# Patient Record
Sex: Male | Born: 1935 | Race: Black or African American | Hispanic: No | Marital: Single | Smoking: Former smoker
Health system: Southern US, Community
[De-identification: ages and names within clinical notes are randomized; demographics above are authoritative.]

## PROBLEM LIST (undated history)

## (undated) DIAGNOSIS — I4891 Unspecified atrial fibrillation: Secondary | ICD-10-CM

## (undated) DIAGNOSIS — J449 Chronic obstructive pulmonary disease, unspecified: Secondary | ICD-10-CM

## (undated) DIAGNOSIS — K08109 Complete loss of teeth, unspecified cause, unspecified class: Secondary | ICD-10-CM

## (undated) DIAGNOSIS — IMO0001 Reserved for inherently not codable concepts without codable children: Secondary | ICD-10-CM

## (undated) DIAGNOSIS — N184 Chronic kidney disease, stage 4 (severe): Secondary | ICD-10-CM

## (undated) DIAGNOSIS — I2510 Atherosclerotic heart disease of native coronary artery without angina pectoris: Secondary | ICD-10-CM

## (undated) DIAGNOSIS — I5042 Chronic combined systolic (congestive) and diastolic (congestive) heart failure: Secondary | ICD-10-CM

## (undated) DIAGNOSIS — M1990 Unspecified osteoarthritis, unspecified site: Secondary | ICD-10-CM

## (undated) DIAGNOSIS — E119 Type 2 diabetes mellitus without complications: Secondary | ICD-10-CM

## (undated) DIAGNOSIS — N189 Chronic kidney disease, unspecified: Secondary | ICD-10-CM

## (undated) DIAGNOSIS — I219 Acute myocardial infarction, unspecified: Secondary | ICD-10-CM

## (undated) DIAGNOSIS — I499 Cardiac arrhythmia, unspecified: Secondary | ICD-10-CM

## (undated) DIAGNOSIS — I10 Essential (primary) hypertension: Secondary | ICD-10-CM

## (undated) DIAGNOSIS — Z862 Personal history of diseases of the blood and blood-forming organs and certain disorders involving the immune mechanism: Secondary | ICD-10-CM

## (undated) DIAGNOSIS — R609 Edema, unspecified: Secondary | ICD-10-CM

## (undated) DIAGNOSIS — M109 Gout, unspecified: Secondary | ICD-10-CM

## (undated) DIAGNOSIS — E7800 Pure hypercholesterolemia, unspecified: Secondary | ICD-10-CM

## (undated) DIAGNOSIS — Z972 Presence of dental prosthetic device (complete) (partial): Secondary | ICD-10-CM

## (undated) DIAGNOSIS — J45909 Unspecified asthma, uncomplicated: Secondary | ICD-10-CM

## (undated) HISTORY — PX: CARDIAC CATHETERIZATION: SHX172

## (undated) HISTORY — PX: RETINAL DETACHMENT SURGERY: SHX105

---

## 2006-11-16 ENCOUNTER — Inpatient Hospital Stay: Payer: Self-pay | Admitting: Internal Medicine

## 2006-11-16 ENCOUNTER — Other Ambulatory Visit: Payer: Self-pay

## 2007-08-02 ENCOUNTER — Inpatient Hospital Stay: Payer: Self-pay | Admitting: Internal Medicine

## 2007-08-02 ENCOUNTER — Other Ambulatory Visit: Payer: Self-pay

## 2007-12-14 ENCOUNTER — Ambulatory Visit: Payer: Self-pay | Admitting: Gastroenterology

## 2007-12-19 ENCOUNTER — Ambulatory Visit: Payer: Self-pay | Admitting: Gastroenterology

## 2008-01-12 ENCOUNTER — Inpatient Hospital Stay: Payer: Self-pay | Admitting: Internal Medicine

## 2008-01-12 ENCOUNTER — Other Ambulatory Visit: Payer: Self-pay

## 2008-03-19 ENCOUNTER — Ambulatory Visit: Payer: Self-pay | Admitting: Family Medicine

## 2008-03-27 ENCOUNTER — Emergency Department: Payer: Self-pay | Admitting: Emergency Medicine

## 2008-03-27 ENCOUNTER — Other Ambulatory Visit: Payer: Self-pay

## 2008-05-26 ENCOUNTER — Other Ambulatory Visit: Payer: Self-pay

## 2008-05-26 ENCOUNTER — Inpatient Hospital Stay: Payer: Self-pay | Admitting: Internal Medicine

## 2008-12-15 ENCOUNTER — Inpatient Hospital Stay: Payer: Self-pay | Admitting: Internal Medicine

## 2008-12-18 ENCOUNTER — Ambulatory Visit: Payer: Self-pay | Admitting: Cardiology

## 2009-01-08 ENCOUNTER — Inpatient Hospital Stay: Payer: Self-pay | Admitting: *Deleted

## 2009-03-06 ENCOUNTER — Ambulatory Visit: Payer: Self-pay | Admitting: Family Medicine

## 2009-05-26 ENCOUNTER — Ambulatory Visit: Payer: Self-pay | Admitting: Family Medicine

## 2012-07-18 ENCOUNTER — Ambulatory Visit: Payer: Self-pay | Admitting: Family Medicine

## 2013-09-07 ENCOUNTER — Emergency Department: Payer: Self-pay | Admitting: Emergency Medicine

## 2013-10-18 ENCOUNTER — Ambulatory Visit: Payer: Self-pay | Admitting: Ophthalmology

## 2013-10-18 LAB — POTASSIUM: Potassium: 4.2 mmol/L (ref 3.5–5.1)

## 2013-10-24 ENCOUNTER — Ambulatory Visit: Payer: Self-pay | Admitting: Ophthalmology

## 2015-01-04 NOTE — Op Note (Signed)
PATIENT NAME:  [name] [name], [name] [name] MR#:  [id number] DATE OF BIRTH:  Apr 28, 1935  DATE OF PROCEDURE:  10/24/2013  PROCEDURES PERFORMED: 1.  Pars plana vitrectomy of the right eye.  2.  Panretinal photocoagulation of the right eye.   PREOPERATIVE DIAGNOSIS: 1.  Dense vitreous hemorrhage.   POSTOPERATIVE DIAGNOSES: 1.  Dense vitreous hemorrhage.  2.  Branch retinal vein occlusion.  3.  Proliferative retinopathy.   PRIMARY SURGEON:  Aron Baba M.D.   ANESTHESIA: Retrobulbar block of the right eye with monitored anesthesia care.   COMPLICATIONS: None.   ESTIMATED BLOOD LOSS: Less than 1 mL.   INDICATIONS FOR PROCEDURE: The patient presented to my office with sudden decreased vision in the right eye. Examination revealed a dense vitreous hemorrhage. B-scan ultrasound showed no signs of retinal detachment. Risks, benefits and alternatives of the above procedure were discussed, and the patient wished to proceed.   DETAILS OF PROCEDURE: After informed consent was obtained, the patient was brought into the operative suite at [facility]. The patient was placed in supine position and was given a small dose of Alfenta and a retrobulbar block was performed on the right eye by the primary surgeon without any complications. The right eye was prepped and draped in a sterile manner. After lid speculum was inserted, a 25-gauge trocar was placed inferotemporally through displaced conjunctiva in an oblique fashion 4 mm beyond the limbus. The infusion cannula was turned on and inserted through the trocar and secured in position with Steri-Strips. Two more trocars were placed in a similar fashion superotemporally and superonasally. The vitreous cutter and light pipe were introduced in the eye and a core vitrectomy was performed. The posterior retinal heme was vacuumed and peripheral vitreous was trimmed for 360 degrees. Extreme care was taken to avoid hitting the crystalline lens. After  all the blood was cleared, the area of proliferative retinopathy was identified at approximately 11:30.  Multiple branch vessels were noted to be occluded. The endolaser was introduced and 360 degrees of panretinal photocoagulation was performed. No signs of any breaks, tears or retinal detachment could be identified on examination for 360 degrees. A partial air-fluid exchange was performed. Each of the trocars were removed. The superotemporal wound was closed using transconjunctival 6-0 plain gut. The eye was pressurized with filtered air to a pressure of 15 mmHg; 5 mg of dexamethasone was given into the inferior fornix. Each of the wound sites were noted to be airtight. The lid speculum was removed and the eye was cleaned. TobraDex was placed on the eye and a patch and shield were placed over the eye. The patient was taken to postanesthesia care with instructions to remain head up.      ____________________________ Ignacia Felling. Esperanza Madrazo, MD mfa:dmm D: 10/24/2013 09:11:00 ET T: 10/24/2013 09:34:30 ET JOB#: [id number]  cc: Ignacia Felling. Champ Mungo, MD, <Dictator> Cline Cools MD ELECTRONICALLY SIGNED 11/17/2013 8:59

## 2016-01-22 NOTE — Discharge Instructions (Signed)

## 2016-01-28 ENCOUNTER — Ambulatory Visit: Payer: Medicare Other | Admitting: Anesthesiology

## 2016-01-28 ENCOUNTER — Ambulatory Visit
Admission: RE | Admit: 2016-01-28 | Discharge: 2016-01-28 | Disposition: A | Discharge status: Home / Self Care | Payer: Medicare Other | Source: Ambulatory Visit | Attending: Ophthalmology | Admitting: Ophthalmology

## 2016-01-28 ENCOUNTER — Encounter
Admission: RE | Disposition: A | Discharge status: Home / Self Care | Payer: Self-pay | Source: Ambulatory Visit | Attending: Ophthalmology

## 2016-01-28 DIAGNOSIS — I2510 Atherosclerotic heart disease of native coronary artery without angina pectoris: Secondary | ICD-10-CM | POA: Diagnosis not present

## 2016-01-28 DIAGNOSIS — I252 Old myocardial infarction: Secondary | ICD-10-CM | POA: Diagnosis not present

## 2016-01-28 DIAGNOSIS — H2511 Age-related nuclear cataract, right eye: Secondary | ICD-10-CM | POA: Diagnosis not present

## 2016-01-28 DIAGNOSIS — M109 Gout, unspecified: Secondary | ICD-10-CM | POA: Insufficient documentation

## 2016-01-28 DIAGNOSIS — Z79899 Other long term (current) drug therapy: Secondary | ICD-10-CM | POA: Diagnosis not present

## 2016-01-28 DIAGNOSIS — E119 Type 2 diabetes mellitus without complications: Secondary | ICD-10-CM | POA: Diagnosis not present

## 2016-01-28 DIAGNOSIS — E7800 Pure hypercholesterolemia, unspecified: Secondary | ICD-10-CM | POA: Diagnosis not present

## 2016-01-28 DIAGNOSIS — I499 Cardiac arrhythmia, unspecified: Secondary | ICD-10-CM | POA: Insufficient documentation

## 2016-01-28 DIAGNOSIS — I10 Essential (primary) hypertension: Secondary | ICD-10-CM | POA: Insufficient documentation

## 2016-01-28 DIAGNOSIS — I4891 Unspecified atrial fibrillation: Secondary | ICD-10-CM | POA: Diagnosis not present

## 2016-01-28 DIAGNOSIS — Z955 Presence of coronary angioplasty implant and graft: Secondary | ICD-10-CM | POA: Diagnosis not present

## 2016-01-28 DIAGNOSIS — J449 Chronic obstructive pulmonary disease, unspecified: Secondary | ICD-10-CM | POA: Diagnosis not present

## 2016-01-28 HISTORY — DX: Essential (primary) hypertension: I10

## 2016-01-28 HISTORY — DX: Chronic kidney disease, unspecified: N18.9

## 2016-01-28 HISTORY — DX: Type 2 diabetes mellitus without complications: E11.9

## 2016-01-28 HISTORY — PX: CATARACT EXTRACTION W/PHACO: SHX586

## 2016-01-28 HISTORY — DX: Unspecified osteoarthritis, unspecified site: M19.90

## 2016-01-28 HISTORY — DX: Chronic obstructive pulmonary disease, unspecified: J44.9

## 2016-01-28 HISTORY — DX: Edema, unspecified: R60.9

## 2016-01-28 HISTORY — DX: Cardiac arrhythmia, unspecified: I49.9

## 2016-01-28 HISTORY — DX: Unspecified asthma, uncomplicated: J45.909

## 2016-01-28 HISTORY — DX: Unspecified atrial fibrillation: I48.91

## 2016-01-28 HISTORY — DX: Atherosclerotic heart disease of native coronary artery without angina pectoris: I25.10

## 2016-01-28 HISTORY — DX: Presence of dental prosthetic device (complete) (partial): Z97.2

## 2016-01-28 HISTORY — DX: Acute myocardial infarction, unspecified: I21.9

## 2016-01-28 HISTORY — DX: Reserved for inherently not codable concepts without codable children: IMO0001

## 2016-01-28 HISTORY — DX: Complete loss of teeth, unspecified cause, unspecified class: K08.109

## 2016-01-28 HISTORY — DX: Pure hypercholesterolemia, unspecified: E78.00

## 2016-01-28 LAB — GLUCOSE, CAPILLARY
GLUCOSE-CAPILLARY: 70 mg/dL (ref 65–99)
Glucose-Capillary: 105 mg/dL — ABNORMAL HIGH (ref 65–99)

## 2016-01-28 SURGERY — PHACOEMULSIFICATION, CATARACT, WITH IOL INSERTION
Anesthesia: Monitor Anesthesia Care | Laterality: Right | Wound class: Clean

## 2016-01-28 MED ORDER — POVIDONE-IODINE 5 % OP SOLN: 1.0000 "application " | OPHTHALMIC | Status: DC | PRN

## 2016-01-28 MED ORDER — BRIMONIDINE TARTRATE 0.2 % OP SOLN
OPHTHALMIC | Status: DC | PRN
  Administered 2016-01-28: 1 [drp] via OPHTHALMIC

## 2016-01-28 MED ORDER — ARMC OPHTHALMIC DILATING GEL
1.0000 "application " | OPHTHALMIC | Status: DC | PRN
  Administered 2016-01-28 (×2): 1 via OPHTHALMIC

## 2016-01-28 MED ORDER — BSS IO SOLN
INTRAOCULAR | Status: DC | PRN
  Administered 2016-01-28: 82 mL via OPHTHALMIC
  Administered 2016-01-28: 10:00:00 via OPHTHALMIC

## 2016-01-28 MED ORDER — POVIDONE-IODINE 5 % OP SOLN
1.0000 "application " | OPHTHALMIC | Status: DC | PRN
  Administered 2016-01-28: 1 via OPHTHALMIC

## 2016-01-28 MED ORDER — TETRACAINE HCL 0.5 % OP SOLN: 1.0000 [drp] | OPHTHALMIC | Status: DC | PRN

## 2016-01-28 MED ORDER — DEXTROSE 50 % IV SOLN
12.5000 g | Freq: Once | INTRAVENOUS | Status: AC
  Administered 2016-01-28: 12.5 g via INTRAVENOUS

## 2016-01-28 MED ORDER — TETRACAINE HCL 0.5 % OP SOLN
1.0000 [drp] | OPHTHALMIC | Status: DC | PRN
  Administered 2016-01-28: 1 [drp] via OPHTHALMIC

## 2016-01-28 MED ORDER — MIDAZOLAM HCL 2 MG/2ML IJ SOLN
INTRAMUSCULAR | Status: DC | PRN
  Administered 2016-01-28: 1 mg via INTRAVENOUS

## 2016-01-28 MED ORDER — NA HYALUR & NA CHOND-NA HYALUR 0.4-0.35 ML IO KIT
PACK | INTRAOCULAR | Status: DC | PRN
Start: 2016-01-28 — End: 2016-01-28
  Administered 2016-01-28: 1 mL via INTRAOCULAR

## 2016-01-28 MED ORDER — CEFUROXIME OPHTHALMIC INJECTION 1 MG/0.1 ML
INJECTION | OPHTHALMIC | Status: DC | PRN
  Administered 2016-01-28: 0.1 mL via OPHTHALMIC

## 2016-01-28 MED ORDER — ARMC OPHTHALMIC DILATING GEL: 1.0000 "application " | OPHTHALMIC | Status: DC | PRN

## 2016-01-28 MED ORDER — TIMOLOL MALEATE 0.5 % OP SOLN
OPHTHALMIC | Status: DC | PRN
  Administered 2016-01-28: 1 [drp] via OPHTHALMIC

## 2016-01-28 MED ORDER — BALANCED SALT IO SOLN
INTRAOCULAR | Status: DC | PRN
  Administered 2016-01-28: 1 mL via OPHTHALMIC

## 2016-01-28 MED ORDER — FENTANYL CITRATE (PF) 100 MCG/2ML IJ SOLN
INTRAMUSCULAR | Status: DC | PRN
  Administered 2016-01-28: 50 ug via INTRAVENOUS

## 2016-01-28 SURGICAL SUPPLY — 21 items
CANNULA ANT/CHMB 27GA (MISCELLANEOUS) ×3 IMPLANT
CARTRIDGE ABBOTT (MISCELLANEOUS) IMPLANT
GLOVE SURG LX 7.5 STRW (GLOVE) ×2
GLOVE SURG LX STRL 7.5 STRW (GLOVE) ×1 IMPLANT
GLOVE SURG TRIUMPH 8.0 PF LTX (GLOVE) ×3 IMPLANT
GOWN STRL REUS W/ TWL LRG LVL3 (GOWN DISPOSABLE) ×2 IMPLANT
GOWN STRL REUS W/TWL LRG LVL3 (GOWN DISPOSABLE) ×4
LENS IOL TECNIS ITEC 22.5 (Intraocular Lens) ×3 IMPLANT
MARKER SKIN DUAL TIP RULER LAB (MISCELLANEOUS) ×3 IMPLANT
NDL RETROBULBAR .5 NSTRL (NEEDLE) IMPLANT
PACK CATARACT BRASINGTON (MISCELLANEOUS) ×3 IMPLANT
PACK EYE AFTER SURG (MISCELLANEOUS) ×3 IMPLANT
PACK OPTHALMIC (MISCELLANEOUS) ×3 IMPLANT
RING MALYGIN 7.0 (MISCELLANEOUS) ×3 IMPLANT
SUT ETHILON 10-0 CS-B-6CS-B-6 (SUTURE)
SUT VICRYL  9 0 (SUTURE)
SUT VICRYL 9 0 (SUTURE) IMPLANT
SUTURE EHLN 10-0 CS-B-6CS-B-6 (SUTURE) IMPLANT
SYR TB 1ML LUER SLIP (SYRINGE) ×3 IMPLANT
WATER STERILE IRR 250ML POUR (IV SOLUTION) ×3 IMPLANT
WIPE NON LINTING 3.25X3.25 (MISCELLANEOUS) ×3 IMPLANT

## 2016-01-28 NOTE — Op Note (Signed)
OPERATIVE NOTE  [name] [name] [id number] 01/28/2016   PREOPERATIVE DIAGNOSIS:    Nuclear Sclerotic Cataract Right eye with miotic pupil.        H25.11  POSTOPERATIVE DIAGNOSIS: Nuclear Sclerotic Cataract Right eye with miotic pupil.          PROCEDURE:  Phacoemusification with posterior chamber intraocular lens placement of the right eye which required pupil stretching with the Malyugin pupil expansion device.  LENS:   Implant Name Type Inv. Item Serial No. Manufacturer Lot No. LRB No. Used  LENS IOL DIOP 22.5 - [id number] Intraocular Lens LENS IOL DIOP 22.5 [id number]   Right 1       ULTRASOUND TIME: 20 % of 2 minutes 11 seconds, CDE 26.2  SURGEON:  Deirdre Evener, MD   ANESTHESIA:  Topical with tetracaine drops and 2% Xylocaine jelly, augmented with 1% preservative-free intracameral lidocaine.   COMPLICATIONS:  None.   DESCRIPTION OF PROCEDURE:  The patient was identified in the holding room and transported to the operating room and placed in the supine position under the operating microscope. Theright eye was identified as the operative eye and it was prepped and draped in the usual sterile ophthalmic fashion.   A 1 millimeter clear-corneal paracentesis was made at the 12:00 position.  0.5 ml of preservative-free 1% lidocaine was injected into the anterior chamber. The anterior chamber was filled with Viscoat viscoelastic.  A 2.4 millimeter keratome was used to make a near-clear corneal incision at the 9:00 position. A Malyugin pupil expander was then placed through the main incision and into the anterior chamber of the eye.  The edge of the iris was secured on the lip of the pupil expander and it was released, thereby expanding the pupil to approximately 8 millimeters for completion of the cataract surgery.  Additional Viscoat was placed in the anterior chamber.  A cystotome and capsulorrhexis forceps were used to make a curvilinear capsulorrhexis.   Balanced salt  solution was used to hydrodissect and hydrodelineate the lens nucleus.   Phacoemulsification was used in stop and chop fashion to remove the lens, nucleus and epinucleus.  The remaining cortex was aspirated using the irrigation aspiration handpiece.  Additional Provisc was placed into the eye to distend the capsular bag for lens placement.  A lens was then injected into the capsular bag.  The pupil expanding ring was removed using a Kuglen hook and insertion device. The remaining viscoelastic was aspirated from the capsular bag and the anterior chamber.  The anterior chamber was filled with balanced salt solution to inflate to a physiologic pressure.  Wounds were hydrated with balanced salt solution.  The anterior chamber was inflated to a physiologic pressure with balanced salt solution.  No wound leaks were noted.Cefuroxime 0.1 ml of a 10mg /ml solution was injected into the anterior chamber for a dose of 1 mg of intracameral antibiotic at the completion of the case. Timolol and Brimonidine drops were applied to the eye.  The patient was taken to the recovery room in stable condition without complications of anesthesia or surgery.  [name] 01/28/2016, 10:21 AM

## 2016-01-28 NOTE — Transfer of Care (Signed)
Immediate Anesthesia Transfer of Care Note  Patient: [name] [name]  Procedure(s) Performed: Procedure(s) with comments: CATARACT EXTRACTION PHACO AND INTRAOCULAR LENS PLACEMENT (IOC) right eye (Right) - DIABETIC-oral med MALYUGIN  Patient Location: PACU  Anesthesia Type: MAC  Level of Consciousness: awake, alert  and patient cooperative  Airway and Oxygen Therapy: Patient Spontanous Breathing and Patient connected to supplemental oxygen  Post-op Assessment: Post-op Vital signs reviewed, Patient's Cardiovascular Status Stable, Respiratory Function Stable, Patent Airway and No signs of Nausea or vomiting  Post-op Vital Signs: Reviewed and stable  Complications: No apparent anesthesia complications

## 2016-01-28 NOTE — Anesthesia Preprocedure Evaluation (Signed)
Anesthesia Evaluation  Patient identified by MRN, date of birth, ID band Patient awake    Reviewed: Allergy & Precautions, H&P , NPO status , Patient's Chart, lab work & pertinent test results  Airway Mallampati: II  TM Distance: >3 FB Neck ROM: full    Dental   Pulmonary shortness of breath, asthma , COPD, former smoker,    breath sounds clear to auscultation       Cardiovascular hypertension, + CAD and + Past MI  + dysrhythmias  Rhythm:irregular     Neuro/Psych    GI/Hepatic   Endo/Other  diabetes  Renal/GU Renal disease     Musculoskeletal   Abdominal   Peds  Hematology   Anesthesia Other Findings   Reproductive/Obstetrics                             Anesthesia Physical Anesthesia Plan  ASA: III  Anesthesia Plan: MAC   Post-op Pain Management:    Induction:   Airway Management Planned:   Additional Equipment:   Intra-op Plan:   Post-operative Plan:   Informed Consent: I have reviewed the patients History and Physical, chart, labs and discussed the procedure including the risks, benefits and alternatives for the proposed anesthesia with the patient or authorized representative who has indicated his/her understanding and acceptance.     Plan Discussed with: CRNA  Anesthesia Plan Comments:         Anesthesia Quick Evaluation

## 2016-01-28 NOTE — Anesthesia Procedure Notes (Signed)
Procedure Name: MAC Performed by: Keyvon Herter Pre-anesthesia Checklist: Patient identified, Emergency Drugs available, Suction available, Timeout performed and Patient being monitored Patient Re-evaluated:Patient Re-evaluated prior to inductionOxygen Delivery Method: Nasal cannula Placement Confirmation: positive ETCO2       

## 2016-01-28 NOTE — Anesthesia Postprocedure Evaluation (Signed)
Anesthesia Post Note  Patient: [name] [name]  Procedure(s) Performed: Procedure(s) (LRB): CATARACT EXTRACTION PHACO AND INTRAOCULAR LENS PLACEMENT (IOC) right eye (Right)  Patient location during evaluation: PACU Anesthesia Type: MAC Level of consciousness: awake and alert Pain management: pain level controlled Vital Signs Assessment: post-procedure vital signs reviewed and stable Respiratory status: spontaneous breathing, nonlabored ventilation, respiratory function stable and patient connected to nasal cannula oxygen Cardiovascular status: stable and blood pressure returned to baseline Anesthetic complications: no    Durene Fruits

## 2016-01-28 NOTE — H&P (Signed)
  The History and Physical notes are on paper, have been signed, and are to be scanned. The patient remains stable and unchanged from the H&P.   Previous H&P reviewed, patient examined, and there are no changes.  Linh Hedberg 01/28/2016 9:06 AM

## 2016-01-29 ENCOUNTER — Encounter: Payer: Self-pay | Admitting: Ophthalmology

## 2019-08-28 ENCOUNTER — Emergency Department: Payer: Medicare Other

## 2019-08-28 ENCOUNTER — Emergency Department
Admission: EM | Admit: 2019-08-28 | Discharge: 2019-08-28 | Disposition: A | Discharge status: Other Acute Inpatient Hospital | Payer: Medicare Other | Attending: Emergency Medicine | Admitting: Emergency Medicine

## 2019-08-28 ENCOUNTER — Inpatient Hospital Stay (HOSPITAL_COMMUNITY)
Admission: EM | Admit: 2019-08-28 | Discharge: 2019-09-06 | DRG: 286 | Disposition: A | Discharge status: Home / Self Care | Payer: Medicare Other | Source: Other Acute Inpatient Hospital | Attending: Internal Medicine | Admitting: Internal Medicine

## 2019-08-28 ENCOUNTER — Other Ambulatory Visit: Payer: Self-pay

## 2019-08-28 DIAGNOSIS — I2510 Atherosclerotic heart disease of native coronary artery without angina pectoris: Secondary | ICD-10-CM | POA: Diagnosis present

## 2019-08-28 DIAGNOSIS — T68XXXA Hypothermia, initial encounter: Secondary | ICD-10-CM

## 2019-08-28 DIAGNOSIS — Z7984 Long term (current) use of oral hypoglycemic drugs: Secondary | ICD-10-CM

## 2019-08-28 DIAGNOSIS — F17220 Nicotine dependence, chewing tobacco, uncomplicated: Secondary | ICD-10-CM | POA: Insufficient documentation

## 2019-08-28 DIAGNOSIS — E7800 Pure hypercholesterolemia, unspecified: Secondary | ICD-10-CM | POA: Diagnosis present

## 2019-08-28 DIAGNOSIS — Z7902 Long term (current) use of antithrombotics/antiplatelets: Secondary | ICD-10-CM | POA: Insufficient documentation

## 2019-08-28 DIAGNOSIS — I129 Hypertensive chronic kidney disease with stage 1 through stage 4 chronic kidney disease, or unspecified chronic kidney disease: Secondary | ICD-10-CM | POA: Insufficient documentation

## 2019-08-28 DIAGNOSIS — N183 Chronic kidney disease, stage 3 unspecified: Secondary | ICD-10-CM | POA: Diagnosis not present

## 2019-08-28 DIAGNOSIS — I4891 Unspecified atrial fibrillation: Secondary | ICD-10-CM

## 2019-08-28 DIAGNOSIS — J449 Chronic obstructive pulmonary disease, unspecified: Secondary | ICD-10-CM | POA: Insufficient documentation

## 2019-08-28 DIAGNOSIS — J9601 Acute respiratory failure with hypoxia: Secondary | ICD-10-CM

## 2019-08-28 DIAGNOSIS — E1122 Type 2 diabetes mellitus with diabetic chronic kidney disease: Secondary | ICD-10-CM | POA: Insufficient documentation

## 2019-08-28 DIAGNOSIS — I4510 Unspecified right bundle-branch block: Secondary | ICD-10-CM | POA: Diagnosis present

## 2019-08-28 DIAGNOSIS — R64 Cachexia: Secondary | ICD-10-CM | POA: Diagnosis present

## 2019-08-28 DIAGNOSIS — E872 Acidosis: Secondary | ICD-10-CM | POA: Diagnosis present

## 2019-08-28 DIAGNOSIS — Z87891 Personal history of nicotine dependence: Secondary | ICD-10-CM

## 2019-08-28 DIAGNOSIS — I5082 Biventricular heart failure: Secondary | ICD-10-CM | POA: Diagnosis not present

## 2019-08-28 DIAGNOSIS — E876 Hypokalemia: Secondary | ICD-10-CM | POA: Diagnosis present

## 2019-08-28 DIAGNOSIS — I130 Hypertensive heart and chronic kidney disease with heart failure and stage 1 through stage 4 chronic kidney disease, or unspecified chronic kidney disease: Secondary | ICD-10-CM | POA: Diagnosis present

## 2019-08-28 DIAGNOSIS — I259 Chronic ischemic heart disease, unspecified: Secondary | ICD-10-CM | POA: Diagnosis not present

## 2019-08-28 DIAGNOSIS — R680 Hypothermia, not associated with low environmental temperature: Secondary | ICD-10-CM | POA: Insufficient documentation

## 2019-08-28 DIAGNOSIS — Z7982 Long term (current) use of aspirin: Secondary | ICD-10-CM | POA: Diagnosis not present

## 2019-08-28 DIAGNOSIS — Z20828 Contact with and (suspected) exposure to other viral communicable diseases: Secondary | ICD-10-CM | POA: Insufficient documentation

## 2019-08-28 DIAGNOSIS — I252 Old myocardial infarction: Secondary | ICD-10-CM

## 2019-08-28 DIAGNOSIS — A419 Sepsis, unspecified organism: Secondary | ICD-10-CM | POA: Insufficient documentation

## 2019-08-28 DIAGNOSIS — I472 Ventricular tachycardia: Secondary | ICD-10-CM | POA: Diagnosis present

## 2019-08-28 DIAGNOSIS — I361 Nonrheumatic tricuspid (valve) insufficiency: Secondary | ICD-10-CM | POA: Diagnosis not present

## 2019-08-28 DIAGNOSIS — I428 Other cardiomyopathies: Secondary | ICD-10-CM | POA: Diagnosis present

## 2019-08-28 DIAGNOSIS — I340 Nonrheumatic mitral (valve) insufficiency: Secondary | ICD-10-CM | POA: Diagnosis not present

## 2019-08-28 DIAGNOSIS — I4892 Unspecified atrial flutter: Secondary | ICD-10-CM

## 2019-08-28 DIAGNOSIS — R4182 Altered mental status, unspecified: Secondary | ICD-10-CM | POA: Insufficient documentation

## 2019-08-28 DIAGNOSIS — N179 Acute kidney failure, unspecified: Secondary | ICD-10-CM

## 2019-08-28 DIAGNOSIS — Z95828 Presence of other vascular implants and grafts: Secondary | ICD-10-CM

## 2019-08-28 DIAGNOSIS — I471 Supraventricular tachycardia: Secondary | ICD-10-CM | POA: Diagnosis present

## 2019-08-28 DIAGNOSIS — R570 Cardiogenic shock: Secondary | ICD-10-CM | POA: Diagnosis present

## 2019-08-28 DIAGNOSIS — I5043 Acute on chronic combined systolic (congestive) and diastolic (congestive) heart failure: Secondary | ICD-10-CM | POA: Diagnosis present

## 2019-08-28 DIAGNOSIS — N1832 Chronic kidney disease, stage 3b: Secondary | ICD-10-CM

## 2019-08-28 DIAGNOSIS — Z79899 Other long term (current) drug therapy: Secondary | ICD-10-CM | POA: Diagnosis not present

## 2019-08-28 DIAGNOSIS — N170 Acute kidney failure with tubular necrosis: Secondary | ICD-10-CM | POA: Diagnosis present

## 2019-08-28 DIAGNOSIS — F17211 Nicotine dependence, cigarettes, in remission: Secondary | ICD-10-CM | POA: Diagnosis not present

## 2019-08-28 DIAGNOSIS — M19019 Primary osteoarthritis, unspecified shoulder: Secondary | ICD-10-CM | POA: Diagnosis present

## 2019-08-28 DIAGNOSIS — Z8673 Personal history of transient ischemic attack (TIA), and cerebral infarction without residual deficits: Secondary | ICD-10-CM

## 2019-08-28 DIAGNOSIS — N1831 Chronic kidney disease, stage 3a: Secondary | ICD-10-CM | POA: Diagnosis present

## 2019-08-28 DIAGNOSIS — R0602 Shortness of breath: Secondary | ICD-10-CM | POA: Diagnosis present

## 2019-08-28 DIAGNOSIS — I119 Hypertensive heart disease without heart failure: Secondary | ICD-10-CM | POA: Insufficient documentation

## 2019-08-28 DIAGNOSIS — Z6821 Body mass index (BMI) 21.0-21.9, adult: Secondary | ICD-10-CM

## 2019-08-28 DIAGNOSIS — I5021 Acute systolic (congestive) heart failure: Secondary | ICD-10-CM | POA: Diagnosis not present

## 2019-08-28 DIAGNOSIS — E440 Moderate protein-calorie malnutrition: Secondary | ICD-10-CM | POA: Insufficient documentation

## 2019-08-28 DIAGNOSIS — R000 Tachycardia, unspecified: Secondary | ICD-10-CM | POA: Diagnosis not present

## 2019-08-28 DIAGNOSIS — I480 Paroxysmal atrial fibrillation: Secondary | ICD-10-CM | POA: Diagnosis present

## 2019-08-28 LAB — CBC WITH DIFFERENTIAL/PLATELET
Abs Immature Granulocytes: 0.11 10*3/uL — ABNORMAL HIGH (ref 0.00–0.07)
Basophils Absolute: 0 10*3/uL (ref 0.0–0.1)
Basophils Relative: 0 %
Eosinophils Absolute: 0 10*3/uL (ref 0.0–0.5)
Eosinophils Relative: 0 %
HCT: 31.7 % — ABNORMAL LOW (ref 39.0–52.0)
Hemoglobin: 10.6 g/dL — ABNORMAL LOW (ref 13.0–17.0)
Immature Granulocytes: 1 %
Lymphocytes Relative: 16 %
Lymphs Abs: 1.7 10*3/uL (ref 0.7–4.0)
MCH: 28.6 pg (ref 26.0–34.0)
MCHC: 33.4 g/dL (ref 30.0–36.0)
MCV: 85.7 fL (ref 80.0–100.0)
Monocytes Absolute: 0.7 10*3/uL (ref 0.1–1.0)
Monocytes Relative: 6 %
Neutro Abs: 8.4 10*3/uL — ABNORMAL HIGH (ref 1.7–7.7)
Neutrophils Relative %: 77 %
Platelets: 184 10*3/uL (ref 150–400)
RBC: 3.7 MIL/uL — ABNORMAL LOW (ref 4.22–5.81)
RDW: 16 % — ABNORMAL HIGH (ref 11.5–15.5)
WBC: 10.9 10*3/uL — ABNORMAL HIGH (ref 4.0–10.5)
nRBC: 0 % (ref 0.0–0.2)

## 2019-08-28 LAB — BASIC METABOLIC PANEL
Anion gap: 18 — ABNORMAL HIGH (ref 5–15)
BUN: 33 mg/dL — ABNORMAL HIGH (ref 8–23)
CO2: 11 mmol/L — ABNORMAL LOW (ref 22–32)
Calcium: 8.2 mg/dL — ABNORMAL LOW (ref 8.9–10.3)
Chloride: 111 mmol/L (ref 98–111)
Creatinine, Ser: 3.32 mg/dL — ABNORMAL HIGH (ref 0.61–1.24)
GFR calc Af Amer: 19 mL/min — ABNORMAL LOW (ref >60)
GFR calc non Af Amer: 16 mL/min — ABNORMAL LOW (ref >60)
Glucose, Bld: 143 mg/dL — ABNORMAL HIGH (ref 70–99)
Potassium: 5.2 mmol/L — ABNORMAL HIGH (ref 3.5–5.1)
Sodium: 140 mmol/L (ref 135–145)

## 2019-08-28 LAB — BLOOD GAS, ARTERIAL
Acid-base deficit: 16.7 mmol/L — ABNORMAL HIGH (ref 0.0–2.0)
Bicarbonate: 8.3 mmol/L — ABNORMAL LOW (ref 20.0–28.0)
FIO2: 0.36
O2 Saturation: 88.4 %
Patient temperature: 37
pCO2 arterial: 19 mmHg — CL (ref 32.0–48.0)
pH, Arterial: 7.25 — ABNORMAL LOW (ref 7.350–7.450)
pO2, Arterial: 65 mmHg — ABNORMAL LOW (ref 83.0–108.0)

## 2019-08-28 LAB — URINALYSIS, ROUTINE W REFLEX MICROSCOPIC
Bacteria, UA: NONE SEEN
Bilirubin Urine: NEGATIVE
Glucose, UA: NEGATIVE mg/dL
Hgb urine dipstick: NEGATIVE
Ketones, ur: NEGATIVE mg/dL
Leukocytes,Ua: NEGATIVE
Nitrite: NEGATIVE
Protein, ur: 100 mg/dL — AB
Specific Gravity, Urine: 1.013 (ref 1.005–1.030)
pH: 5 (ref 5.0–8.0)

## 2019-08-28 LAB — URINE DRUG SCREEN, QUALITATIVE (ARMC ONLY)
Amphetamines, Ur Screen: NOT DETECTED
Barbiturates, Ur Screen: NOT DETECTED
Benzodiazepine, Ur Scrn: NOT DETECTED
Cannabinoid 50 Ng, Ur ~~LOC~~: NOT DETECTED
Cocaine Metabolite,Ur ~~LOC~~: NOT DETECTED
MDMA (Ecstasy)Ur Screen: NOT DETECTED
Methadone Scn, Ur: NOT DETECTED
Opiate, Ur Screen: NOT DETECTED
Phencyclidine (PCP) Ur S: NOT DETECTED
Tricyclic, Ur Screen: NOT DETECTED

## 2019-08-28 LAB — HEPATIC FUNCTION PANEL
ALT: 51 U/L — ABNORMAL HIGH (ref 0–44)
AST: 75 U/L — ABNORMAL HIGH (ref 15–41)
Albumin: 3.4 g/dL — ABNORMAL LOW (ref 3.5–5.0)
Alkaline Phosphatase: 85 U/L (ref 38–126)
Bilirubin, Direct: 0.4 mg/dL — ABNORMAL HIGH (ref 0.0–0.2)
Indirect Bilirubin: 0.7 mg/dL (ref 0.3–0.9)
Total Bilirubin: 1.1 mg/dL (ref 0.3–1.2)
Total Protein: 6.1 g/dL — ABNORMAL LOW (ref 6.5–8.1)

## 2019-08-28 LAB — MAGNESIUM: Magnesium: 2.1 mg/dL (ref 1.7–2.4)

## 2019-08-28 LAB — COOXEMETRY PANEL
Carboxyhemoglobin: 1.2 % (ref 0.5–1.5)
Methemoglobin: 0.7 % (ref 0.0–1.5)
O2 Saturation: 88.4 %
Total oxygen content: 84.7 mL/dL

## 2019-08-28 LAB — TSH: TSH: 1.37 u[IU]/mL (ref 0.350–4.500)

## 2019-08-28 LAB — RESPIRATORY PANEL BY RT PCR (FLU A&B, COVID)
Influenza A by PCR: NEGATIVE
Influenza B by PCR: NEGATIVE
SARS Coronavirus 2 by RT PCR: NEGATIVE

## 2019-08-28 LAB — PROCALCITONIN: Procalcitonin: 0.15 ng/mL

## 2019-08-28 LAB — GLUCOSE, CAPILLARY: Glucose-Capillary: 114 mg/dL — ABNORMAL HIGH (ref 70–99)

## 2019-08-28 LAB — BRAIN NATRIURETIC PEPTIDE: B Natriuretic Peptide: 1337 pg/mL — ABNORMAL HIGH (ref 0.0–100.0)

## 2019-08-28 LAB — TROPONIN I (HIGH SENSITIVITY)
Troponin I (High Sensitivity): 35 ng/L — ABNORMAL HIGH (ref <18)
Troponin I (High Sensitivity): 40 ng/L — ABNORMAL HIGH (ref <18)

## 2019-08-28 LAB — PHOSPHORUS: Phosphorus: 6.3 mg/dL — ABNORMAL HIGH (ref 2.5–4.6)

## 2019-08-28 LAB — T4, FREE: Free T4: 1.38 ng/dL — ABNORMAL HIGH (ref 0.61–1.12)

## 2019-08-28 LAB — LACTIC ACID, PLASMA
Lactic Acid, Venous: 7.8 mmol/L (ref 0.5–1.9)
Lactic Acid, Venous: 7.8 mmol/L (ref 0.5–1.9)

## 2019-08-28 MED ORDER — CALCIUM GLUCONATE 10 % IV SOLN
1.0000 g | Freq: Once | INTRAVENOUS | Status: AC
  Administered 2019-08-28: 1 g via INTRAVENOUS

## 2019-08-28 MED ORDER — HEPARIN (PORCINE) 25000 UT/250ML-% IV SOLN
INTRAVENOUS | Status: AC
  Administered 2019-08-28: 20:00:00 3850 [IU] via INTRAVENOUS
  Filled 2019-08-28: qty 250

## 2019-08-28 MED ORDER — DILTIAZEM HCL 25 MG/5ML IV SOLN
INTRAVENOUS | Status: AC
  Filled 2019-08-28: qty 5

## 2019-08-28 MED ORDER — SODIUM CHLORIDE 0.9 % IV SOLN
2.0000 g | Freq: Once | INTRAVENOUS | Status: AC
  Administered 2019-08-28: 22:00:00 2 g via INTRAVENOUS
  Filled 2019-08-28: qty 2

## 2019-08-28 MED ORDER — VANCOMYCIN HCL IN DEXTROSE 750-5 MG/150ML-% IV SOLN
750.0000 mg | Freq: Once | INTRAVENOUS | Status: DC
  Filled 2019-08-28: qty 150

## 2019-08-28 MED ORDER — HEPARIN (PORCINE) 25000 UT/250ML-% IV SOLN
1050.0000 [IU]/h | INTRAVENOUS | Status: DC
  Administered 2019-08-28: 1050 [IU]/h via INTRAVENOUS

## 2019-08-28 MED ORDER — CALCIUM GLUCONATE 10 % IV SOLN
INTRAVENOUS | Status: AC
  Filled 2019-08-28: qty 10

## 2019-08-28 MED ORDER — HEPARIN BOLUS VIA INFUSION
3850.0000 [IU] | Freq: Once | INTRAVENOUS | Status: AC
  Filled 2019-08-28: qty 3850

## 2019-08-28 MED ORDER — NOREPINEPHRINE BITARTRATE 1 MG/ML IV SOLN
0.0000 ug/min | INTRAVENOUS | Status: DC
  Administered 2019-08-28: 2 ug/min via INTRAVENOUS
  Filled 2019-08-28: qty 4

## 2019-08-28 MED ORDER — METOPROLOL TARTRATE 5 MG/5ML IV SOLN: 5.0000 mg | Freq: Once | INTRAVENOUS | Status: AC

## 2019-08-28 MED ORDER — METOPROLOL TARTRATE 5 MG/5ML IV SOLN
INTRAVENOUS | Status: AC
  Administered 2019-08-28: 20:00:00 5 mg via INTRAVENOUS
  Filled 2019-08-28: qty 5

## 2019-08-28 MED ORDER — NOREPINEPHRINE 4 MG/250ML-% IV SOLN
0.0000 ug/min | INTRAVENOUS | Status: DC
  Filled 2019-08-28: qty 250

## 2019-08-28 MED ORDER — AMIODARONE LOAD VIA INFUSION
150.0000 mg | Freq: Once | INTRAVENOUS | Status: DC
  Filled 2019-08-28: qty 83.34

## 2019-08-28 MED ORDER — AMIODARONE HCL IN DEXTROSE 360-4.14 MG/200ML-% IV SOLN
60.0000 mg/h | INTRAVENOUS | Status: DC
  Filled 2019-08-28: qty 200

## 2019-08-28 MED ORDER — VANCOMYCIN HCL IN DEXTROSE 1-5 GM/200ML-% IV SOLN
1000.0000 mg | Freq: Once | INTRAVENOUS | Status: AC
  Administered 2019-08-28: 23:00:00 1000 mg via INTRAVENOUS
  Filled 2019-08-28: qty 200

## 2019-08-28 MED ORDER — METRONIDAZOLE IN NACL 5-0.79 MG/ML-% IV SOLN
500.0000 mg | Freq: Once | INTRAVENOUS | Status: AC
  Administered 2019-08-28: 22:00:00 500 mg via INTRAVENOUS
  Filled 2019-08-28: qty 100

## 2019-08-28 MED ORDER — AMIODARONE HCL IN DEXTROSE 360-4.14 MG/200ML-% IV SOLN: 30.0000 mg/h | INTRAVENOUS | Status: DC

## 2019-08-28 MED ORDER — DOPAMINE-DEXTROSE 3.2-5 MG/ML-% IV SOLN
0.0000 ug/kg/min | INTRAVENOUS | Status: DC
  Administered 2019-08-28: 5 ug/kg/min via INTRAVENOUS
  Filled 2019-08-28: qty 250

## 2019-08-28 NOTE — Progress Notes (Addendum)
CODE SEPSIS - PHARMACY COMMUNICATION  **Broad Spectrum Antibiotics should be administered within 1 hour of Sepsis diagnosis**  Time Code Sepsis Called/Page Received: 2055  Antibiotics Ordered: vanc/cefepime  Time of 1st antibiotic administration: 2148  Additional action taken by pharmacy:   If necessary, Name of Provider/Nurse Contacted:     Tobie Lords ,PharmD Clinical Pharmacist  08/28/2019  10:10 PM

## 2019-08-28 NOTE — ED Triage Notes (Signed)
Increasing SOB throughout the day, kerosene heater at home and EMS reports heavy kerosene smell.  Hx of afib.  Prior to arrival afib, 196 HR, EMS gave 2 of cardizam, cardiovert at 125 got HR to 150, between 153-176 bpm.  Last BP 93/63.  Bilateral 18g IV, 329mL NS.

## 2019-08-28 NOTE — Consult Note (Signed)
ANTICOAGULATION CONSULT NOTE   Pharmacy Consult for Heparin  Indication: atrial fibrillation  No Known Allergies  Patient Measurements: Height: 5\' 11"  (180.3 cm) Weight: 170 lb (77.1 kg) IBW/kg (Calculated) : 75.3 Heparin Dosing Weight: 77.1 kg   Vital Signs: BP: 166/145 (12/15 1930) Pulse Rate: 170 (12/15 1912)  Labs: Recent Labs    08/28/19 1919  HGB 10.6*  HCT 31.7*  PLT 184    CrCl cannot be calculated (No successful lab value found.).   Medications:  Per chart review, patient is not on PTA anticoagulants. Per nurse, patient is unconscious.   Assessment: Pharmacy has been consulted for heparin management in a patient with atrial fibrillation.    Goal of Therapy:  Heparin level 0.3-0.7 units/ml Monitor platelets by anticoagulation protocol: Yes   Plan:  Baseline labs have been ordered  Heparin DW: 77.1 kg  Give 3850 units bolus x 1 Start heparin infusion at 1050 units/hr Check anti-Xa level in 8 hours and daily while on heparin, per protocol Continue to monitor H&H and platelets   [name] [name] [name] [name] 08/28/2019,7:52 PM

## 2019-08-28 NOTE — Progress Notes (Signed)
PHARMACY -  BRIEF ANTIBIOTIC NOTE   Pharmacy has received consult(s) for Vancomycin ,  Cefepime  from an ED provider.  The patient's profile has been reviewed for ht/wt/allergies/indication/available labs.    One time order(s) placed for  Vancomycin 1750 mg IV X 1 and Cefepime 2 gm IV X 1.   Further antibiotics/pharmacy consults should be ordered by admitting physician if indicated.                       Thank you, Gabrial Poppell D 08/28/2019  9:09 PM

## 2019-08-28 NOTE — ED Notes (Signed)
Patient transported to CT at this time. 

## 2019-08-28 NOTE — ED Provider Notes (Addendum)
[facility] Emergency Department Provider Note  ____________________________________________   First MD Initiated Contact with Patient 08/28/19 1912     (approximate)  I have reviewed the triage vital signs and the nursing notes.   HISTORY  Chief Complaint Shortness of Breath    HPI [name] [name] is a 84 y.o. male with COPD, CKD, diabetes, A. fib who comes in with shortness of breath.  Patient initially found to be A. fib with RVR and a rate of 190.  Patient given 25 of diltiazem and got hypotensive.  Patient was cardioverted with 125 J remained in A. fib.  Upon presentation to [place] patient is able to say his name and just endorses feeling short of breath but not really able to give [place] great story of when it started or other details. There was a lot of gasoline lamps in his house.   Limited HPI due to patients AMS.            Past Medical History:  Diagnosis Date  . Arthritis    shoulder  . Asthma   . Atrial fibrillation (HCC)   . Chronic kidney disease    stage III  . COPD (chronic obstructive pulmonary disease) (HCC)   . Coronary artery disease   . Diabetes mellitus without complication (HCC)    type 2  . Dysrhythmia    PER [facility] IRREGULARLY IRREGULAR  . Full dentures    upper and lower  . Hypercholesteremia   . Hypertension   . Myocardial infarction (HCC)   . Shortness of breath dyspnea   . Swelling    FEET AND LEGS    There are no problems to display for this patient.   Past Surgical History:  Procedure Laterality Date  . CARDIAC CATHETERIZATION    . CATARACT EXTRACTION W/PHACO Right 01/28/2016   Procedure: CATARACT EXTRACTION PHACO AND INTRAOCULAR LENS PLACEMENT (IOC) right eye;  Surgeon: Lockie Mola, MD;  Location: [facility];  Service: Ophthalmology;  Laterality: Right;  DIABETIC-oral med MALYUGIN  . RETINAL DETACHMENT SURGERY      Prior to Admission medications   Medication Sig  Start Date End Date Taking? Authorizing Provider  allopurinol (ZYLOPRIM) 100 MG tablet Take 100 mg by mouth daily.    [provider]  amLODipine (NORVASC) 10 MG tablet Take 10 mg by mouth daily. am    [provider]  aspirin 81 MG tablet Take 81 mg by mouth daily.    [provider]  betamethasone dipropionate (DIPROLENE) 0.05 % cream Apply topically 2 (two) times daily.    [provider]  carvedilol (COREG) 12.5 MG tablet Take 12.5 mg by mouth 2 (two) times daily with a meal.    [provider]  clopidogrel (PLAVIX) 75 MG tablet Take 75 mg by mouth daily.    [provider]  diltiazem (TIAZAC) 300 MG 24 hr capsule Take 300 mg by mouth daily.    [provider]  furosemide (LASIX) 20 MG tablet Take 20 mg by mouth.    [provider]  glipiZIDE (GLUCOTROL) 10 MG tablet Take 10 mg by mouth daily before breakfast.    [provider]  lisinopril (PRINIVIL,ZESTRIL) 5 MG tablet Take 5 mg by mouth daily.    [provider]  potassium chloride (KLOR-CON) 20 MEQ packet Take by mouth 2 (two) times daily.    [provider]  pravastatin (PRAVACHOL) 40 MG tablet Take 40 mg by mouth daily.  [provider]    Allergies Patient has no known allergies.  History reviewed. No pertinent family history.  Social History Social History   Tobacco Use  . Smoking status: Former Smoker    Packs/day: 0.25    Years: 50.00    Pack years: 12.50    Types: Cigarettes  . Smokeless tobacco: Current User    Types: Chew  Substance Use Topics  . Alcohol use: No  . Drug use: No      Review of Systems Constitutional: No fever/chills Eyes: No visual changes. ENT: No sore throat. Cardiovascular: No chest pain Respiratory: Positive for SOB Gastrointestinal: No abdominal pain.  No nausea, no vomiting.  No diarrhea.  No constipation. Genitourinary: Negative for dysuria. Musculoskeletal: Negative for  back pain. Skin: Negative for rash. Neurological: Negative for headaches, focal weakness or numbness. All other ROS negative although some what limited by mentation. ____________________________________________   PHYSICAL EXAM:  VITAL SIGNS: ED Triage Vitals  Enc Vitals Group     BP 08/28/19 1912 (!) 126/104     Pulse Rate 08/28/19 1912 (!) 170     Resp 08/28/19 1912 (!) 23     Temp --      Temp src --      SpO2 08/28/19 1930 95 %     Weight 08/28/19 1913 170 lb (77.1 kg)     Height 08/28/19 1913 5\' 11"  (1.803 m)     Head Circumference --      Peak Flow --      Pain Score 08/28/19 1913 0     Pain Loc --      Pain Edu? --      Excl. in GC? --     Constitutional: Alert looking around but confused  Eyes: Conjunctivae are normal. EOMI. Head: Atraumatic. Nose: No congestion/rhinnorhea. Mouth/Throat: Mucous membranes are moist.   Neck: No stridor. Trachea Midline. FROM Cardiovascular: irregular fast, cold extremities Respiratory: clear lungs, breath sounds bilaterally Gastrointestinal: Soft and nontender. No distention. No abdominal bruits.  Musculoskeletal: No lower extremity tenderness nor edema.  No joint effusions. Neurologic:  MAEW no obvious deficits but somewhat limited due to confusion  Skin:  Skin is cold, dry and intact. No rash noted. Psychiatric: Mood and affect are normal. Speech and behavior are normal. GU: Deferred   ____________________________________________   LABS (all labs ordered are listed, but only abnormal results are displayed)  Labs Reviewed  GLUCOSE, CAPILLARY - Abnormal; Notable for the following components:      Result Value   Glucose-Capillary 114 (*)    All other components within normal limits  CBC WITH DIFFERENTIAL/PLATELET - Abnormal; Notable for the following components:   WBC 10.9 (*)    RBC 3.70 (*)    Hemoglobin 10.6 (*)    HCT 31.7 (*)    RDW 16.0 (*)    Neutro Abs 8.4 (*)    Abs Immature Granulocytes 0.11 (*)    All other  components within normal limits  BASIC METABOLIC PANEL - Abnormal; Notable for the following components:   Potassium 5.2 (*)    CO2 11 (*)    Glucose, Bld 143 (*)    BUN 33 (*)    Creatinine, Ser 3.32 (*)    Calcium 8.2 (*)    GFR calc non Af Amer 16 (*)    GFR calc Af Amer 19 (*)    Anion gap 18 (*)    All other components within normal limits  HEPATIC FUNCTION PANEL - Abnormal;  Notable for the following components:   Total Protein 6.1 (*)    Albumin 3.4 (*)    AST 75 (*)    ALT 51 (*)    Bilirubin, Direct 0.4 (*)    All other components within normal limits  T4, FREE - Abnormal; Notable for the following components:   Free T4 1.38 (*)    All other components within normal limits  PHOSPHORUS - Abnormal; Notable for the following components:   Phosphorus 6.3 (*)    All other components within normal limits  BRAIN NATRIURETIC PEPTIDE - Abnormal; Notable for the following components:   B Natriuretic Peptide 1,337.0 (*)    All other components within normal limits  LACTIC ACID, PLASMA - Abnormal; Notable for the following components:   Lactic Acid, Venous 7.8 (*)    All other components within normal limits  TROPONIN I (HIGH SENSITIVITY) - Abnormal; Notable for the following components:   Troponin I (High Sensitivity) 35 (*)    All other components within normal limits  RESPIRATORY PANEL BY RT PCR (FLU A&B, COVID)  TSH  MAGNESIUM  PROCALCITONIN  PROCALCITONIN  LACTIC ACID, PLASMA  COOXEMETRY PANEL  BLOOD GAS, ARTERIAL  APTT  PROTIME-INR  HEPARIN LEVEL (UNFRACTIONATED)  CBC   ____________________________________________   ED ECG REPORT I, Vanessa Niarada, the attending physician, personally viewed and interpreted this ECG.  EKG AFIB with RVR with RBBB, no st elevation, with some flipped t waves otherwise normal intervals  Repeat ekg with afib with rvr but now long sinus pause no st elevation, similar flipped t waves otherwise normal intervals.   ____________________________________________  RADIOLOGY Robert Bellow, personally viewed and evaluated these images (plain radiographs) as part of my medical decision making, as well as reviewing the written report by the radiologist.  ED MD interpretation:  cardiomegly  Official radiology report(s): DG Chest Portable 1 View  Result Date: 08/28/2019 CLINICAL DATA:  Shortness of breath EXAM: PORTABLE CHEST 1 VIEW COMPARISON:  01/08/2009 FINDINGS: The heart size is enlarged. There is no pneumothorax. There are likely small bilateral pleural effusions, left greater than right. Bibasilar airspace opacities are noted and are favored to represent atelectasis. There is pleuroparenchymal scarring at the lung apices. Aortic calcifications are noted. IMPRESSION: 1. No acute cardiopulmonary process. 2. Cardiomegaly without overt edema. 3. Bibasilar atelectasis and possible trace bilateral pleural effusions. Electronically Signed   By: Constance Holster M.D.   On: 08/28/2019 19:39    ____________________________________________   PROCEDURES  Procedure(s) performed (including Critical Care):  .Critical Care Performed by: Vanessa [place], MD Authorized by: Vanessa [place], MD   Critical care provider statement:    Critical care time (minutes):  75   Critical care was necessary to treat or prevent imminent or life-threatening deterioration of the following conditions:  Cardiac failure and sepsis   Critical care was time spent personally by me on the following activities:  Discussions with consultants, evaluation of patient's response to treatment, examination of patient, ordering and performing treatments and interventions, ordering and review of laboratory studies, ordering and review of radiographic studies, pulse oximetry, re-evaluation of patient's condition, obtaining history from patient or surrogate and review of old charts .Cardioversion  Date/Time: 08/29/2019 1:36 AM Performed by: Vanessa [place], MD Authorized by: Vanessa [place], MD   Pre-procedure details:    Cardioversion basis:  Emergent   Rhythm:  Atrial fibrillation Patient sedated: No Attempt one:    Cardioversion mode:  Synchronous   Waveform:  Monophasic   Shock (Joules):  200   Shock outcome:  No change in rhythm Post-procedure details:    Patient status:  Awake   Patient tolerance of procedure:  Tolerated well, no immediate complications     ____________________________________________   INITIAL IMPRESSION / ASSESSMENT AND PLAN / ED COURSE   HAWTHORNE DAY was evaluated in Emergency Department on 08/28/2019 for the symptoms described in the history of present illness. He was evaluated in the context of the global COVID-19 pandemic, which necessitated consideration that the patient might be at risk for infection with the SARS-CoV-2 virus that causes COVID-19. Institutional protocols and algorithms that pertain to the evaluation of patients at risk for COVID-19 are in a state of rapid change based on information released by regulatory bodies including the CDC and federal and state organizations. These policies and algorithms were followed during the patient's care in the ED.     Critical pt altered who presents with SOB. Differential includes: Most concerning 2/2 afib with rvr. Given widen qrs given 1g ca in case secondary hyerkalemia. However no change.  Possible CO poison given around gas heating devices. Will try to control rates in case that is driving cause of AMS.  PNA-will get xray to evaluation Anemia-CBC to evaluate ACS- will get trops COVID- will get testing per algorithm. PE-lower suspicion given no risk factors and other cause more likely   Labs are concerning for cardiogenic shock with his elevated lactate cold hands and his elevated BNP.  Bedside ultrasound not show evidence of effusion or right heart strain.  Patient has evidence of endorgan damage with elevated creatinine of 3.3 and LFT  elevation.  No anemia noted.  Patient initially given 5 mg metoprolol with no improvement.  Discussed with Dr. Park Breed from cardiology who recommended amio although patient has been having these sinus pauses up to 5 seconds versus cardioversion if he becomes unstable.  Patient became hypotensive and was cardioverted with 200 J and again stayed in the A. fib with RVR.  I discussed with Dr. Welton Flakes again who recommended not trying to control the heart rates and focusing on his blood pressure.  We will start him on dopamine infusion.  No ICU beds available so we will need to transfer patient. Pt on 4L oxygen but I suspect some of that is 2/2 cold extremities and not getting good pulse ox.   8:38 PM not having much improvement on the dopamine I discussed with the [facility] health cardiology doctor who recommended switching over to Levophed.  Will discuss to the ICU for admission.  9:03 PM patient on heparin and is having waxing and waning mental status.  Occasionally able to respond all commands and move all extremities.  Will get quick CT head to make sure no evidence of intracranial hemorrhage.  Ct head negative.  Patient started on broad-spectrum antibiotics given he was hypothermic and hypotensive.  We will also put a Bear warmer on him and put in a temp Foley. Given 1L fluid but holding off on aggressive fluid with elevated BNP and concern for possible cardiogenic shock?  Re-evaluated pt and mental status is improving  I suspect hypothermia is the driving cause of his afib with rvr and hypotension because with temperature correction, rates are going down and BP improving. Pt accepted by [facility] for transfer for ICU care.   ____________________________________________   FINAL CLINICAL IMPRESSION(S) / ED DIAGNOSES   Final diagnoses:  Sepsis, due to unspecified organism, unspecified whether acute organ dysfunction  present (HCC)  Hypothermia, initial encounter  Atrial fibrillation with rapid  ventricular response (HCC)  AKI (acute kidney injury) (HCC)     MEDICATIONS GIVEN DURING THIS VISIT:  Medications  calcium gluconate inj 10% (1 g) URGENT USE ONLY! (1 g Intravenous Given 08/28/19 1923)  metoprolol tartrate (LOPRESSOR) injection 5 mg (5 mg Intravenous Given 08/28/19 1930)  diltiazem (CARDIZEM) 25 MG/5ML injection (  Given 08/28/19 1956)  heparin bolus via infusion 3,850 Units (3,850 Units Intravenous Bolus from Bag 08/28/19 2001)  ceFEPIme (MAXIPIME) 2 g in sodium chloride 0.9 % 100 mL IVPB (0 g Intravenous Stopped 08/28/19 2220)  metroNIDAZOLE (FLAGYL) IVPB 500 mg (500 mg Intravenous Transfusing/Transfer 08/28/19 2323)  vancomycin (VANCOCIN) IVPB 1000 mg/200 mL premix (1,000 mg Intravenous Transfusing/Transfer 08/28/19 2324)     ED Discharge Orders    None       Note:  This document was prepared using Dragon voice recognition software and may include unintentional dictation errors.   Concha SeFunke, Adysson Revelle E, MD 08/29/19 [id number]    Concha SeFunke, Damaris Geers E, MD 09/11/19 [phone]

## 2019-08-29 ENCOUNTER — Inpatient Hospital Stay (HOSPITAL_COMMUNITY): Payer: Medicare Other

## 2019-08-29 DIAGNOSIS — J9601 Acute respiratory failure with hypoxia: Secondary | ICD-10-CM

## 2019-08-29 DIAGNOSIS — R570 Cardiogenic shock: Secondary | ICD-10-CM

## 2019-08-29 DIAGNOSIS — I4892 Unspecified atrial flutter: Secondary | ICD-10-CM

## 2019-08-29 DIAGNOSIS — I5021 Acute systolic (congestive) heart failure: Secondary | ICD-10-CM

## 2019-08-29 DIAGNOSIS — N179 Acute kidney failure, unspecified: Secondary | ICD-10-CM

## 2019-08-29 DIAGNOSIS — I340 Nonrheumatic mitral (valve) insufficiency: Secondary | ICD-10-CM

## 2019-08-29 DIAGNOSIS — N183 Chronic kidney disease, stage 3 unspecified: Secondary | ICD-10-CM

## 2019-08-29 DIAGNOSIS — I5082 Biventricular heart failure: Secondary | ICD-10-CM

## 2019-08-29 DIAGNOSIS — R000 Tachycardia, unspecified: Secondary | ICD-10-CM

## 2019-08-29 DIAGNOSIS — I361 Nonrheumatic tricuspid (valve) insufficiency: Secondary | ICD-10-CM

## 2019-08-29 DIAGNOSIS — N1832 Chronic kidney disease, stage 3b: Secondary | ICD-10-CM

## 2019-08-29 DIAGNOSIS — I5043 Acute on chronic combined systolic (congestive) and diastolic (congestive) heart failure: Secondary | ICD-10-CM

## 2019-08-29 DIAGNOSIS — I472 Ventricular tachycardia: Secondary | ICD-10-CM

## 2019-08-29 LAB — TROPONIN I (HIGH SENSITIVITY): Troponin I (High Sensitivity): 65 ng/L — ABNORMAL HIGH (ref <18)

## 2019-08-29 LAB — ECHOCARDIOGRAM COMPLETE
Height: 71 in
Weight: 2624.36 oz

## 2019-08-29 LAB — POCT I-STAT 7, (LYTES, BLD GAS, ICA,H+H)
Acid-base deficit: 9 mmol/L — ABNORMAL HIGH (ref 0.0–2.0)
Acid-base deficit: 17 mmol/L — ABNORMAL HIGH (ref 0.0–2.0)
Acid-base deficit: 11 mmol/L — ABNORMAL HIGH (ref 0.0–2.0)
Acid-base deficit: 1 mmol/L (ref 0.0–2.0)
Bicarbonate: 14.3 mmol/L — ABNORMAL LOW (ref 20.0–28.0)
Bicarbonate: 7.3 mmol/L — ABNORMAL LOW (ref 20.0–28.0)
Bicarbonate: 11.3 mmol/L — ABNORMAL LOW (ref 20.0–28.0)
Bicarbonate: 21.2 mmol/L (ref 20.0–28.0)
Calcium, Ion: 1.07 mmol/L — ABNORMAL LOW (ref 1.15–1.40)
Calcium, Ion: 1.06 mmol/L — ABNORMAL LOW (ref 1.15–1.40)
Calcium, Ion: 1.05 mmol/L — ABNORMAL LOW (ref 1.15–1.40)
Calcium, Ion: 1.06 mmol/L — ABNORMAL LOW (ref 1.15–1.40)
HCT: 33 % — ABNORMAL LOW (ref 39.0–52.0)
HCT: 30 % — ABNORMAL LOW (ref 39.0–52.0)
HCT: 31 % — ABNORMAL LOW (ref 39.0–52.0)
HCT: 33 % — ABNORMAL LOW (ref 39.0–52.0)
Hemoglobin: 11.2 g/dL — ABNORMAL LOW (ref 13.0–17.0)
Hemoglobin: 10.2 g/dL — ABNORMAL LOW (ref 13.0–17.0)
Hemoglobin: 10.5 g/dL — ABNORMAL LOW (ref 13.0–17.0)
Hemoglobin: 11.2 g/dL — ABNORMAL LOW (ref 13.0–17.0)
O2 Saturation: 100 %
O2 Saturation: 99 %
O2 Saturation: 99 %
O2 Saturation: 99 %
Patient temperature: 34.3
Patient temperature: 35.1
Patient temperature: 35.7
Patient temperature: 37.6
Potassium: 4.6 mmol/L (ref 3.5–5.1)
Potassium: 4.9 mmol/L (ref 3.5–5.1)
Potassium: 4.9 mmol/L (ref 3.5–5.1)
Potassium: 3.9 mmol/L (ref 3.5–5.1)
Sodium: 142 mmol/L (ref 135–145)
Sodium: 141 mmol/L (ref 135–145)
Sodium: 141 mmol/L (ref 135–145)
Sodium: 144 mmol/L (ref 135–145)
TCO2: 15 mmol/L — ABNORMAL LOW (ref 22–32)
TCO2: 8 mmol/L — ABNORMAL LOW (ref 22–32)
TCO2: 12 mmol/L — ABNORMAL LOW (ref 22–32)
TCO2: 22 mmol/L (ref 22–32)
pCO2 arterial: 21.4 mmHg — ABNORMAL LOW (ref 32.0–48.0)
pCO2 arterial: <15 mmHg — CL (ref 32.0–48.0)
pCO2 arterial: 15.6 mmHg — CL (ref 32.0–48.0)
pCO2 arterial: 29.3 mmHg — ABNORMAL LOW (ref 32.0–48.0)
pH, Arterial: 7.421 (ref 7.350–7.450)
pH, Arterial: 7.322 — ABNORMAL LOW (ref 7.350–7.450)
pH, Arterial: 7.464 — ABNORMAL HIGH (ref 7.350–7.450)
pH, Arterial: 7.469 — ABNORMAL HIGH (ref 7.350–7.450)
pO2, Arterial: 156 mmHg — ABNORMAL HIGH (ref 83.0–108.0)
pO2, Arterial: 130 mmHg — ABNORMAL HIGH (ref 83.0–108.0)
pO2, Arterial: 144 mmHg — ABNORMAL HIGH (ref 83.0–108.0)
pO2, Arterial: 142 mmHg — ABNORMAL HIGH (ref 83.0–108.0)

## 2019-08-29 LAB — COMPREHENSIVE METABOLIC PANEL
ALT: 63 U/L — ABNORMAL HIGH (ref 0–44)
AST: 77 U/L — ABNORMAL HIGH (ref 15–41)
Albumin: 3.3 g/dL — ABNORMAL LOW (ref 3.5–5.0)
Alkaline Phosphatase: 83 U/L (ref 38–126)
Anion gap: 18 — ABNORMAL HIGH (ref 5–15)
BUN: 35 mg/dL — ABNORMAL HIGH (ref 8–23)
CO2: 13 mmol/L — ABNORMAL LOW (ref 22–32)
Calcium: 8.6 mg/dL — ABNORMAL LOW (ref 8.9–10.3)
Chloride: 109 mmol/L (ref 98–111)
Creatinine, Ser: 3.42 mg/dL — ABNORMAL HIGH (ref 0.61–1.24)
GFR calc Af Amer: 18 mL/min — ABNORMAL LOW (ref >60)
GFR calc non Af Amer: 16 mL/min — ABNORMAL LOW (ref >60)
Glucose, Bld: 217 mg/dL — ABNORMAL HIGH (ref 70–99)
Potassium: 5.1 mmol/L (ref 3.5–5.1)
Sodium: 140 mmol/L (ref 135–145)
Total Bilirubin: 0.9 mg/dL (ref 0.3–1.2)
Total Protein: 6.1 g/dL — ABNORMAL LOW (ref 6.5–8.1)

## 2019-08-29 LAB — BASIC METABOLIC PANEL
Anion gap: 18 — ABNORMAL HIGH (ref 5–15)
Anion gap: 18 — ABNORMAL HIGH (ref 5–15)
BUN: 38 mg/dL — ABNORMAL HIGH (ref 8–23)
BUN: 39 mg/dL — ABNORMAL HIGH (ref 8–23)
CO2: 20 mmol/L — ABNORMAL LOW (ref 22–32)
CO2: 20 mmol/L — ABNORMAL LOW (ref 22–32)
Calcium: 8.5 mg/dL — ABNORMAL LOW (ref 8.9–10.3)
Calcium: 8.4 mg/dL — ABNORMAL LOW (ref 8.9–10.3)
Chloride: 106 mmol/L (ref 98–111)
Chloride: 105 mmol/L (ref 98–111)
Creatinine, Ser: 3.45 mg/dL — ABNORMAL HIGH (ref 0.61–1.24)
Creatinine, Ser: 3.16 mg/dL — ABNORMAL HIGH (ref 0.61–1.24)
GFR calc Af Amer: 18 mL/min — ABNORMAL LOW (ref >60)
GFR calc Af Amer: 20 mL/min — ABNORMAL LOW (ref >60)
GFR calc non Af Amer: 16 mL/min — ABNORMAL LOW (ref >60)
GFR calc non Af Amer: 17 mL/min — ABNORMAL LOW (ref >60)
Glucose, Bld: 267 mg/dL — ABNORMAL HIGH (ref 70–99)
Glucose, Bld: 232 mg/dL — ABNORMAL HIGH (ref 70–99)
Potassium: 4 mmol/L (ref 3.5–5.1)
Potassium: 3.7 mmol/L (ref 3.5–5.1)
Sodium: 144 mmol/L (ref 135–145)
Sodium: 143 mmol/L (ref 135–145)

## 2019-08-29 LAB — COOXEMETRY PANEL
Carboxyhemoglobin: 0.4 % — ABNORMAL LOW (ref 0.5–1.5)
Carboxyhemoglobin: 0.7 % (ref 0.5–1.5)
Carboxyhemoglobin: 1.2 % (ref 0.5–1.5)
Methemoglobin: 1 % (ref 0.0–1.5)
Methemoglobin: 1.1 % (ref 0.0–1.5)
Methemoglobin: 0.6 % (ref 0.0–1.5)
O2 Saturation: 31 %
O2 Saturation: 41.2 %
O2 Saturation: 59 %
Total hemoglobin: 11 g/dL — ABNORMAL LOW (ref 12.0–16.0)
Total hemoglobin: 11 g/dL — ABNORMAL LOW (ref 12.0–16.0)
Total hemoglobin: 11.4 g/dL — ABNORMAL LOW (ref 12.0–16.0)

## 2019-08-29 LAB — CBC
HCT: 32.9 % — ABNORMAL LOW (ref 39.0–52.0)
Hemoglobin: 10.6 g/dL — ABNORMAL LOW (ref 13.0–17.0)
MCH: 29.3 pg (ref 26.0–34.0)
MCHC: 32.2 g/dL (ref 30.0–36.0)
MCV: 90.9 fL (ref 80.0–100.0)
Platelets: 179 10*3/uL (ref 150–400)
RBC: 3.62 MIL/uL — ABNORMAL LOW (ref 4.22–5.81)
RDW: 15.8 % — ABNORMAL HIGH (ref 11.5–15.5)
WBC: 13.9 10*3/uL — ABNORMAL HIGH (ref 4.0–10.5)
nRBC: 0 % (ref 0.0–0.2)

## 2019-08-29 LAB — HEMOGLOBIN A1C
Hgb A1c MFr Bld: 7 % — ABNORMAL HIGH (ref 4.8–5.6)
Mean Plasma Glucose: 154.2 mg/dL

## 2019-08-29 LAB — GLUCOSE, CAPILLARY
Glucose-Capillary: 166 mg/dL — ABNORMAL HIGH (ref 70–99)
Glucose-Capillary: 215 mg/dL — ABNORMAL HIGH (ref 70–99)
Glucose-Capillary: 181 mg/dL — ABNORMAL HIGH (ref 70–99)
Glucose-Capillary: 120 mg/dL — ABNORMAL HIGH (ref 70–99)
Glucose-Capillary: 133 mg/dL — ABNORMAL HIGH (ref 70–99)

## 2019-08-29 LAB — URINE CULTURE: Culture: NO GROWTH

## 2019-08-29 LAB — BETA-HYDROXYBUTYRIC ACID: Beta-Hydroxybutyric Acid: 0.75 mmol/L — ABNORMAL HIGH (ref 0.05–0.27)

## 2019-08-29 LAB — PROTIME-INR
INR: 1.6 — ABNORMAL HIGH (ref 0.8–1.2)
Prothrombin Time: 19 seconds — ABNORMAL HIGH (ref 11.4–15.2)

## 2019-08-29 LAB — LACTIC ACID, PLASMA
Lactic Acid, Venous: 8.9 mmol/L (ref 0.5–1.9)
Lactic Acid, Venous: 3.7 mmol/L (ref 0.5–1.9)
Lactic Acid, Venous: 3.5 mmol/L (ref 0.5–1.9)
Lactic Acid, Venous: 1.3 mmol/L (ref 0.5–1.9)

## 2019-08-29 LAB — MAGNESIUM: Magnesium: 1.8 mg/dL (ref 1.7–2.4)

## 2019-08-29 LAB — KETONES, URINE: Ketones, ur: NEGATIVE mg/dL

## 2019-08-29 LAB — TSH: TSH: 1.042 u[IU]/mL (ref 0.350–4.500)

## 2019-08-29 LAB — HEPARIN LEVEL (UNFRACTIONATED): Heparin Unfractionated: 1.2 IU/mL — ABNORMAL HIGH (ref 0.30–0.70)

## 2019-08-29 LAB — PHOSPHORUS: Phosphorus: 7 mg/dL — ABNORMAL HIGH (ref 2.5–4.6)

## 2019-08-29 LAB — T4, FREE: Free T4: 1.25 ng/dL — ABNORMAL HIGH (ref 0.61–1.12)

## 2019-08-29 MED ORDER — SODIUM BICARBONATE 8.4 % IV SOLN
50.0000 meq | Freq: Once | INTRAVENOUS | Status: AC
  Administered 2019-08-29: 03:00:00 50 meq via INTRAVENOUS

## 2019-08-29 MED ORDER — AMIODARONE HCL IN DEXTROSE 360-4.14 MG/200ML-% IV SOLN
INTRAVENOUS | Status: AC
  Filled 2019-08-29: qty 200

## 2019-08-29 MED ORDER — SODIUM BICARBONATE 8.4 % IV SOLN
INTRAVENOUS | Status: AC
  Filled 2019-08-29: qty 50

## 2019-08-29 MED ORDER — SODIUM BICARBONATE-DEXTROSE 150-5 MEQ/L-% IV SOLN
150.0000 meq | INTRAVENOUS | Status: DC
  Administered 2019-08-29: 05:00:00 150 meq via INTRAVENOUS
  Filled 2019-08-29: qty 1000

## 2019-08-29 MED ORDER — FUROSEMIDE 10 MG/ML IJ SOLN
80.0000 mg | Freq: Once | INTRAMUSCULAR | Status: AC
  Administered 2019-08-29: 80 mg via INTRAVENOUS

## 2019-08-29 MED ORDER — NOREPINEPHRINE 16 MG/250ML-% IV SOLN
2.0000 ug/min | INTRAVENOUS | Status: DC
  Administered 2019-08-29: 45 ug/min via INTRAVENOUS
  Administered 2019-08-30: 2 ug/min via INTRAVENOUS
  Filled 2019-08-29: qty 250

## 2019-08-29 MED ORDER — VASOPRESSIN 20 UNIT/ML IV SOLN
0.0300 [IU]/min | INTRAVENOUS | Status: DC
  Administered 2019-08-29: 0.03 [IU]/min via INTRAVENOUS
  Filled 2019-08-29: qty 2

## 2019-08-29 MED ORDER — "THROMBI-PAD 3""X3"" EX PADS"
1.0000 | MEDICATED_PAD | CUTANEOUS | Status: AC
  Administered 2019-08-29: 18:00:00 1 via TOPICAL
  Filled 2019-08-29: qty 1

## 2019-08-29 MED ORDER — ORAL CARE MOUTH RINSE
15.0000 mL | Freq: Two times a day (BID) | OROMUCOSAL | Status: DC
  Administered 2019-08-29 – 2019-08-30 (×3): 15 mL via OROMUCOSAL

## 2019-08-29 MED ORDER — SODIUM BICARBONATE 8.4 % IV SOLN
100.0000 meq | Freq: Once | INTRAVENOUS | Status: DC
  Filled 2019-08-29: qty 100

## 2019-08-29 MED ORDER — VANCOMYCIN VARIABLE DOSE PER UNSTABLE RENAL FUNCTION (PHARMACIST DOSING): Status: DC

## 2019-08-29 MED ORDER — ASPIRIN 81 MG PO CHEW
81.0000 mg | CHEWABLE_TABLET | Freq: Every day | ORAL | Status: DC
  Administered 2019-08-30 – 2019-09-05 (×7): 81 mg via ORAL
  Filled 2019-08-29 (×8): qty 1

## 2019-08-29 MED ORDER — SODIUM BICARBONATE 8.4 % IV SOLN: INTRAVENOUS | Status: DC

## 2019-08-29 MED ORDER — HEPARIN (PORCINE) 25000 UT/250ML-% IV SOLN
1050.0000 [IU]/h | INTRAVENOUS | Status: DC
  Administered 2019-08-29: 1050 [IU]/h via INTRAVENOUS

## 2019-08-29 MED ORDER — SODIUM CHLORIDE 0.9% FLUSH
3.0000 mL | Freq: Two times a day (BID) | INTRAVENOUS | Status: DC
  Administered 2019-08-29 – 2019-09-06 (×13): 3 mL via INTRAVENOUS

## 2019-08-29 MED ORDER — SODIUM BICARBONATE-DEXTROSE 150-5 MEQ/L-% IV SOLN
150.0000 meq | INTRAVENOUS | Status: DC
  Administered 2019-08-29: 150 meq via INTRAVENOUS
  Filled 2019-08-29 (×2): qty 1000

## 2019-08-29 MED ORDER — NOREPINEPHRINE 4 MG/250ML-% IV SOLN
2.0000 ug/min | INTRAVENOUS | Status: DC
  Filled 2019-08-29: qty 250

## 2019-08-29 MED ORDER — CHLORHEXIDINE GLUCONATE 0.12 % MT SOLN
15.0000 mL | Freq: Two times a day (BID) | OROMUCOSAL | Status: DC
  Administered 2019-08-29 – 2019-08-31 (×5): 15 mL via OROMUCOSAL
  Filled 2019-08-29 (×3): qty 15

## 2019-08-29 MED ORDER — VANCOMYCIN HCL 500 MG IV SOLR
500.0000 mg | INTRAVENOUS | Status: AC
  Administered 2019-08-29: 03:00:00 500 mg via INTRAVENOUS
  Filled 2019-08-29 (×2): qty 500

## 2019-08-29 MED ORDER — SODIUM CHLORIDE 0.9% FLUSH: 3.0000 mL | INTRAVENOUS | Status: DC | PRN

## 2019-08-29 MED ORDER — SODIUM BICARBONATE 8.4 % IV SOLN
50.0000 meq | Freq: Once | INTRAVENOUS | Status: AC
  Administered 2019-08-29: 04:00:00 50 meq via INTRAVENOUS
  Filled 2019-08-29: qty 50

## 2019-08-29 MED ORDER — PRAVASTATIN SODIUM 40 MG PO TABS
40.0000 mg | ORAL_TABLET | Freq: Every day | ORAL | Status: DC
  Administered 2019-08-30 – 2019-09-03 (×5): 40 mg via ORAL
  Filled 2019-08-29 (×6): qty 1

## 2019-08-29 MED ORDER — ACETAMINOPHEN 325 MG PO TABS: 650.0000 mg | ORAL_TABLET | ORAL | Status: DC | PRN

## 2019-08-29 MED ORDER — NOREPINEPHRINE 4 MG/250ML-% IV SOLN
INTRAVENOUS | Status: AC
  Administered 2019-08-29: 4 mg via INTRAVENOUS
  Filled 2019-08-29: qty 250

## 2019-08-29 MED ORDER — SODIUM CHLORIDE 0.9 % IV SOLN: 250.0000 mL | INTRAVENOUS | Status: DC | PRN

## 2019-08-29 MED ORDER — CHLORHEXIDINE GLUCONATE CLOTH 2 % EX PADS
6.0000 | MEDICATED_PAD | Freq: Every day | CUTANEOUS | Status: DC
  Administered 2019-08-29 – 2019-09-05 (×7): 6 via TOPICAL

## 2019-08-29 MED ORDER — AMIODARONE HCL IN DEXTROSE 360-4.14 MG/200ML-% IV SOLN
30.0000 mg/h | INTRAVENOUS | Status: DC
  Administered 2019-08-29 – 2019-09-03 (×11): 30 mg/h via INTRAVENOUS
  Filled 2019-08-29 (×17): qty 200

## 2019-08-29 MED ORDER — SODIUM BICARBONATE-DEXTROSE 150-5 MEQ/L-% IV SOLN
150.0000 meq | INTRAVENOUS | Status: DC
  Filled 2019-08-29: qty 1000

## 2019-08-29 MED ORDER — SODIUM BICARBONATE 8.4 % IV SOLN
50.0000 meq | Freq: Once | INTRAVENOUS | Status: AC
  Administered 2019-08-29: 50 meq via INTRAVENOUS

## 2019-08-29 MED ORDER — AMIODARONE HCL IN DEXTROSE 360-4.14 MG/200ML-% IV SOLN
60.0000 mg/h | INTRAVENOUS | Status: DC
  Administered 2019-08-29: 60 mg/h via INTRAVENOUS

## 2019-08-29 MED ORDER — CALCIUM GLUCONATE-NACL 1-0.675 GM/50ML-% IV SOLN
1.0000 g | Freq: Once | INTRAVENOUS | Status: AC
  Administered 2019-08-29: 04:00:00 1000 mg via INTRAVENOUS
  Filled 2019-08-29: qty 50

## 2019-08-29 MED ORDER — SODIUM CHLORIDE 0.9 % IV SOLN
2.0000 g | INTRAVENOUS | Status: DC
  Filled 2019-08-29: qty 2

## 2019-08-29 MED ORDER — INSULIN ASPART 100 UNIT/ML ~~LOC~~ SOLN
0.0000 [IU] | Freq: Three times a day (TID) | SUBCUTANEOUS | Status: DC
  Administered 2019-08-29: 2 [IU] via SUBCUTANEOUS
  Administered 2019-08-29: 3 [IU] via SUBCUTANEOUS
  Administered 2019-08-30: 2 [IU] via SUBCUTANEOUS
  Administered 2019-08-31 – 2019-09-04 (×6): 1 [IU] via SUBCUTANEOUS

## 2019-08-29 MED ORDER — ONDANSETRON HCL 4 MG/2ML IJ SOLN: 4.0000 mg | Freq: Four times a day (QID) | INTRAMUSCULAR | Status: DC | PRN

## 2019-08-29 MED ORDER — HEPARIN (PORCINE) 25000 UT/250ML-% IV SOLN
800.0000 [IU]/h | INTRAVENOUS | Status: DC
  Administered 2019-08-29 – 2019-09-01 (×3): 800 [IU]/h via INTRAVENOUS
  Filled 2019-08-29 (×3): qty 250

## 2019-08-29 MED ORDER — FUROSEMIDE 10 MG/ML IJ SOLN
80.0000 mg | Freq: Two times a day (BID) | INTRAMUSCULAR | Status: DC
  Filled 2019-08-29: qty 8

## 2019-08-29 MED ORDER — EPINEPHRINE HCL 5 MG/250ML IV SOLN IN NS
0.5000 ug/min | INTRAVENOUS | Status: DC
  Filled 2019-08-29: qty 250

## 2019-08-29 NOTE — Consult Note (Signed)
NAME:  [name] [name], MRN:  [id number], DOB:  1935/08/13, LOS: 1 ADMISSION DATE:  08/28/2019, CONSULTATION DATE:  08/29/19 REFERRING MD:  [name]  CHIEF COMPLAINT:  Cardiogenic shock   Brief History   [name] [name] is a 84 y.o. male who was transferred from [facility] 12/15 with cardiogenic shock.  PCCM asked to assist early AM 12/16 due to increased WOB.  History of present illness   Pt on BiPAP; therefore, this HPI is obtained from chart review. [name] [name] is a 84 y.o. male who has a PMH including but not limited to CAD, A.fib, HTN, HLD, CKD, DM (see "past medical history" for rest).  He was transferred from [facility] 12/15 with cardiogenic shock 2/2 A.flutter / atrial tachycardia with RVR.  He had been given 25mg  diltiazem in ED and became markedly hypotensive.  DC-CV attempted but unsuccessful.  He was started on NE as well as empiric vanc / cefepime.  Post arrival to [facility] he was hypertensive so NE stopped.  Given 150mg  amio but dropped SBP to 60's so NE restarted.  Had bedside echo by Dr. [name] which showed LVEF ~ 25% with decreased RV function.  I discussed with Dr. [name] who did not need [place] to be involved at the time.  Early AM 12/16, he developed increased WOB.  He was started on BiPAP by [facility] and we were asked to evaluate him for intubation.  He was also given 2amps HCO3 and started on vasopressin.  MAP's improved to low 60's.  Past Medical History  has Cardiogenic shock (HCC); Acute systolic heart failure (HCC); AKI (acute kidney injury) (HCC); and Atrial flutter (HCC) on their problem list.  Significant Hospital Events   12/15 > transferred from [facility].  Consults:  PCCM.  Procedures:  L IJ CVL 12/16 >  R radial art line 12/16 >   Significant Diagnostic Tests:  CXR 12/15 > cardiomegaly, trace effusions. CT head 12/15 > remote left thalamic lacunar infarct.  No acute process. Echo 12/16 >   Micro Data:  Blood 12/15 >  Urine 12/15 >  Flu 12/15 > neg. SARS  CoV2 12/15 > neg.  Antimicrobials:  Vanc 12/15 >  Cefepime 12/15 >    Interim history/subjective:  On BiPAP, comfortable.  Objective:  Blood pressure 99/76, pulse (!) 153, temperature (!) 95.7 F (35.4 C), resp. rate (!) 21, SpO2 90 %. CVP:  [12 mmHg-30 mmHg] 16 mmHg      Intake/Output Summary (Last 24 hours) at 08/29/2019 0346 Last data filed at 08/29/2019 0300 Gross per 24 hour  Intake 229.18 ml  Output -  Net 229.18 ml   There were no vitals filed for this visit.  Examination: General: Adult male, chronically ill appearing, in NAD. Neuro: A&O x 3, no deficits. HEENT: [place]/AT. Sclerae anicteric.  EOMI.  BiPAP in place. Cardiovascular: Tachy, irregular. Lungs: Respirations even and unlabored.  CTA bilaterally, No W/R/R.  Abdomen: BS x 4, soft, NT/ND.  Musculoskeletal: No gross deformities, no edema.  Skin: Intact, warm, no rashes.  Assessment & Plan:   Cardiogenic Shock - unclear etiology at this point.  Exacerbated by a.flutter / atrial tachycardia, s/p failed DC-CV.  Co-ox 31. Hx A.fib, CAD. - Continue NE and vaso for now. - Have called cardiology fellow for assistance (heart failure already managing as primary; however, fellow covering overnight). - Ideally needs some inotropic support with co-ox 31; however, HR as high as 210 presents a challenge. - Continue amio, heparin. -   Acute hypoxic  respiratory failure - WOB much improved with BiPAP. - Continue BiPAP. - Bronchial hygiene. - No need for intubation at this point.  Possible sepsis of unclear etiology.  - Continue empiric vanc / cefepime and follow cultures.  AGMA - lactate + renal failure. AoCKD. Hypocalcemia. - Continue supportive care for now. - Might need CRRT if no improvement and if UOP doesn't pick up. - 1 additional amp HCO3 now then drip at 75/hr. - 1g Ca gluconate. - Follow BMP.  Hx DM. - SSI.   Best Practice:  Diet: NPO. Pain/Anxiety/Delirium protocol (if indicated): N/A. VAP  protocol (if indicated): N/A DVT prophylaxis: SCD's / Heparin. GI prophylaxis: N/A. Glucose control: SSI. Mobility: Bedrest. Code Status: Full. Family Communication: None available. Disposition: ICU.  Labs   CBC: Recent Labs  Lab 08/28/19 1919 08/29/19 0059 08/29/19 0109 08/29/19 0245  WBC 10.9* 13.9*  --   --   NEUTROABS 8.4*  --   --   --   HGB 10.6* 10.6* 11.2* 10.2*  HCT 31.7* 32.9* 33.0* 30.0*  MCV 85.7 90.9  --   --   PLT 184 179  --   --    Basic Metabolic Panel: Recent Labs  Lab 08/28/19 1919 08/29/19 0059 08/29/19 0109 08/29/19 0245  NA 140 140 142 141  K 5.2* 5.1 4.6 4.9  CL 111 109  --   --   CO2 11* 13*  --   --   GLUCOSE 143* 217*  --   --   BUN 33* 35*  --   --   CREATININE 3.32* 3.42*  --   --   CALCIUM 8.2* 8.6*  --   --   MG 2.1  --   --   --   PHOS 6.3*  --   --   --    GFR: Estimated Creatinine Clearance: 17.7 mL/min (A) (by C-G formula based on SCr of 3.42 mg/dL (H)). Recent Labs  Lab 08/28/19 1919 08/28/19 2126 08/29/19 0059 08/29/19 0243  PROCALCITON 0.15  --   --   --   WBC 10.9*  --  13.9*  --   LATICACIDVEN 7.8* 7.8*  --  8.9*   Liver Function Tests: Recent Labs  Lab 08/28/19 1919 08/29/19 0059  AST 75* 77*  ALT 51* 63*  ALKPHOS 85 83  BILITOT 1.1 0.9  PROT 6.1* 6.1*  ALBUMIN 3.4* 3.3*   No results for input(s): LIPASE, AMYLASE in the last 168 hours. No results for input(s): AMMONIA in the last 168 hours. ABG    Component Value Date/Time   PHART 7.322 (L) 08/29/2019 0245   PCO2ART <15.0 (LL) 08/29/2019 0245   PO2ART 130.0 (H) 08/29/2019 0245   HCO3 7.3 (L) 08/29/2019 0245   TCO2 8 (L) 08/29/2019 0245   ACIDBASEDEF 17.0 (H) 08/29/2019 0245   O2SAT 99.0 08/29/2019 0245    Coagulation Profile: Recent Labs  Lab 08/29/19 0059  INR 1.6*   Cardiac Enzymes: No results for input(s): CKTOTAL, CKMB, CKMBINDEX, TROPONINI in the last 168 hours. HbA1C: Hgb A1c MFr Bld  Date/Time Value Ref Range Status  08/29/2019  02:52 AM 7.0 (H) 4.8 - 5.6 % Final    Comment:    (NOTE) Pre diabetes:          5.7%-6.4% Diabetes:              >6.4% Glycemic control for   <7.0% adults with diabetes    CBG: Recent Labs  Lab 08/28/19 1911 08/29/19 0252  GLUCAP  114* 166*    Review of Systems:   Unable to obtain as pt on BiPAP.  Past medical history  He,  has a past medical history of Arthritis, Asthma, Atrial fibrillation (HCC), Chronic kidney disease, COPD (chronic obstructive pulmonary disease) (HCC), Coronary artery disease, Diabetes mellitus without complication (HCC), Dysrhythmia, Full dentures, Hypercholesteremia, Hypertension, Myocardial infarction (HCC), Shortness of breath dyspnea, and Swelling.   Surgical History    Past Surgical History:  Procedure Laterality Date  . CARDIAC CATHETERIZATION    . CATARACT EXTRACTION W/PHACO Right 01/28/2016   Procedure: CATARACT EXTRACTION PHACO AND INTRAOCULAR LENS PLACEMENT (IOC) right eye;  Surgeon: Lockie Mola, MD;  Location: [facility];  Service: Ophthalmology;  Laterality: Right;  DIABETIC-oral med MALYUGIN  . RETINAL DETACHMENT SURGERY       Social History   reports that he has quit smoking. His smoking use included cigarettes. He has a 12.50 pack-year smoking history. His smokeless tobacco use includes chew. He reports that he does not drink alcohol or use drugs.   Family history   His family history is not on file.   Allergies No Known Allergies   Home meds  Prior to Admission medications   Medication Sig Start Date End Date Taking? Authorizing Provider  allopurinol (ZYLOPRIM) 100 MG tablet Take 100 mg by mouth daily.    [provider]  amLODipine (NORVASC) 10 MG tablet Take 10 mg by mouth daily. am    [provider]  aspirin 81 MG tablet Take 81 mg by mouth daily.    [provider]  betamethasone dipropionate (DIPROLENE) 0.05 % cream Apply topically 2 (two) times daily.    [provider]   carvedilol (COREG) 12.5 MG tablet Take 12.5 mg by mouth 2 (two) times daily with a meal.    [provider]  clopidogrel (PLAVIX) 75 MG tablet Take 75 mg by mouth daily.    [provider]  diltiazem (TIAZAC) 300 MG 24 hr capsule Take 300 mg by mouth daily.    [provider]  furosemide (LASIX) 20 MG tablet Take 20 mg by mouth.    [provider]  glipiZIDE (GLUCOTROL) 10 MG tablet Take 10 mg by mouth daily before breakfast.    [provider]  lisinopril (PRINIVIL,ZESTRIL) 5 MG tablet Take 5 mg by mouth daily.    [provider]  potassium chloride (KLOR-CON) 20 MEQ packet Take by mouth 2 (two) times daily.    [provider]  pravastatin (PRAVACHOL) 40 MG tablet Take 40 mg by mouth daily.    [provider]    Critical care time: 60 min.    Rutherford Guys, [place] [facility] 08/29/2019, 3:46 AM

## 2019-08-29 NOTE — Progress Notes (Signed)
*  PRELIMINARY RESULTS* Echocardiogram 2D Echocardiogram has been performed.  [name] [name] 08/29/2019, 11:58 AM

## 2019-08-29 NOTE — CV Procedure (Signed)
Radial arterial line placement.     Indication: cardiogenic shock   Emergent consent assumed. The right  wrist was prepped and draped in the routine sterile fashion a single lumen radial arterial catheter was placed in the right radial artery using a modified Seldinger technique and u/s guidance. Good blood flow and wave forms. A dressing was placed.     Glori Bickers, MD  1:33 AM

## 2019-08-29 NOTE — Progress Notes (Signed)
NAME:  [name] [name], MRN:  [id number], DOB:  [birth year], LOS: 1 ADMISSION DATE:  08/28/2019, CONSULTATION DATE:  08/29/19 REFERRING MD:  [name]  CHIEF COMPLAINT:  Cardiogenic shock   Brief History   [name] [name] is a 84 y.o. male who was transferred from [facility] 12/15 with cardiogenic shock and AF w/ RVR.  PCCM asked to assist early AM 12/16 due to increased WOB.   Past Medical History  has Cardiogenic shock (HCC); Acute systolic heart failure (HCC); AKI (acute kidney injury) (HCC); Atrial flutter (HCC); Acute respiratory failure with hypoxia (HCC); and Acute on chronic combined systolic and diastolic congestive heart failure (HCC) on their problem list.  Significant Hospital Events   12/15 > transferred from [facility].  12/15 with cardiogenic shock 2/2 A.flutter / atrial tachycardia with RVR.  He had been given 25mg  diltiazem in ED and became markedly hypotensive.  DC-CV attempted but unsuccessful.  He was started on NE as well as empiric vanc / cefepime.Post arrival to [facility] he was hypertensive so NE stopped.  Given 150mg  amio but dropped SBP to 60's so NE restarted.  Had bedside echo by Dr. [facility] which showed LVEF ~ 25% with decreased RV function.   Early AM 12/16, he developed increased WOB.  He was started on BiPAP by [facility] and we were asked to evaluate him for intubation. Had progressive lactic acidosis.   He was also given 2amps HCO3 and started on vasopressin.  MAP's improved to low 60's. Cardioverted about 0630. Pressor requirements improved down to 3 mcg/min NE. vasporessin off.   Consults:  PCCM.  Procedures:  L IJ CVL 12/16 >  R radial art line 12/16 >   Significant Diagnostic Tests:  CXR 12/15 > cardiomegaly, trace effusions. CT head 12/15 > remote left thalamic lacunar infarct.  No acute process. Echo 12/16 >   Micro Data:  Blood 12/15 >  Urine 12/15 >  Flu 12/15 > neg. SARS CoV2 12/15 > neg.  Antimicrobials:  Vanc 12/15 >  Cefepime 12/15 >    Interim  history/subjective:  Now off BIPAP. Had been on over night   Objective:  Blood pressure (Abnormal) 155/55, pulse 70, temperature 99.5 F (37.5 C), resp. rate 17, height 5\' 11"  (1.803 m), weight 74.4 kg, SpO2 94 %. CVP:  [6 mmHg-30 mmHg] 7 mmHg      Intake/Output Summary (Last 24 hours) at 08/29/2019 0959 Last data filed at 08/29/2019 0848 Gross per 24 hour  Intake 975.19 ml  Output 915 ml  Net 60.19 ml   Filed Weights   08/29/19 0450  Weight: 74.4 kg    Examination: General: Elderly adult male on bipap. Neuro: Nods answers to questions appropriately. HEENT: Limited due to bipap.  PERRL. Cardiovascular: S1, S2 no MGR.  NSR 70's.  Distal pulses intact. Lungs: Clear bilat, diminished bases.   Abdomen: Soft, NT, +BS  Musculoskeletal: No edema. Skin: Intact, warm, no rashes.  Assessment & Plan:   Cardiogenic Shock in the setting of uncontrolled AF/AFL and hypothermia  Has hx of AF.  Failed diltiazem, failed cardioversion.  Bedside echo (while in AF 190's) EF ~ 25%, most recent formal echo 2009 EF >55%.  Converted to NSR this am s/p amio gtt.   Core temp 89.2 on arrival to [facility] from [facility], now 99.5.  Co-ox now 59.  Vaso off, norepi at 3 mcg. Plan Defer Inotropes to cards (SVO2 still 59%) Keep in SR> looks like we are involving EP Wean norepi for MAP > 60  Continue amio and heparin F/u formal echo scheduled today Cont tele   Acute  respiratory failure 2/2 circulatory shock and metabolic acidosis  pcxr w/out edema. Mild volume loss at base. Personally reviewed  Now off BIPAP. Good respiratory compensation noted.  Plan BIPAP PRN for work of breathing Wean to nasal cannula for sats >92% Pulmonary toilet  Anion gap metabolic acidosis (severe lactic acidosis) AonCKD Lactate 3.7. Baseline Cr 1.3, now 3.45  HCO3 improved to 21 with bicarb gtt at 75 mL/hr.  UOP picking up significantly.  Avoiding CRRT for now. Plan Follow BMP and lactic acid again at noon, can probably dc bicarb gtt  at that time.  Cont maximize CO  R/o sepsis Plan Continue vanc and cefepime for now, can prob dc in am  F/u blood and urine cultures  Hx DM Plan  SSI  Best Practice:  Diet: NPO. Pain/Anxiety/Delirium protocol (if indicated): N/A. VAP protocol (if indicated): N/A DVT prophylaxis: SCD's / Heparin. GI prophylaxis: N/A. Glucose control: SSI. Mobility: Bedrest. Code Status: Full. Family Communication: None available. Disposition: ICU.  cct 34 minutes.   Erick Colace ACNP-BC [place] Pager # [phone] OR # [phone] if no answer

## 2019-08-29 NOTE — Progress Notes (Signed)
Patient noted to have some bleeding from left IJ cvc site. Dressing changed at 1500. Dressing clean, dry, intact. At 1700, patient's cvc site was noted to have more bleeding. Dr. Elsworth Soho on the unit and notified. Stated to change dressing and use thrombipad. Also will pause heparin drip from 1700-1900 and restart at 1900. Pharmacist aware.  Joellen [place], RN

## 2019-08-29 NOTE — Progress Notes (Signed)
Initially was called for code sepsis but no order noted, pt was transferred to [facility] due to cardiogenic shock>>Dr. Lucile Shutters cancelled code sepsis.  [employer]

## 2019-08-29 NOTE — Progress Notes (Signed)
  Amiodarone Drug - Drug Interaction Consult Note  Recommendations: Monitor Amiodarone is metabolized by the cytochrome P450 system and therefore has the potential to cause many drug interactions. Amiodarone has an average plasma half-life of 50 days (range 20 to 100 days).   There is potential for drug interactions to occur several weeks or months after stopping treatment and the onset of drug interactions may be slow after initiating amiodarone.   [x]  Statins: Increased risk of myopathy. Simvastatin- restrict dose to 20mg  daily. Other statins: counsel patients to report any muscle pain or weakness immediately.  Sherlon Handing, PharmD, BCPS Please see amion for complete clinical pharmacist phone list 08/29/2019 2:04 AM

## 2019-08-29 NOTE — Progress Notes (Signed)
ANTICOAGULATION CONSULT NOTE - Follow-Up Consult  Pharmacy Consult for heparin Indication: atrial fibrillation  No Known Allergies  Patient Measurements: Height: 5\' 11"  (180.3 cm) Weight: 164 lb 0.4 oz (74.4 kg) IBW/kg (Calculated) : 75.3 Heparin Dosing Weight: 74kg  Vital Signs: Temp: 99.5 F (37.5 C) (12/16 0715) Temp Source: Bladder (12/16 0500) BP: 155/55 (12/16 0808) Pulse Rate: 64 (12/16 1045)  Labs: Recent Labs    08/28/19 1919 08/28/19 2126 08/29/19 0059 08/29/19 0245 08/29/19 0413 08/29/19 0802 08/29/19 0804 08/29/19 1106  HGB 10.6*  --  10.6* 10.2* 10.5* 11.2*  --   --   HCT 31.7*  --  32.9* 30.0* 31.0* 33.0*  --   --   PLT 184  --  179  --   --   --   --   --   LABPROT  --   --  19.0*  --   --   --   --   --   INR  --   --  1.6*  --   --   --   --   --   HEPARINUNFRC  --   --   --   --   --   --   --  1.20*  CREATININE 3.32*  --  3.42*  --   --   --  3.45* 3.16*  TROPONINIHS 35* 40* 65*  --   --   --   --   --     Estimated Creatinine Clearance: 19 mL/min (A) (by C-G formula based on SCr of 3.16 mg/dL (H)).   Medical History: Past Medical History:  Diagnosis Date  . Arthritis    shoulder  . Asthma   . Atrial fibrillation ([place])   . Chronic kidney disease    stage III  . COPD (chronic obstructive pulmonary disease) ([place])   . Coronary artery disease   . Diabetes mellitus without complication ([place])    type 2  . Dysrhythmia    [facility] HEART IRREGULARLY IRREGULAR  . Full dentures    upper and lower  . Hypercholesteremia   . Hypertension   . Myocardial infarction ([place])   . Shortness of breath dyspnea   . Swelling    FEET AND LEGS     Assessment: 77 yoM admitted with cardiogenic shock and AFib s/p DCCV. Pt started on IV heparin, initial heparin level elevated at 1.2, CBC stable.  Goal of Therapy:  Heparin level 0.3-0.7 units/ml Monitor platelets by anticoagulation protocol: Yes   Plan:  -Hold heparin x1hr -Reduce  heparin to 800 units/h -Recheck heparin level in [place], PharmD, BCPS Clinical Pharmacist [phone] Please check AMION for all [facility] Pharmacy numbers 08/29/2019

## 2019-08-29 NOTE — Progress Notes (Signed)
CoOx reading came back at 31%. Pt's WOB increasing & pt becoming increasingly restless in bed. ART line reading low despite increase in levophed. Cards fellow paged. Elink button pushed to camera in.  Dr. Lois Huxley to camera in and give new orders. Cards fellow paged back and gave new orders for vaso & to place pt on bipap.   Bipap helped pt's WOB & restlessness. Ground team CCM to come see pt.

## 2019-08-29 NOTE — Progress Notes (Signed)
  Maintaining NSR on IV amio.  Remains on bicarb drip at 75  but off NE and VP. BP soft in 90s   Making urine.   Last lactate still elevated at 3.2.  Co-ox improved to 59%  ABG 7.47/29/142/99%  Tenuous but symptomatically improved.   Will continue IV amio. Decrease bicarb to 50. Repeat lactic acid.  Low threshold to restart NE as needed for cardiac and BP support.   Additional CCT 30 mins throughout the afternoon.   Glori Bickers, MD  5:14 PM

## 2019-08-29 NOTE — Consult Note (Addendum)
ELECTROPHYSIOLOGY CONSULT NOTE    Patient ID: [name] [name] MRN: [id number], DOB/AGE: April 26, 1935 84 y.o.  Admit date: 08/28/2019 Date of Consult: 08/29/2019  Primary Physician: Patient, No Pcp Per Primary Cardiologist: Dionne Bucy HF: Dr. Haroldine Laws  Electrophysiologist: New   Referring Provider: Dr. Haroldine Laws   Patient Profile: [name] [name] is a 84 y.o. male with a history of DM2, HTN, HL, CAD, Chronic AF, and CKD 3 who was transferred from [facility] for further management of cardiogenic shock in setting AFL/tach with RVR who is being seen today for the evaluation of AFL ? AVNRT at the request of Dr. Haroldine Laws   HPI:  [name] [name] is a 84 y.o. male with medical history as above. He presented to [facility] with worsening SOB and found to have AFL/AF with RVR with rates in 190s. Was also hypothermic with temp of 88s and "smelled like kerosene".   He became markedly hypotensive with diltiazem, and DCCV ineffective. Started on broad spectrum ABX (Vanc/cefipeme) and Norepi.   Pertinent labs on admission included lactate 7.8 ABG 7.25/19/65/88% with bicarb 8. Cr 3.3 k 5.2. hstrop 40 BNP 1,337  PCT 0.15     Pt received central and arterial lines; elevated CVP and bedside echo showed LVEF ~ 25% with RV dysfunction.   Pt is feeling better this am. He converted to NSR around 0615 this am on IV amiodarone.  Remains on NE, but RN attempting wean. UOP improved.  Remains Weak, no SOB at rest.   Past Medical History:  Diagnosis Date  . Arthritis    shoulder  . Asthma   . Atrial fibrillation ([place])   . Chronic kidney disease    stage III  . COPD (chronic obstructive pulmonary disease) ([place])   . Coronary artery disease   . Diabetes mellitus without complication ([place])    type 2  . Dysrhythmia    [facility] HEART IRREGULARLY IRREGULAR  . Full dentures    upper and lower  . Hypercholesteremia   . Hypertension   . Myocardial infarction ([place])   . Shortness of breath dyspnea   .  Swelling    FEET AND LEGS     Surgical History:  Past Surgical History:  Procedure Laterality Date  . CARDIAC CATHETERIZATION    . CATARACT EXTRACTION W/PHACO Right 01/28/2016   Procedure: CATARACT EXTRACTION PHACO AND INTRAOCULAR LENS PLACEMENT ([place]) right eye;  Surgeon: Leandrew Koyanagi, MD;  Location: [place];  Service: Ophthalmology;  Laterality: Right;  DIABETIC-oral med MALYUGIN  . RETINAL DETACHMENT SURGERY       Medications Prior to Admission  Medication Sig Dispense Refill Last Dose  . allopurinol (ZYLOPRIM) 100 MG tablet Take 100 mg by mouth daily.     Marland Kitchen amLODipine (NORVASC) 10 MG tablet Take 10 mg by mouth daily. am     . aspirin 81 MG tablet Take 81 mg by mouth daily.     . betamethasone dipropionate (DIPROLENE) 0.05 % cream Apply topically 2 (two) times daily.     . carvedilol (COREG) 12.5 MG tablet Take 12.5 mg by mouth 2 (two) times daily with a meal.     . clopidogrel (PLAVIX) 75 MG tablet Take 75 mg by mouth daily.     Marland Kitchen diltiazem (TIAZAC) 300 MG 24 hr capsule Take 300 mg by mouth daily.     . furosemide (LASIX) 20 MG tablet Take 20 mg by mouth.     Marland Kitchen glipiZIDE (GLUCOTROL) 10 MG tablet Take 10 mg by mouth  daily before breakfast.     . lisinopril (PRINIVIL,ZESTRIL) 5 MG tablet Take 5 mg by mouth daily.     . potassium chloride (KLOR-CON) 20 MEQ packet Take by mouth 2 (two) times daily.     . pravastatin (PRAVACHOL) 40 MG tablet Take 40 mg by mouth daily.       Inpatient Medications:  . aspirin  81 mg Oral Daily  . chlorhexidine  15 mL Mouth Rinse BID  . Chlorhexidine Gluconate Cloth  6 each Topical Daily  . insulin aspart  0-9 Units Subcutaneous TID WC  . mouth rinse  15 mL Mouth Rinse q12n4p  . pravastatin  40 mg Oral q1800  . sodium chloride flush  3 mL Intravenous Q12H    Allergies: No Known Allergies  Social History   Socioeconomic History  . Marital status: Single    Spouse name: Not on file  . Number of children: Not on file  . Years  of education: Not on file  . Highest education level: Not on file  Occupational History  . Not on file  Tobacco Use  . Smoking status: Former Smoker    Packs/day: 0.25    Years: 50.00    Pack years: 12.50    Types: Cigarettes  . Smokeless tobacco: Current User    Types: Chew  Substance and Sexual Activity  . Alcohol use: No  . Drug use: No  . Sexual activity: Not on file  Other Topics Concern  . Not on file  Social History Narrative  . Not on file   Social Determinants of Health   Financial Resource Strain:   . Difficulty of Paying Living Expenses: Not on file  Food Insecurity:   . Worried About Programme researcher, broadcasting/film/videounning Out of Food in the Last Year: Not on file  . Ran Out of Food in the Last Year: Not on file  Transportation Needs:   . Lack of Transportation (Medical): Not on file  . Lack of Transportation (Non-Medical): Not on file  Physical Activity:   . Days of Exercise per Week: Not on file  . Minutes of Exercise per Session: Not on file  Stress:   . Feeling of Stress : Not on file  Social Connections:   . Frequency of Communication with Friends and Family: Not on file  . Frequency of Social Gatherings with Friends and Family: Not on file  . Attends Religious Services: Not on file  . Active Member of Clubs or Organizations: Not on file  . Attends BankerClub or Organization Meetings: Not on file  . Marital Status: Not on file  Intimate Partner Violence:   . Fear of Current or Ex-Partner: Not on file  . Emotionally Abused: Not on file  . Physically Abused: Not on file  . Sexually Abused: Not on file     No family history on file.   Review of Systems: All other systems reviewed and are otherwise negative except as noted above.  Physical Exam: Vitals:   08/29/19 1200 08/29/19 1215 08/29/19 1230 08/29/19 1245  BP: 104/61 103/66 (!) 97/57 (!) 119/58  Pulse: 65 65 65 66  Resp: 15 20 16 14   Temp: 99.1 F (37.3 C) 99.3 F (37.4 C) 99.1 F (37.3 C) 99.1 F (37.3 C)  TempSrc:       SpO2: 100% 100% 100% 100%  Weight:      Height:        GEN- The patient is well appearing, alert and oriented x 3 today.  HEENT: normocephalic, atraumatic; sclera clear, conjunctiva pink; hearing intact; oropharynx clear; neck supple Lungs- Clear to ausculation bilaterally, normal work of breathing.  No wheezes, rales, rhonchi Heart- Regular rate and rhythm, no murmurs, rubs or gallops GI- soft, non-tender, non-distended, bowel sounds present Extremities- no clubbing, cyanosis, or edema; DP/PT/radial pulses 2+ bilaterally MS- no significant deformity or atrophy Skin- warm and dry, no rash or lesion Psych- euthymic mood, full affect Neuro- strength and sensation are intact  Labs:   Lab Results  Component Value Date   WBC 13.9 (H) 08/29/2019   HGB 11.2 (L) 08/29/2019   HCT 33.0 (L) 08/29/2019   MCV 90.9 08/29/2019   PLT 179 08/29/2019    Recent Labs  Lab 08/29/19 0059 08/29/19 0109 08/29/19 1106  NA 140  --  143  K 5.1  --  3.7  CL 109   < > 105  CO2 13*   < > 20*  BUN 35*   < > 39*  CREATININE 3.42*   < > 3.16*  CALCIUM 8.6*   < > 8.4*  PROT 6.1*  --   --   BILITOT 0.9  --   --   ALKPHOS 83  --   --   ALT 63*  --   --   AST 77*  --   --   GLUCOSE 217*   < > 232*   < > = values in this interval not displayed.      Radiology/Studies: CT Head Wo Contrast  Result Date: 08/28/2019 CLINICAL DATA:  Altered mental status, increasing shortness of breath, EXAM: CT HEAD WITHOUT CONTRAST TECHNIQUE: Contiguous axial images were obtained from the base of the skull through the vertex without intravenous contrast. COMPARISON:  CT head 12/16/2008 FINDINGS: Brain: Ovoid region of hypoattenuation in the left thalamus has an appearance most consistent with a remote lacunar infarct. There is dense mineralization of the globus pallidus compatible with senescent change. No evidence of acute infarction, hemorrhage, hydrocephalus, extra-axial collection or mass lesion/mass effect.  Symmetric prominence of the ventricles, cisterns and sulci compatible with advanced parenchymal volume loss. Confluent areas of white matter hypoattenuation are most compatible with chronic microvascular angiopathy. Vascular: Atherosclerotic calcification of the carotid siphons. No hyperdense vessel. Skull: No calvarial fracture or suspicious osseous lesion. No scalp swelling or hematoma. Sinuses/Orbits: Paranasal sinuses and mastoid air cells are predominantly clear. Included orbital structures are unremarkable. Other: None IMPRESSION: 1. 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 2. No acute intracranial abnormality. 3. Advanced parenchymal volume loss and chronic microvascular angiopathy. Electronically Signed   By: Kreg Shropshire M.D.   On: 08/28/2019 21:35   DG CHEST PORT 1 VIEW  Result Date: 08/29/2019 CLINICAL DATA:  PICC placement. EXAM: PORTABLE CHEST 1 VIEW COMPARISON:  Chest radiograph yesterday. FINDINGS: Left internal jugular central venous catheter tip projects over the mid SVC. No pneumothorax. Low lung volumes accentuating cardiac size. Tortuosity and atherosclerosis of the thoracic aorta. Stable blunting of costophrenic angles. No pulmonary edema. There is biapical pleuroparenchymal scarring. IMPRESSION: 1. Left internal jugular central venous catheter tip projects over the mid SVC. No pneumothorax. 2. Lower lung volumes from prior exam. Stable blunting of costophrenic angles. 3.  Aortic Atherosclerosis (ICD10-I70.0). Electronically Signed   By: Narda Rutherford M.D.   On: 08/29/2019 01:13   DG Chest Portable 1 View  Result Date: 08/28/2019 CLINICAL DATA:  Shortness of breath EXAM: PORTABLE CHEST 1 VIEW COMPARISON:  01/08/2009 FINDINGS: The heart size is enlarged. There is no pneumothorax. There are  likely small bilateral pleural effusions, left greater than right. Bibasilar airspace opacities are noted and are favored to represent atelectasis. There is pleuroparenchymal scarring at the  lung apices. Aortic calcifications are noted. IMPRESSION: 1. No acute cardiopulmonary process. 2. Cardiomegaly without overt edema. 3. Bibasilar atelectasis and possible trace bilateral pleural effusions. Electronically Signed   By: Katherine Mantle M.D.   On: 08/28/2019 19:39    EKG: this am shows ? AVNRT at 183 bpm -> post termination pause -> sinus beat -> re-initiation of AVNRT (personally reviewed)  TELEMETRY: Reviewed with Dr. Elberta Fortis. Appears to show rapid AFL vs AVNRT in the 170-180 range with frequent post termination pauses.  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  Pt converted to NSR ~ 0615 this am with IV amiodarone. Continue to load.  He could potentially be a candidate for ablation pending his disposition and recovery. Ideally, would stabilize on po medications, and see and follow up as an outpatient to discuss/plan ablation.   2. Acute biventricular CHF -> cardiogenic shock Suspect tachy induced Formal echo pending (Bedside with LVEF 25% and severe RV dysfunction) Volume status improved with diuretics and restoration of NSR.  Pressors weaning as tolerated, per HF team. Continue amiodarone.   3. CAD Reported history with no clear notes Stable, and HS-trop relatively stable despite hemodynamic changes. (35 -> 40 -> 65)  4. AKI on CKD In setting of ATN from shock  For questions or updates, please contact [facility] Please consult [email or web address] for contact info under Cardiology/STEMI.  Signed, Graciella Freer, PA-C  08/29/2019 1:04 PM    I have seen and examined this patient with Otilio Saber.  Agree with above, note added to reflect my findings.  On exam, RRR, no murmurs, lungs clear.   Patient presented to an outside hospital with increasing shortness  of breath, and atrial fibrillation/flutter with heart rates into the 190s.  He was reportedly hypothermic with temperatures around 88.  He was given IV diltiazem and became hypotensive.  He was transferred to [facility].  Review of telemetry shows sinus rhythm with what appears to be a narrow complex tachycardia during most of the night.  He would be in his narrow complex tachycardia have one sinus beat and immediately go back into his tachycardia.  It appears that his tachycardia has a right bundle branch block morphology, which could be due to aberrant conduction.  He is currently on amiodarone, and his rhythm has stabilized.  Depending on his overall course, he would potentially be a candidate for ablation as an outpatient.  Would continue amiodarone load currently.  Asusena Sigley M. Leianna Barga MD 08/29/2019 3:13 PM

## 2019-08-29 NOTE — H&P (Addendum)
Advanced Heart Failure Team History and Physical Note   PCP:  Patient, No Pcp Per  PCP-Cardiology: [facility]  Reason for Admission: Cardiogenic shock   HPI:    84 y/o male with ho. DM2, HTN. HL, CAD, chronic AF and CKD 3a (baseline creatinine 1.3-1.8) transferred from [facility] fur further management of cardiogenic shock in setting of atrial flutter/tach with RVR.  Has been living alone at home and doing relatively well. Over past few days has had increasing SOB. Went to [facility] ER and fount to be AFL/AF with RVR with rates in 190s. Reportedly was hypothermic with temp of 88 degrees and smelled like kerosene.  Given 25mg  of IV diltiazem in ER and became markedly hypotensive. Underwent attempted DC-CV without effect. Felt to have code sepsis as well. Started on NE and vanc/cefipime. Given 1.5 L of NS.   Labs back with lactate 7.8 ABG 7.25/19/65/88% with bicarb 8. Cr 3.3 k 5.2. hstrop 40 BNP 1,337  PCT 0.15  During transfer patient hypertensive with SBP 187. NE stopped. On arrival patient communicative. NAD. AFL on monitor at 190 with frequent prief post-termiantion pauses.   Given 150 IV amio and dropped SBP to 60. NE restarted.   Central line and arterial line placed. CVP 23.   CXR clear.   Denies recent fevers, chills, diarrhea, CP, n/v.   I did bedside echo EF hard to assess due to tachycardia. LVEF ~25 RV down. (last echo 2009 EF >55%. Had echo more recently but I cannot find exact results. Per dr. Alveria Apley note had moderate MR).   Review of Systems: [y] = yes, [ ]  = no   General: Weight gain [ ] ; Weight loss [ ] ; Anorexia [ y]; Fatigue [ y]; Fever [ ] ; Chills [ ] ; Weakness Blue.Reese ]  Cardiac: Chest pain/pressure [ ] ; Resting SOB Blue.Reese ]; Exertional SOB Blue.Reese ]; Orthopnea [ ] ; Pedal Edema [ ] ; Palpitations [ ] ; Syncope [ ] ; Presyncope [ ] ; Paroxysmal nocturnal dyspnea[ ]   Pulmonary: Cough [ ] ; Wheezing[ ] ; Hemoptysis[ ] ; Sputum [ ] ; Snoring [ ]   GI: Vomiting[ ] ; Dysphagia[ ] ; Melena[ ] ;  Hematochezia [ ] ; Heartburn[ ] ; Abdominal pain [ ] ; Constipation [ ] ; Diarrhea [ ] ; BRBPR [ ]   GU: Hematuria[ ] ; Dysuria [ ] ; Nocturia[ ]   Vascular: Pain in legs with walking [ ] ; Pain in feet with lying flat [ ] ; Non-healing sores [ ] ; Stroke [ ] ; TIA [ ] ; Slurred speech [ ] ;  Neuro: Headaches[ ] ; Vertigo[ ] ; Seizures[ ] ; Paresthesias[ ] ;Blurred vision [ ] ; Diplopia [ ] ; Vision changes [ ]   Ortho/Skin: Arthritis Blue.Reese ]; Joint pain Blue.Reese ]; Muscle pain [ ] ; Joint swelling [ ] ; Back Pain [ ] ; Rash [ ]   Psych: Depression[ ] ; Anxiety[ ]   Heme: Bleeding problems [ ] ; Clotting disorders [ ] ; Anemia [ ]   Endocrine: Diabetes Blue.Reese ]; Thyroid dysfunction[ ]    Home Medications Prior to Admission medications   Medication Sig Start Date End Date Taking? Authorizing Provider  allopurinol (ZYLOPRIM) 100 MG tablet Take 100 mg by mouth daily.    [provider]  amLODipine (NORVASC) 10 MG tablet Take 10 mg by mouth daily. am    [provider]  aspirin 81 MG tablet Take 81 mg by mouth daily.    [provider]  betamethasone dipropionate (DIPROLENE) 0.05 % cream Apply topically 2 (two) times daily.    [provider]  carvedilol (COREG) 12.5 MG tablet Take 12.5 mg by mouth 2 (two) times daily  with a meal.    [provider]  clopidogrel (PLAVIX) 75 MG tablet Take 75 mg by mouth daily.    [provider]  diltiazem (TIAZAC) 300 MG 24 hr capsule Take 300 mg by mouth daily.    [provider]  furosemide (LASIX) 20 MG tablet Take 20 mg by mouth.    [provider]  glipiZIDE (GLUCOTROL) 10 MG tablet Take 10 mg by mouth daily before breakfast.    [provider]  lisinopril (PRINIVIL,ZESTRIL) 5 MG tablet Take 5 mg by mouth daily.    [provider]  potassium chloride (KLOR-CON) 20 MEQ packet Take by mouth 2 (two) times daily.    [provider]  pravastatin (PRAVACHOL) 40 MG tablet Take 40 mg by mouth daily.    [provider]    Past Medical History: Past Medical History:  Diagnosis Date  . Arthritis    shoulder  . Asthma   . Atrial fibrillation (HCC)   . Chronic kidney disease    stage III  . COPD (chronic obstructive pulmonary disease) (HCC)   . Coronary artery disease   . Diabetes mellitus without complication (HCC)    type 2  . Dysrhythmia    PER [facility] HE[facility] IRREGULARLY IRREGULAR  . Full dentures    upper and lower  . Hypercholesteremia   . Hypertension   . Myocardial infarction (HCC)   . Shortness of breath dyspnea   . Swelling    FEET AND LEGS    Past Surgical History: Past Surgical History:  Procedure Laterality Date  . CARDIAC CATHETERIZATION    . CATARACT EXTRACTION W/PHACO Right 01/28/2016   Procedure: CATARACT EXTRACTION PHACO AND INTRAOCULAR LENS PLACEMENT (IOC) right eye;  Surgeon: Lockie Mola, MD;  Location[facility];  Service: Ophthalmology;  Laterality: Right;  DIABETIC-oral med MALYUGIN  . RETINAL DETACHMENT SURGERY      Family History:  Denies family h/o SCD or premature CAD  Social History: Social History   Socioeconomic History  . Marital status: Single    Spouse name: Not on file  . Number of children: Not on file  . Years of education: Not on file  . Highest education level: Not on file  Occupational History  . Not on file  Tobacco Use  . Smoking status: Former Smoker    Packs/day: 0.25    Years: 50.00    Pack years: 12.50    Types: Cigarettes  . Smokeless tobacco: Current User    Types: Chew  Substance and Sexual Activity  . Alcohol use: No  . Drug use: No  . Sexual activity: Not on file  Other Topics Concern  . Not on file  Social History Narrative  . Not on file   Social Determinants of Health   Financial Resource Strain:   . Difficulty of Paying Living Expenses: Not on file  Food Insecurity:   . Worried About Programme researcher, broadcasting/film/video in the Last Year: Not on file  . Ran Out of Food in the Last  Year: Not on file  Transportation Needs:   . Lack of Transportation (Medical): Not on file  . Lack of Transportation (Non-Medical): Not on file  Physical Activity:   . Days of Exercise per Week: Not on file  . Minutes of Exercise per Session: Not on file  Stress:   . Feeling of Stress : Not on file  Social Connections:   . Frequency of Communication with Friends and Family: Not  on file  . Frequency of Social Gatherings with Friends and Family: Not on file  . Attends Religious Services: Not on file  . Active Member of Clubs or Organizations: Not on file  . Attends BankerClub or Organization Meetings: Not on file  . Marital Status: Not on file    Allergies:  No Known Allergies  Objective:    Vital Signs:   Temp:  [89.2 F (31.8 C)-91.5 F (33.1 C)] 90.6 F (32.6 C) (12/15 2239) Pulse Rate:  [123-170] 143 (12/15 2239) Resp:  [17-28] 17 (12/15 2239) BP: (72-166)/(51-145) 102/74 (12/15 2239) SpO2:  [80 %-100 %] 100 % (12/15 2239) Weight:  [77.1 kg] 77.1 kg (12/15 1913)    Physical Exam     General:  Tin male. Acutely ill. Shivering. No respiratory difficulty HEENT: Normal Neck: Supple. JVP . Carotids 2+ bilat; no bruits. No lymphadenopathy or thyromegaly appreciated. Cor: PMI nondisplaced. Tachy regular + s3 Lungs: Clear Abdomen: Soft, nontender, nondistended. No hepatosplenomegaly. No bruits or masses. Good bowel sounds. Extremities: No cyanosis, clubbing, rash, edema cool Neuro: Alert & oriented x 3, cranial nerves grossly intact. moves all 4 extremities w/o difficulty. Affect pleasant.   Telemetry   Probable AFL/a tach with frequent post termination pauses with 1 beat of sinus then resumes. ,dbpr   EKG   Probable AFL/atrial tach with RBBB 143. + long post-termination pause. Personally reviewed   Labs     Basic Metabolic Panel: Recent Labs  Lab 08/28/19 1919  NA 140  K 5.2*  CL 111  CO2 11*  GLUCOSE 143*  BUN 33*  CREATININE 3.32*  CALCIUM 8.2*  MG 2.1   PHOS 6.3*    Liver Function Tests: Recent Labs  Lab 08/28/19 1919  AST 75*  ALT 51*  ALKPHOS 85  BILITOT 1.1  PROT 6.1*  ALBUMIN 3.4*   No results for input(s): LIPASE, AMYLASE in the last 168 hours. No results for input(s): AMMONIA in the last 168 hours.  CBC: Recent Labs  Lab 08/28/19 1919  WBC 10.9*  NEUTROABS 8.4*  HGB 10.6*  HCT 31.7*  MCV 85.7  PLT 184    Cardiac Enzymes: No results for input(s): CKTOTAL, CKMB, CKMBINDEX, TROPONINI in the last 168 hours.  BNP: BNP (last 3 results) Recent Labs    08/28/19 1919  BNP 1,337.0*    ProBNP (last 3 results) No results for input(s): PROBNP in the last 8760 hours.   CBG: Recent Labs  Lab 08/28/19 1911  GLUCAP 114*    Coagulation Studies: No results for input(s): LABPROT, INR in the last 72 hours.  Imaging: CT Head Wo Contrast  Result Date: 08/28/2019 CLINICAL DATA:  Altered mental status, increasing shortness of breath, EXAM: CT HEAD WITHOUT CONTRAST TECHNIQUE: Contiguous axial images were obtained from the base of the skull through the vertex without intravenous contrast. COMPARISON:  CT head 12/16/2008 FINDINGS: Brain: Ovoid region of hypoattenuation in the left thalamus has an appearance most consistent with a remote lacunar infarct. There is dense mineralization of the globus pallidus compatible with senescent change. No evidence of acute infarction, hemorrhage, hydrocephalus, extra-axial collection or mass lesion/mass effect. Symmetric prominence of the ventricles, cisterns and sulci compatible with advanced parenchymal volume loss. Confluent areas of white matter hypoattenuation are most compatible with chronic microvascular angiopathy. Vascular: Atherosclerotic calcification of the carotid siphons. No hyperdense vessel. Skull: No calvarial fracture or suspicious osseous lesion. No scalp swelling or hematoma. Sinuses/Orbits: Paranasal sinuses and mastoid air cells are predominantly clear. Included orbital  structures  are unremarkable. Other: None IMPRESSION: 1. 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 2. No acute intracranial abnormality. 3. Advanced parenchymal volume loss and chronic microvascular angiopathy. Electronically Signed   By: Kreg Shropshire M.D.   On: 08/28/2019 21:35   DG Chest Portable 1 View  Result Date: 08/28/2019 CLINICAL DATA:  Shortness of breath EXAM: PORTABLE CHEST 1 VIEW COMPARISON:  01/08/2009 FINDINGS: The heart size is enlarged. There is no pneumothorax. There are likely small bilateral pleural effusions, left greater than right. Bibasilar airspace opacities are noted and are favored to represent atelectasis. There is pleuroparenchymal scarring at the lung apices. Aortic calcifications are noted. IMPRESSION: 1. No acute cardiopulmonary process. 2. Cardiomegaly without overt edema. 3. Bibasilar atelectasis and possible trace bilateral pleural effusions. Electronically Signed   By: Katherine Mantle M.D.   On: 08/28/2019 19:39     Assessment/Plan    1. Acute systolic HF with severe biventricular dysfunction  -> cardiogenic shock - suspect tachy-induced due to AFL/atrial tach - Bedside echo 12/16 EF hard to assess due to tachycardia but likely 25% with severe RV dysfunction - Volume status elevated but respiratory status stable - Support with NE for now - Follow CVP and co-ox - Formal echo in am. Diurese as tolerated  - Start amio for AFl - Doubt sepsis but will continue Abx for 48 hour until cx result  2. Atrial tach/AFL with RVR and post-termination pauses - has h/o chronic AF but I doubt this is his AF - failed DC-CV in ER on 12/15 - start IV amio - heparin - keep Zoll's at bedside for pauses. May need EP to see  - (only on ASA/plavix at home) will need full AC  3. CAD - details unclear.  - currently stable.  - troponins ok despite marked hemodynamic stress - ASA/statin   4. AKI on CKD3 - baseline creatinine 1.3-1.8 - due to ATN from shock -  supportive care - may need CVVHD  5. DM2 - cover with SSI - check Hgba1c  6. Smell of kerosene - check co-ox for methgb  CRITICAL CARE Performed by: Arvilla Meres  Total critical care time: 90 minutes  Critical care time was exclusive of separately billable procedures and treating other patients.  Critical care was necessary to treat or prevent imminent or life-threatening deterioration.  Critical care was time spent personally by me (independent of midlevel providers or residents) on the following activities: development of treatment plan with patient and/or surrogate as well as nursing, discussions with consultants, evaluation of patient's response to treatment, examination of patient, obtaining history from patient or surrogate, ordering and performing treatments and interventions, ordering and review of laboratory studies, ordering and review of radiographic studies, pulse oximetry and re-evaluation of patient's condition.    Arvilla Meres, MD 08/29/2019, 1:14 AM  Advanced Heart Failure Team Pager [phone] (M-F; 7a - 4p)  Please contact [facility] for night-coverage after hours (4p -7a ) and weekends on [email or web address]

## 2019-08-29 NOTE — Progress Notes (Addendum)
Pharmacy Antibiotic/Anticoagulation Note  [name] [name] is a 84 y.o. male admitted on 08/28/2019 with sepsis.  Pharmacy has been consulted for Vancomycin and Cefepime dosing.  Vanc 1gm IV and Cefepime 2gm given at [facility] ~2230  Scr up to 3.32, est CrCl 18 ml/min (baseline SCr 1.3-1.8)  Plan: Cefepime 2gm IV q24h Vancomycin 500mg  IV now to complete total of 1500 mg IV load. Further doses based on renal function Will f/u micro data and pt's clinical condition Vanc levels prn  Addendum (0205): Pt also started on heparin at [facility] 12/15 ~2000. Heparin paused here from 0100-0200 for placement of central line. Confirmed with on call MD that he would like to resume.  Plan: Restart heparin at 1050 units/hr F/u 8hr heparin level past restart Daily heparin level and CBC      Temp (24hrs), Avg:92.8 F (33.8 C), Min:89.2 F (31.8 C), Max:94.1 F (34.5 C)  Recent Labs  Lab 08/28/19 1919 08/28/19 2126 08/29/19 0059  WBC 10.9*  --  13.9*  CREATININE 3.32*  --   --   LATICACIDVEN 7.8* 7.8*  --     Estimated Creatinine Clearance: 18.3 mL/min (A) (by C-G formula based on SCr of 3.32 mg/dL (H)).    No Known Allergies  Antimicrobials this admission: 12/15 Vanc >>  12/15 Cefepime >>   Microbiology results: 12/15 BCx:  12/15 SARS coronavirus 2: negative  Thank you for allowing pharmacy to be a part of this patient's care.  Sherlon Handing, PharmD, BCPS Please see amion for complete clinical pharmacist phone list 08/29/2019 1:44 AM

## 2019-08-29 NOTE — Plan of Care (Signed)
  Problem: Education: Goal: Ability to demonstrate management of disease process will improve Outcome: Not Progressing Goal: Ability to verbalize understanding of medication therapies will improve Outcome: Not Progressing Goal: Individualized Educational Video(s) Outcome: Not Progressing   Problem: Activity: Goal: Capacity to carry out activities will improve Outcome: Not Progressing   Problem: Cardiac: Goal: Ability to achieve and maintain adequate cardiopulmonary perfusion will improve Outcome: Not Progressing   

## 2019-08-29 NOTE — Significant Event (Signed)
Cardiology Moonlighter Note  Patient seen and examined. HR's remain frequently in the 200's with hemodynamic compromise. Has frequent sinus beats during which his pulse pressure improves dramatically. His rhythm temporarily improves with bicarb pushes. Starting bicarb drip now. Attempting to avoid further escalation of inotropes given his supraventricular arrhythmia.   After the patient was given bicarb and his rhythm remained stable for a few minutes, he was taken off bipap and I had a goals of care discussion with him (though he was difficult to understand at times). He noted that he has 2 children though 1 (and possibly both) is deceased. He has a sister who is his closest relative. She lives in [place]. He provided [place] with her phone number.   I briefly discussed goals of care with the patient when he was his most lucid. He stated that he would want to be intubated and that he would want CPR should either be necessary. I explained to him the severity of his condition. We will continue treatment with all available therapies and will reach out to his sister to update her if he would like [place] to do so.   Marcie Mowers, MD Cardiology Fellow, PGY-7

## 2019-08-29 NOTE — CV Procedure (Signed)
Central Venous Catheter Insertion Procedure Note [name] [name] [id number] 06-Oct-1934    Procedure: Insertion of Central Venous Catheter Indications: Drug and/or fluid administration   Procedure Details Consent: Risks of procedure as well as the alternatives and risks of each were explained to the (patient/caregiver).  Consent for procedure obtained. Time Out: Verified patient identification, verified procedure, site/side was marked, verified correct patient position, special equipment/implants available, medications/allergies/relevent history reviewed, required imaging and test results available.  Performed   Maximum sterile technique was used including antiseptics, cap, gloves, gown, hand hygiene, mask and sheet. Skin prep: Chlorhexidine; local anesthetic administered A antimicrobial bonded/coated triple lumen catheter was placed in the left internal jugular vein using the Seldinger technique and u/s guidance.   Evaluation Blood flow good Complications: No apparent complications Patient did tolerate procedure well. Chest X-ray ordered to verify placement.  CXR: ok   Glori Bickers, MD  1:33 AM

## 2019-08-29 NOTE — Progress Notes (Addendum)
Advanced Heart Failure Rounding Note   Subjective:    Remains on amio gtt, NE 15 and VP 0.03. On bicarb drip at 100. Now on Bipap.   Converted to NSR this morning around 615.   Hemodynamically more stable SBP 130s. Urine output now picking up. Putting out 300/hr.   Weak. But denies SOB, orthopnea or PND.   On heparin. No bleeding   Objective:   Weight Range:  Vital Signs:   Temp:  [89.2 F (31.8 C)-99.5 F (37.5 C)] 99.5 F (37.5 C) (12/16 0715) Pulse Rate:  [60-208] 61 (12/16 0715) Resp:  [16-42] 16 (12/16 0715) BP: (40-170)/(32-145) 168/65 (12/16 0715) SpO2:  [69 %-100 %] 83 % (12/16 0715) Arterial Line BP: (74-167)/(31-86) 152/53 (12/16 0715) Weight:  [74.4 kg-77.1 kg] 74.4 kg (12/16 0450) Last BM Date: (PTA)  Weight change: Filed Weights   08/29/19 0450  Weight: 74.4 kg    Intake/Output:   Intake/Output Summary (Last 24 hours) at 08/29/2019 0744 Last data filed at 08/29/2019 0700 Gross per 24 hour  Intake 975.19 ml  Output 390 ml  Net 585.19 ml     Physical Exam: General:  Critically-ill appearing. On Bipap  HEENT: normal Neck: supple. JVP 5 . Carotids 2+ bilat; no bruits. No lymphadenopathy or thryomegaly appreciated. Cor: PMI nondisplaced. irregular rate & rhythm. No rubs, gallops or murmurs. Lungs: clear Abdomen: soft, nontender, nondistended. No hepatosplenomegaly. No bruits or masses. Good bowel sounds. Extremities: no cyanosis, clubbing, rash, edema Neuro: alert & orientedx3, cranial nerves grossly intact. moves all 4 extremities w/o difficulty. Affect pleasant  Telemetry: Sinus rhythm 60s + PACs Personally reviewed   Labs: Basic Metabolic Panel: Recent Labs  Lab 08/28/19 1919 08/29/19 0059 08/29/19 0109 08/29/19 0245 08/29/19 0413 08/29/19 0451  NA 140 140 142 141 141  --   K 5.2* 5.1 4.6 4.9 4.9  --   CL 111 109  --   --   --   --   CO2 11* 13*  --   --   --   --   GLUCOSE 143* 217*  --   --   --   --   BUN 33* 35*  --   --    --   --   CREATININE 3.32* 3.42*  --   --   --   --   CALCIUM 8.2* 8.6*  --   --   --   --   MG 2.1  --   --   --   --  1.8  PHOS 6.3*  --   --   --   --  7.0*    Liver Function Tests: Recent Labs  Lab 08/28/19 1919 08/29/19 0059  AST 75* 77*  ALT 51* 63*  ALKPHOS 85 83  BILITOT 1.1 0.9  PROT 6.1* 6.1*  ALBUMIN 3.4* 3.3*   No results for input(s): LIPASE, AMYLASE in the last 168 hours. No results for input(s): AMMONIA in the last 168 hours.  CBC: Recent Labs  Lab 08/28/19 1919 08/29/19 0059 08/29/19 0109 08/29/19 0245 08/29/19 0413  WBC 10.9* 13.9*  --   --   --   NEUTROABS 8.4*  --   --   --   --   HGB 10.6* 10.6* 11.2* 10.2* 10.5*  HCT 31.7* 32.9* 33.0* 30.0* 31.0*  MCV 85.7 90.9  --   --   --   PLT 184 179  --   --   --     Cardiac  Enzymes: No results for input(s): CKTOTAL, CKMB, CKMBINDEX, TROPONINI in the last 168 hours.  BNP: BNP (last 3 results) Recent Labs    08/28/19 1919  BNP 1,337.0*    ProBNP (last 3 results) No results for input(s): PROBNP in the last 8760 hours.    Other results:  Imaging: CT Head Wo Contrast  Result Date: 08/28/2019 CLINICAL DATA:  Altered mental status, increasing shortness of breath, EXAM: CT HEAD WITHOUT CONTRAST TECHNIQUE: Contiguous axial images were obtained from the base of the skull through the vertex without intravenous contrast. COMPARISON:  CT head 12/16/2008 FINDINGS: Brain: Ovoid region of hypoattenuation in the left thalamus has an appearance most consistent with a remote lacunar infarct. There is dense mineralization of the globus pallidus compatible with senescent change. No evidence of acute infarction, hemorrhage, hydrocephalus, extra-axial collection or mass lesion/mass effect. Symmetric prominence of the ventricles, cisterns and sulci compatible with advanced parenchymal volume loss. Confluent areas of white matter hypoattenuation are most compatible with chronic microvascular angiopathy. Vascular:  Atherosclerotic calcification of the carotid siphons. No hyperdense vessel. Skull: No calvarial fracture or suspicious osseous lesion. No scalp swelling or hematoma. Sinuses/Orbits: Paranasal sinuses and mastoid air cells are predominantly clear. Included orbital structures are unremarkable. Other: None IMPRESSION: 1. 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 2. No acute intracranial abnormality. 3. Advanced parenchymal volume loss and chronic microvascular angiopathy. Electronically Signed   By: Kreg Shropshire M.D.   On: 08/28/2019 21:35   DG CHEST PORT 1 VIEW  Result Date: 08/29/2019 CLINICAL DATA:  PICC placement. EXAM: PORTABLE CHEST 1 VIEW COMPARISON:  Chest radiograph yesterday. FINDINGS: Left internal jugular central venous catheter tip projects over the mid SVC. No pneumothorax. Low lung volumes accentuating cardiac size. Tortuosity and atherosclerosis of the thoracic aorta. Stable blunting of costophrenic angles. No pulmonary edema. There is biapical pleuroparenchymal scarring. IMPRESSION: 1. Left internal jugular central venous catheter tip projects over the mid SVC. No pneumothorax. 2. Lower lung volumes from prior exam. Stable blunting of costophrenic angles. 3.  Aortic Atherosclerosis (ICD10-I70.0). Electronically Signed   By: Narda Rutherford M.D.   On: 08/29/2019 01:13   DG Chest Portable 1 View  Result Date: 08/28/2019 CLINICAL DATA:  Shortness of breath EXAM: PORTABLE CHEST 1 VIEW COMPARISON:  01/08/2009 FINDINGS: The heart size is enlarged. There is no pneumothorax. There are likely small bilateral pleural effusions, left greater than right. Bibasilar airspace opacities are noted and are favored to represent atelectasis. There is pleuroparenchymal scarring at the lung apices. Aortic calcifications are noted. IMPRESSION: 1. No acute cardiopulmonary process. 2. Cardiomegaly without overt edema. 3. Bibasilar atelectasis and possible trace bilateral pleural effusions. Electronically Signed    By: Katherine Mantle M.D.   On: 08/28/2019 19:39      Medications:     Scheduled Medications: . aspirin  81 mg Oral Daily  . chlorhexidine  15 mL Mouth Rinse BID  . Chlorhexidine Gluconate Cloth  6 each Topical Daily  . furosemide  80 mg Intravenous BID  . insulin aspart  0-9 Units Subcutaneous TID WC  . mouth rinse  15 mL Mouth Rinse q12n4p  . pravastatin  40 mg Oral q1800  . sodium chloride flush  3 mL Intravenous Q12H  . vancomycin variable dose per unstable renal function (pharmacist dosing)   Does not apply See admin instructions     Infusions: . sodium chloride    . amiodarone 30 mg/hr (08/29/19 0125)  . amiodarone    . ceFEPime (MAXIPIME) IV    .  epinephrine Stopped (08/29/19 0450)  . heparin 1,050 Units/hr (08/29/19 0700)  . norepinephrine (LEVOPHED) Adult infusion 15 mcg/min (08/29/19 0700)  . sodium bicarbonate 150 mEq in dextrose 5% 1000 mL 100 mL/hr at 08/29/19 0700  . vasopressin (PITRESSIN) infusion - *FOR SHOCK* 0.03 Units/min (08/29/19 0700)     PRN Medications:  sodium chloride, acetaminophen, ondansetron (ZOFRAN) IV, sodium chloride flush   Assessment/Plan:    1. Acute systolic HF with severe biventricular dysfunction  -> cardiogenic shock - suspect tachy-induced due to AFL/atrial tach - Bedside echo 12/16 EF hard to assess due to tachycardia but likely 25% with severe RV dysfunction. Repeat echo today - Volume status now improved. Can hold lasixstable - Support with NE and VP. Wean VP as tolerated - Recheck lactate, co-ox and ABG this am - Follow CVP and co-ox - Continue amio for atrial tach - Doubt sepsis but will continue Abx for 48 hour until cx result (has [facility] cx at [facility])  2. Atrial tach/AFL with RVR and post-termination pauses - has h/o chronic AF but I doubt this is his AF suspect atrial tach/AVNRT - failed DC-CV in ER on 12/15 - now in NSR on IV amio  - heparin - Will ask EP to see. Consider RFA when more stable - (only on  ASA/plavix at home) will need full AC  3. CAD - details unclear.  - currently stable.  - troponins ok despite marked hemodynamic stress - ASA/statin   4. AKI on CKD3 - baseline creatinine 1.3-1.8 - Creatinine now 3.3 but making urine  - due to ATN from shock - supportive care - hopefully can avoid CVVHD  5. DM2 - cover with SSI - check Hgba1c  6. Smell of kerosene - carboxyhgb ok   7. Acute hypoxic respiratory failure - improved on bipap  CRITICAL CARE Performed by: Glori Bickers  Total critical care time: 35 minutes  Critical care time was exclusive of separately billable procedures and treating other patients.  Critical care was necessary to treat or prevent imminent or life-threatening deterioration.  Critical care was time spent personally by me (independent of midlevel providers or residents) on the following activities: development of treatment plan with patient and/or surrogate as well as nursing, discussions with consultants, evaluation of patient's response to treatment, examination of patient, obtaining history from patient or surrogate, ordering and performing treatments and interventions, ordering and review of laboratory studies, ordering and review of radiographic studies, pulse oximetry and re-evaluation of patient's condition.      Length of Stay: 1   Glori Bickers MD 08/29/2019, 7:44 AM  Advanced Heart Failure Team Pager [phone] (M-F; [place])  Please contact [facility] for night-coverage after hours (4p -7a ) and weekends on [email or web address]

## 2019-08-30 DIAGNOSIS — E440 Moderate protein-calorie malnutrition: Secondary | ICD-10-CM | POA: Insufficient documentation

## 2019-08-30 LAB — BASIC METABOLIC PANEL
Anion gap: 11 (ref 5–15)
BUN: 35 mg/dL — ABNORMAL HIGH (ref 8–23)
CO2: 29 mmol/L (ref 22–32)
Calcium: 7.6 mg/dL — ABNORMAL LOW (ref 8.9–10.3)
Chloride: 103 mmol/L (ref 98–111)
Creatinine, Ser: 2.26 mg/dL — ABNORMAL HIGH (ref 0.61–1.24)
GFR calc Af Amer: 30 mL/min — ABNORMAL LOW (ref >60)
GFR calc non Af Amer: 26 mL/min — ABNORMAL LOW (ref >60)
Glucose, Bld: 130 mg/dL — ABNORMAL HIGH (ref 70–99)
Potassium: 3 mmol/L — ABNORMAL LOW (ref 3.5–5.1)
Sodium: 143 mmol/L (ref 135–145)

## 2019-08-30 LAB — POCT I-STAT 7, (LYTES, BLD GAS, ICA,H+H)
Acid-Base Excess: 7 mmol/L — ABNORMAL HIGH (ref 0.0–2.0)
Bicarbonate: 30.9 mmol/L — ABNORMAL HIGH (ref 20.0–28.0)
Calcium, Ion: 1.13 mmol/L — ABNORMAL LOW (ref 1.15–1.40)
HCT: 29 % — ABNORMAL LOW (ref 39.0–52.0)
Hemoglobin: 9.9 g/dL — ABNORMAL LOW (ref 13.0–17.0)
O2 Saturation: 95 %
Patient temperature: 98.8
Potassium: 3.1 mmol/L — ABNORMAL LOW (ref 3.5–5.1)
Sodium: 139 mmol/L (ref 135–145)
TCO2: 32 mmol/L (ref 22–32)
pCO2 arterial: 40.9 mmHg (ref 32.0–48.0)
pH, Arterial: 7.486 — ABNORMAL HIGH (ref 7.350–7.450)
pO2, Arterial: 72 mmHg — ABNORMAL LOW (ref 83.0–108.0)

## 2019-08-30 LAB — GLUCOSE, CAPILLARY
Glucose-Capillary: 115 mg/dL — ABNORMAL HIGH (ref 70–99)
Glucose-Capillary: 174 mg/dL — ABNORMAL HIGH (ref 70–99)
Glucose-Capillary: 109 mg/dL — ABNORMAL HIGH (ref 70–99)
Glucose-Capillary: 117 mg/dL — ABNORMAL HIGH (ref 70–99)

## 2019-08-30 LAB — CBC
HCT: 29.8 % — ABNORMAL LOW (ref 39.0–52.0)
Hemoglobin: 10.2 g/dL — ABNORMAL LOW (ref 13.0–17.0)
MCH: 29.7 pg (ref 26.0–34.0)
MCHC: 34.2 g/dL (ref 30.0–36.0)
MCV: 86.6 fL (ref 80.0–100.0)
Platelets: 169 10*3/uL (ref 150–400)
RBC: 3.44 MIL/uL — ABNORMAL LOW (ref 4.22–5.81)
RDW: 16.1 % — ABNORMAL HIGH (ref 11.5–15.5)
WBC: 13.9 10*3/uL — ABNORMAL HIGH (ref 4.0–10.5)
nRBC: 0.2 % (ref 0.0–0.2)

## 2019-08-30 LAB — COOXEMETRY PANEL
Carboxyhemoglobin: 1.2 % (ref 0.5–1.5)
Carboxyhemoglobin: 1.2 % (ref 0.5–1.5)
Methemoglobin: 1 % (ref 0.0–1.5)
Methemoglobin: 0.9 % (ref 0.0–1.5)
O2 Saturation: 81.9 %
O2 Saturation: 77.9 %
Total hemoglobin: 10.4 g/dL — ABNORMAL LOW (ref 12.0–16.0)
Total hemoglobin: 10.5 g/dL — ABNORMAL LOW (ref 12.0–16.0)

## 2019-08-30 LAB — LACTIC ACID, PLASMA: Lactic Acid, Venous: 1.2 mmol/L (ref 0.5–1.9)

## 2019-08-30 LAB — HEPARIN LEVEL (UNFRACTIONATED)
Heparin Unfractionated: 0.49 IU/mL (ref 0.30–0.70)
Heparin Unfractionated: 0.55 IU/mL (ref 0.30–0.70)

## 2019-08-30 LAB — MRSA PCR SCREENING: MRSA by PCR: NEGATIVE

## 2019-08-30 MED ORDER — POTASSIUM CHLORIDE 10 MEQ/50ML IV SOLN
10.0000 meq | INTRAVENOUS | Status: AC
  Administered 2019-08-30 (×4): 10 meq via INTRAVENOUS
  Filled 2019-08-30 (×5): qty 50

## 2019-08-30 MED ORDER — ENSURE ENLIVE PO LIQD
237.0000 mL | Freq: Three times a day (TID) | ORAL | Status: DC
  Administered 2019-08-30 – 2019-09-06 (×13): 237 mL via ORAL

## 2019-08-30 MED ORDER — POTASSIUM CHLORIDE CRYS ER 10 MEQ PO TBCR
40.0000 meq | EXTENDED_RELEASE_TABLET | Freq: Once | ORAL | Status: AC
  Administered 2019-08-30: 40 meq via ORAL
  Filled 2019-08-30: qty 4

## 2019-08-30 MED ORDER — FUROSEMIDE 10 MG/ML IJ SOLN
80.0000 mg | Freq: Once | INTRAMUSCULAR | Status: AC
  Administered 2019-08-30: 80 mg via INTRAVENOUS
  Filled 2019-08-30: qty 8

## 2019-08-30 MED ORDER — ADULT MULTIVITAMIN W/MINERALS CH
1.0000 | ORAL_TABLET | Freq: Every day | ORAL | Status: DC
  Administered 2019-08-30 – 2019-09-06 (×8): 1 via ORAL
  Filled 2019-08-30 (×7): qty 1

## 2019-08-30 NOTE — Progress Notes (Signed)
Paged on-call cards MD about K+3.0 this AM. Pt stable w/ no new changes.  Still waiting for response from MD.

## 2019-08-30 NOTE — Progress Notes (Signed)
   Pt remains critically ill.   Maintaining NSR at this time on IV amiodarone.   Dr. Curt Bears and Dr. Haroldine Laws discussed personally this am. We will continue to follow and assess rhythm to consider ablation prior to discharge pending course. To discuss with Dr. Lovena Le as well.   Legrand Como [address] [place], PA-C  08/30/2019 9:24 AM

## 2019-08-30 NOTE — Evaluation (Signed)
Clinical/Bedside Swallow Evaluation Patient Details  Name: [name] [name] MRN: [id number] Date of Birth: 05/31/1935  Today's Date: 08/30/2019 Time: SLP Start Time (ACUTE ONLY): 0855 SLP Stop Time (ACUTE ONLY): 0909 SLP Time Calculation (min) (ACUTE ONLY): 14 min  Past Medical History:  Past Medical History:  Diagnosis Date  . Arthritis    shoulder  . Asthma   . Atrial fibrillation (HCC)   . Chronic kidney disease    stage III  . COPD (chronic obstructive pulmonary disease) (HCC)   . Coronary artery disease   . Diabetes mellitus without complication (HCC)    type 2  . Dysrhythmia    PER [facility] HEART IRREGULARLY IRREGULAR  . Full dentures    upper and lower  . Hypercholesteremia   . Hypertension   . Myocardial infarction (HCC)   . Shortness of breath dyspnea   . Swelling    FEET AND LEGS   Past Surgical History:  Past Surgical History:  Procedure Laterality Date  . CARDIAC CATHETERIZATION    . CATARACT EXTRACTION W/PHACO Right 01/28/2016   Procedure: CATARACT EXTRACTION PHACO AND INTRAOCULAR LENS PLACEMENT (IOC) right eye;  Surgeon: Lockie Mola, MD;  Location: [facility];  Service: Ophthalmology;  Laterality: Right;  DIABETIC-oral med MALYUGIN  . RETINAL DETACHMENT SURGERY     HPI:  84 y.o. male with a history of DM2, HTN, HL, COPD, CAD, Chronic AF, and CKD 3 who was transferred from [facility] for further management of cardiogenic shock in setting AFL/tach with RVR. On BiPAP 12/16, improved on 2 liters [place] by 12/17.  Per RN, pt lethargic and had difficulty swallowing meds 12/16, hence swallow evaluation ordered.    Assessment / Plan / Recommendation Clinical Impression  Pt presents with normal oropharyngeal swallow: he is alert and communicative.  Oral mechanism exam is unremarkable.  Pt demonstrates normal mastication, brisk swallow response, and no s/s of aspiration.  After eating/drinking, he did report a sore throat - this did not impact  swallow function.  Resume a regular consistency diet,thin liquids; meds with water.  No SLP f/u is needed.  SLP Visit Diagnosis: Dysphagia, unspecified (R13.10)    Aspiration Risk  No limitations    Diet Recommendation     Medication Administration: Whole meds with liquid    Other  Recommendations Oral Care Recommendations: Oral care BID   Follow up Recommendations None      Frequency and Duration   n/a                 Swallow Study   General Date of Onset: 08/29/19 HPI: 84 y.o. male with a history of DM2, HTN, HL, COPD, CAD, Chronic AF, and CKD 3 who was transferred from [facility] for further management of cardiogenic shock in setting AFL/tach with RVR. On BiPAP 12/16, improved on 2 liters [place] by 12/17.  Per RN, pt had difficulty swallowing meds 12/16, hence swallow evaluation ordered.  Type of Study: Bedside Swallow Evaluation Previous Swallow Assessment: no Diet Prior to this Study: NPO Temperature Spikes Noted: No Respiratory Status: Nasal cannula History of Recent Intubation: No Behavior/Cognition: Alert;Cooperative;Pleasant mood Oral Cavity Assessment: Within Functional Limits Oral Care Completed by SLP: No Oral Cavity - Dentition: Adequate natural dentition Vision: Functional for self-feeding Self-Feeding Abilities: Able to feed self Patient Positioning: Upright in bed Baseline Vocal Quality: Normal Volitional Cough: Strong Volitional Swallow: Able to elicit    Oral/Motor/Sensory Function Overall Oral Motor/Sensory Function: Within functional limits   Ice Chips Ice chips: Within functional  limits   Thin Liquid Thin Liquid: Within functional limits    Nectar Thick Nectar Thick Liquid: Not tested   Honey Thick Honey Thick Liquid: Not tested   Puree Puree: Within functional limits   Solid     Solid: Within functional limits     Montrelle Eddings L. Tivis Ringer, [place] Office number [phone] Pager [phone]  Juan Quam  [place] 08/30/2019,9:13 AM

## 2019-08-30 NOTE — Progress Notes (Signed)
Notified Cards on call of K of 3.0 this AM.

## 2019-08-30 NOTE — Progress Notes (Addendum)
Advanced Heart Failure Rounding Note   Subjective:    Remains on amio gtt 30 mg/hr.  Maintaining NSR 60s.   Off pressors. NE and VP d/ced. MAPs in the 90s. Co-ox 82%. Remains on bicarb gtt. Lactic acid trending down, 1.3 yesterday.   Good UOP -3L out yesterday. Wt improved, down from 164>>159. SCr improving, 3.45>>3.16>>2.26.  K 3.0  CVP 8-9.  Feels better. Much more alert today. Breathing improved. Off BiPAP and on [place].    Objective:   Weight Range:  Vital Signs:   Temp:  [98.6 F (37 C)-99.7 F (37.6 C)] 98.6 F (37 C) (12/17 1030) Pulse Rate:  [57-69] 67 (12/17 1030) Resp:  [12-21] 21 (12/17 1030) BP: (94-156)/(55-91) 134/62 (12/17 1000) SpO2:  [95 %-100 %] 100 % (12/17 1030) Arterial Line BP: (74-150)/(43-75) 150/63 (12/17 1030) Weight:  [72.5 kg] 72.5 kg (12/17 0500) Last BM Date: 08/29/19  Weight change: Filed Weights   08/29/19 0450 08/30/19 0500  Weight: 74.4 kg 72.5 kg    Intake/Output:   Intake/Output Summary (Last 24 hours) at 08/30/2019 1043 Last data filed at 08/30/2019 1030 Gross per 24 hour  Intake 2245.24 ml  Output 2766 ml  Net -520.76 ml     Physical Exam: CVP 8-9 General:  Elderly thin AAM. No distress.  HEENT: normal Neck: supple. elevated JVP to ear . Carotids 2+ bilat; no bruits. No lymphadenopathy or thryomegaly appreciated. Cor: PMI nondisplaced. irregular rate & rhythm. No rubs, gallops or murmurs. Lungs: clear Abdomen: soft, nontender, nondistended. No hepatosplenomegaly. No bruits or masses. Good bowel sounds. Extremities: no cyanosis, clubbing, rash, edema Neuro: alert & orientedx3, cranial nerves grossly intact. moves all 4 extremities w/o difficulty. Affect pleasant  Telemetry: NSR 60,  Occasional PACs Personally reviewed   Labs: Basic Metabolic Panel: Recent Labs  Lab 08/28/19 1919 08/29/19 0059 08/29/19 0413 08/29/19 0451 08/29/19 0802 08/29/19 0804 08/29/19 1106 08/30/19 0341  NA 140 140 141  --  144 144  143 143  K 5.2* 5.1 4.9  --  3.9 4.0 3.7 3.0*  CL 111 109  --   --   --  106 105 103  CO2 11* 13*  --   --   --  20* 20* 29  GLUCOSE 143* 217*  --   --   --  267* 232* 130*  BUN 33* 35*  --   --   --  38* 39* 35*  CREATININE 3.32* 3.42*  --   --   --  3.45* 3.16* 2.26*  CALCIUM 8.2* 8.6*  --   --   --  8.5* 8.4* 7.6*  MG 2.1  --   --  1.8  --   --   --   --   PHOS 6.3*  --   --  7.0*  --   --   --   --     Liver Function Tests: Recent Labs  Lab 08/28/19 1919 08/29/19 0059  AST 75* 77*  ALT 51* 63*  ALKPHOS 85 83  BILITOT 1.1 0.9  PROT 6.1* 6.1*  ALBUMIN 3.4* 3.3*   No results for input(s): LIPASE, AMYLASE in the last 168 hours. No results for input(s): AMMONIA in the last 168 hours.  CBC: Recent Labs  Lab 08/28/19 1919 08/29/19 0059 08/29/19 0109 08/29/19 0245 08/29/19 0413 08/29/19 0802 08/30/19 0341  WBC 10.9* 13.9*  --   --   --   --  13.9*  NEUTROABS 8.4*  --   --   --   --   --   --  HGB 10.6* 10.6* 11.2* 10.2* 10.5* 11.2* 10.2*  HCT 31.7* 32.9* 33.0* 30.0* 31.0* 33.0* 29.8*  MCV 85.7 90.9  --   --   --   --  86.6  PLT 184 179  --   --   --   --  169    Cardiac Enzymes: No results for input(s): CKTOTAL, CKMB, CKMBINDEX, TROPONINI in the last 168 hours.  BNP: BNP (last 3 results) Recent Labs    08/28/19 1919  BNP 1,337.0*    ProBNP (last 3 results) No results for input(s): PROBNP in the last 8760 hours.    Other results:  Imaging: CT Head Wo Contrast  Result Date: 08/28/2019 CLINICAL DATA:  Altered mental status, increasing shortness of breath, EXAM: CT HEAD WITHOUT CONTRAST TECHNIQUE: Contiguous axial images were obtained from the base of the skull through the vertex without intravenous contrast. COMPARISON:  CT head 12/16/2008 FINDINGS: Brain: Ovoid region of hypoattenuation in the left thalamus has an appearance most consistent with a remote lacunar infarct. There is dense mineralization of the globus pallidus compatible with senescent  change. No evidence of acute infarction, hemorrhage, hydrocephalus, extra-axial collection or mass lesion/mass effect. Symmetric prominence of the ventricles, cisterns and sulci compatible with advanced parenchymal volume loss. Confluent areas of white matter hypoattenuation are most compatible with chronic microvascular angiopathy. Vascular: Atherosclerotic calcification of the carotid siphons. No hyperdense vessel. Skull: No calvarial fracture or suspicious osseous lesion. No scalp swelling or hematoma. Sinuses/Orbits: Paranasal sinuses and mastoid air cells are predominantly clear. Included orbital structures are unremarkable. Other: None IMPRESSION: 1. 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 2. No acute intracranial abnormality. 3. Advanced parenchymal volume loss and chronic microvascular angiopathy. Electronically Signed   By: Lovena Le M.D.   On: 08/28/2019 21:35   DG CHEST PORT 1 VIEW  Result Date: 08/29/2019 CLINICAL DATA:  PICC placement. EXAM: PORTABLE CHEST 1 VIEW COMPARISON:  Chest radiograph yesterday. FINDINGS: Left internal jugular central venous catheter tip projects over the mid SVC. No pneumothorax. Low lung volumes accentuating cardiac size. Tortuosity and atherosclerosis of the thoracic aorta. Stable blunting of costophrenic angles. No pulmonary edema. There is biapical pleuroparenchymal scarring. IMPRESSION: 1. Left internal jugular central venous catheter tip projects over the mid SVC. No pneumothorax. 2. Lower lung volumes from prior exam. Stable blunting of costophrenic angles. 3.  Aortic Atherosclerosis (ICD10-I70.0). Electronically Signed   By: Keith Rake M.D.   On: 08/29/2019 01:13   DG Chest Portable 1 View  Result Date: 08/28/2019 CLINICAL DATA:  Shortness of breath EXAM: PORTABLE CHEST 1 VIEW COMPARISON:  01/08/2009 FINDINGS: The heart size is enlarged. There is no pneumothorax. There are likely small bilateral pleural effusions, left greater than right.  Bibasilar airspace opacities are noted and are favored to represent atelectasis. There is pleuroparenchymal scarring at the lung apices. Aortic calcifications are noted. IMPRESSION: 1. No acute cardiopulmonary process. 2. Cardiomegaly without overt edema. 3. Bibasilar atelectasis and possible trace bilateral pleural effusions. Electronically Signed   By: Constance Holster M.D.   On: 08/28/2019 19:39   ECHOCARDIOGRAM COMPLETE  Result Date: 08/29/2019   ECHOCARDIOGRAM REPORT   Patient Name:   JAQUAE RIEVES Date of Exam: 08/29/2019 Medical Rec #:  [id number]         Height:       71.0 in Accession #:    [id number]        Weight:       164.0 lb Date of Birth:  October 09, 1934  BSA:          1.94 m Patient Age:    84 years          BP:           155/55 mmHg Patient Gender: M                 HR:           70 bpm. Exam Location:  Inpatient Procedure: 2D Echo Indications:    CHF-Acute Systolic 428.21 / I50.21  History:        Patient has no prior history of Echocardiogram examinations.                 CAD, COPD, Arrythmias:Atrial Flutter and Atrial Fibrillation;                 Risk Factors:Former Smoker. Cardiogenic Shock,.  Sonographer:    Jeryl ColumbiaJohanna Elliott Referring Phys: [id number] Karessa Onorato R Krystan Northrop IMPRESSIONS  1. Severely reduced LVEF (25-30%) with global HK. LVOT VTI 10 cm which equates to CO/CI of 2.0 L/min and 1.0 L/min/m2.  2. Left ventricular ejection fraction, by visual estimation, is 25 to 30%. The left ventricle has severely decreased function. There is borderline left ventricular hypertrophy.  3. Left ventricular diastolic parameters are consistent with Grade II diastolic dysfunction (pseudonormalization).  4. The left ventricle demonstrates global hypokinesis.  5. Global right ventricle has severely reduced systolic function.The right ventricular size is mildly enlarged. No increase in right ventricular wall thickness.  6. Left atrial size was normal.  7. Right atrial size was mild-moderately dilated.   8. Presence of pericardial fat pad.  9. The pericardial effusion is circumferential. 10. Trivial pericardial effusion is present. 11. The mitral valve is degenerative. Mild mitral valve regurgitation. 12. The tricuspid valve is grossly normal. Tricuspid valve regurgitation moderate-severe. 13. The aortic valve is tricuspid. Aortic valve regurgitation is trivial. Mild to moderate aortic valve sclerosis/calcification without any evidence of aortic stenosis. 14. The pulmonic valve was grossly normal. Pulmonic valve regurgitation is not visualized. 15. Normal pulmonary artery systolic pressure. 16. The inferior vena cava is dilated in size with <50% respiratory variability, suggesting right atrial pressure of 15 mmHg. 17. The tricuspid regurgitant velocity is 1.89 m/s, and with an assumed right atrial pressure of 15 mmHg, the estimated right ventricular systolic pressure is normal at 29.3 mmHg. 18. Elevated left atrial pressure. FINDINGS  Left Ventricle: Left ventricular ejection fraction, by visual estimation, is 25 to 30%. The left ventricle has severely decreased function. The left ventricle demonstrates global hypokinesis. The left ventricular internal cavity size was the left ventricle is normal in size. There is borderline left ventricular hypertrophy. Left ventricular diastolic parameters are consistent with Grade II diastolic dysfunction (pseudonormalization). Elevated left atrial pressure. Severely reduced LVEF (25-30%) with global HK. LVOT VTI 10 cm which equates to CO/CI of 2.0 L/min and 1.0 L/min/m2. Right Ventricle: The right ventricular size is mildly enlarged. No increase in right ventricular wall thickness. Global RV systolic function is has severely reduced systolic function. The tricuspid regurgitant velocity is 1.89 m/s, and with an assumed right atrial pressure of 15 mmHg, the estimated right ventricular systolic pressure is normal at 29.3 mmHg. Left Atrium: Left atrial size was normal in size. Right  Atrium: Right atrial size was mild-moderately dilated Pericardium: Trivial pericardial effusion is present. The pericardial effusion is circumferential. Presence of pericardial fat pad. Mitral Valve: The mitral valve is degenerative in appearance. Mild mitral valve regurgitation. Tricuspid Valve: The  tricuspid valve is grossly normal. Tricuspid valve regurgitation moderate-severe. Aortic Valve: The aortic valve is tricuspid. Aortic valve regurgitation is trivial. Aortic regurgitation PHT measures 816 msec. Mild to moderate aortic valve sclerosis/calcification is present, without any evidence of aortic stenosis. Pulmonic Valve: The pulmonic valve was grossly normal. Pulmonic valve regurgitation is not visualized. Pulmonic regurgitation is not visualized. Aorta: The aortic root is normal in size and structure. Venous: The inferior vena cava is dilated in size with less than 50% respiratory variability, suggesting right atrial pressure of 15 mmHg. IAS/Shunts: No atrial level shunt detected by color flow Doppler.  LEFT VENTRICLE PLAX 2D LVIDd:         3.50 cm  Diastology LVIDs:         2.70 cm  LV e' lateral:   8.16 cm/s LV PW:         1.80 cm  LV E/e' lateral: 11.0 LV IVS:        1.20 cm  LV e' medial:    5.98 cm/s LVOT diam:     2.00 cm  LV E/e' medial:  15.0 LV SV:         24 ml LV SV Index:   12.35 LVOT Area:     3.14 cm  RIGHT VENTRICLE RV S prime:     7.94 cm/s LEFT ATRIUM           Index       RIGHT ATRIUM           Index LA diam:      3.30 cm 1.70 cm/m  RA Area:     21.20 cm LA Vol (A2C): 59.4 ml 30.65 ml/m RA Volume:   58.10 ml  29.98 ml/m LA Vol (A4C): 48.3 ml 24.92 ml/m  AORTIC VALVE LVOT Vmax:   61.13 cm/s LVOT Vmean:  41.421 cm/s LVOT VTI:    0.097 m AI PHT:      816 msec  AORTA Ao Root diam: 3.40 cm MITRAL VALVE                        TRICUSPID VALVE MV Area (PHT): 3.12 cm             TR Peak grad:   14.3 mmHg MV PHT:        70.47 msec           TR Vmax:        189.00 cm/s MV Decel Time: 243 msec MV  E velocity: 89.50 cm/s 103 cm/s  SHUNTS MV A velocity: 66.00 cm/s 70.3 cm/s Systemic VTI:  0.10 m MV E/A ratio:  1.36       1.5       Systemic Diam: 2.00 cm  Lennie Odor MD Electronically signed by Lennie Odor MD Signature Date/Time: 08/29/2019/3:38:36 PM    Final      Medications:     Scheduled Medications: . aspirin  81 mg Oral Daily  . chlorhexidine  15 mL Mouth Rinse BID  . Chlorhexidine Gluconate Cloth  6 each Topical Daily  . insulin aspart  0-9 Units Subcutaneous TID WC  . mouth rinse  15 mL Mouth Rinse q12n4p  . pravastatin  40 mg Oral q1800  . sodium chloride flush  3 mL Intravenous Q12H    Infusions: . sodium chloride    . amiodarone 30 mg/hr (08/30/19 1000)  . heparin 800 Units/hr (08/30/19 1000)  . norepinephrine (LEVOPHED) Adult infusion Stopped (08/30/19 0655)  .  potassium chloride 10 mEq (08/30/19 1030)  . sodium bicarbonate 150 mEq in dextrose 5% 1000 mL 50 mL/hr at 08/30/19 1000    PRN Medications: sodium chloride, acetaminophen, ondansetron (ZOFRAN) IV, sodium chloride flush   Assessment/Plan:    1. Acute systolic HF with severe biventricular dysfunction  -> cardiogenic shock - suspect tachy-induced due to AFL/atrial tach - Bedside echo 12/16 EF hard to assess due to tachycardia but likely 25% with severe RV dysfunction. Repeat echo today - Volume status improving but CVP 8-9 w/ elevated JVD on exam. Given IV Lasix 80 mg x 1 - Co-ox 82 % - lactic acid improved (1.3 yesterday). CO2 improved at 29. Will d/c bicarb drip  - Continue amio for atrial tach   2. Atrial tach/AFL with RVR and post-termination pauses - has h/o chronic AF but I doubt this is his AF suspect atrial tach/AVNRT - failed DC-CV in ER on 12/15 - now in NSR on IV amio  - heparin - Consider RFA when more stable - (only on ASA/plavix at home) will need full AC  3. CAD - details unclear.  - currently stable.  - troponins ok despite marked hemodynamic stress - ASA/statin   4.  AKI on CKD3 - baseline creatinine 1.3-1.8 - Creatinine up to 3 this admit but improved, 2.26. Good UOP w/ IV Lasix. - due to ATN from shock - supportive care - hopefully can avoid CVVHD  5. DM2 - cover with SSI - hgb a1c 7.0  6. Smell of kerosene - carboxyhgb ok   7. Acute hypoxic respiratory failure - improved. Off bipap.  Stable w/ [place]  8. Hypokalemia: K 3.0 - aggressive supp. Received IV supp this am.  - Will give additional PO w/ IV Lasix   Length of Stay: 2   Brittainy Simmons PA-C 08/30/2019, 10:43 AM  Advanced Heart Failure Team Pager [phone] (M-F; 7a - 4p)  Please contact [facility] for night-coverage after hours (4p -7a ) and weekends on [email or web address]  Agree with above.   Remains on amio and bicarb gtt.  Inotropes weaned off overnght. Co-ox stable. Echo reviewed and shoes EF ~25% with severe biventricular failure.  Maintaining NSR on amio.   Renal function now improving. Lactic acidosis resolved.   General:  Frail/cachetic appearing  No resp difficulty HEENT: normal Neck: supple. JVP flat. Carotids 2+ bilat; no bruits. No lymphadenopathy or thryomegaly appreciated. Cor: PMI nondisplaced. Regular rate & rhythm. 2/6 MR Lungs: clear Abdomen: soft, nontender, nondistended. No hepatosplenomegaly. No bruits or masses. Good bowel sounds. Extremities: no cyanosis, clubbing, rash, edema Neuro: alert & orientedx3, cranial nerves grossly intact. moves all 4 extremities w/o difficulty. Affect pleasant  I have reviewed his tracings with EP and rhythm appears to be VT and not atrial tach. Fortunately it has been suppressed on amio. Suspect CM may not be tachy mediated but VT may be result of LV dysfunction and not cause of it.  Will thus need ischemic eval with cath if/when kidneys recover sufficiently. Continue IV amio. Keep K > 4.0 Mg > 2.0. Start HF meds as tolerated. If he recovers significantly will need LifeVest.  PT to see.   CRITICAL CARE Performed by:  Arvilla Meres  Total critical care time: 35 minutes  Critical care time was exclusive of separately billable procedures and treating other patients.  Critical care was necessary to treat or prevent imminent or life-threatening deterioration.  Critical care was time spent personally by me (independent of midlevel providers or residents) on the following  activities: development of treatment plan with patient and/or surrogate as well as nursing, discussions with consultants, evaluation of patient's response to treatment, examination of patient, obtaining history from patient or surrogate, ordering and performing treatments and interventions, ordering and review of laboratory studies, ordering and review of radiographic studies, pulse oximetry and re-evaluation of patient's condition.  Arvilla Meresaniel Torianne Laflam, MD  8:24 PM

## 2019-08-30 NOTE — Progress Notes (Addendum)
Initial Nutrition Assessment  DOCUMENTATION CODES:   Non-severe (moderate) malnutrition in context of chronic illness  INTERVENTION:    Ensure Enlive po TID, each supplement provides 350 kcal and 20 grams of protein  MVI daily   NUTRITION DIAGNOSIS:   Moderate Malnutrition related to chronic illness(CHF) as evidenced by moderate fat depletion, moderate muscle depletion, severe muscle depletion.  GOAL:   Patient will meet greater than or equal to 90% of their needs  MONITOR:   PO intake, Supplement acceptance, Weight trends, Labs, I & O's  REASON FOR ASSESSMENT:   Malnutrition Screening Tool    ASSESSMENT:   Patient with PMH significant for DM, HTN, HLD, CHF, CAD, chronic AF, and CKD III. Presents this admission with acute exacerbation CHF.   Pt lives alone and does all of his own cooking. Denies loss of appetite PTA. Typically eats two meals daily that consist of B- eggs with sausage D- butter beans or pork and beans. Does not use supplementation at home. May benefit from low sodium diet education once out of ICU. Diet advanced by SLP today. Discussed the importance of protein intake for preservation of lean body mass. Pt amendable to Ensure.   Pt endorses a UBW of 175 lb and a recent unintentional wt loss of 15 lb in the last month. Records indicate pt weighed 166 lb at [facility] on 04/24/18 and 159 lb this admission (lacks history over the last year).   I/O: -803 ml since admit UOP: 3,040 ml x 24 hrs   Drips: amiodarone, sodium bicarb in D5 @ 50 ml/hr  Medications: SS novolog, MVI with minerals, 40 mEq KCl once Labs: K 3.0 (L) CBG 109-174 Cr 2.26-trending down  NUTRITION - FOCUSED PHYSICAL EXAM:    Most Recent Value  Orbital Region  Mild depletion  Upper Arm Region  Moderate depletion  Thoracic and Lumbar Region  Moderate depletion  Buccal Region  Mild depletion  Temple Region  Severe depletion  Clavicle Bone Region  Moderate depletion  Clavicle and Acromion Bone  Region  Moderate depletion  Scapular Bone Region  Moderate depletion  Dorsal Hand  Mild depletion  Patellar Region  Severe depletion  Anterior Thigh Region  Severe depletion  Posterior Calf Region  Severe depletion  Edema (RD Assessment)  Moderate  Hair  Reviewed  Eyes  Reviewed  Mouth  Reviewed  Skin  Reviewed  Nails  Reviewed     Diet Order:   Diet Order            Diet Heart Room service appropriate? Yes with Assist; Fluid consistency: Thin  Diet effective now              EDUCATION NEEDS:   Education needs have been addressed  Skin:  Skin Assessment: Reviewed RN Assessment  Last BM:  12/16  Height:   Ht Readings from Last 1 Encounters:  08/29/19 5\' 11"  (1.803 m)    Weight:   Wt Readings from Last 1 Encounters:  08/30/19 72.5 kg    Ideal Body Weight:  78.2 kg  BMI:  Body mass index is 22.29 kg/m.  Estimated Nutritional Needs:   Kcal:  2200-2400 kcal  Protein:  110-130 grams  Fluid:  >/= 2 L/day   Mariana Single RD, LDN Clinical Nutrition Pager # - [phone]

## 2019-08-30 NOTE — Progress Notes (Signed)
ANTICOAGULATION CONSULT NOTE - Follow-Up Consult  Pharmacy Consult for Heparin Indication: atrial fibrillation  No Known Allergies  Patient Measurements: Height: 5\' 11"  (180.3 cm) Weight: 164 lb 0.4 oz (74.4 kg) IBW/kg (Calculated) : 75.3 Heparin Dosing Weight: 74kg  Vital Signs: Temp: 99.1 F (37.3 C) (12/17 0400) Temp Source: Bladder (12/17 0000) BP: 128/57 (12/17 0400) Pulse Rate: 58 (12/17 0400)  Labs: Recent Labs    08/28/19 1919 08/28/19 2126 08/29/19 0059 08/29/19 0413 08/29/19 0802 08/29/19 0804 08/29/19 1106 08/30/19 0341  HGB 10.6*  --  10.6* 10.5* 11.2*  --   --  10.2*  HCT 31.7*  --  32.9* 31.0* 33.0*  --   --  29.8*  PLT 184  --  179  --   --   --   --  169  LABPROT  --   --  19.0*  --   --   --   --   --   INR  --   --  1.6*  --   --   --   --   --   HEPARINUNFRC  --   --   --   --   --   --  1.20* 0.49  CREATININE 3.32*  --  3.42*  --   --  3.45* 3.16* 2.26*  TROPONINIHS 35* 40* 65*  --   --   --   --   --     Estimated Creatinine Clearance: 26.5 mL/min (A) (by C-G formula based on SCr of 2.26 mg/dL (H)).   Medical History: Past Medical History:  Diagnosis Date  . Arthritis    shoulder  . Asthma   . Atrial fibrillation ([place])   . Chronic kidney disease    stage III  . COPD (chronic obstructive pulmonary disease) ([place])   . Coronary artery disease   . Diabetes mellitus without complication ([place])    type 2  . Dysrhythmia    [facility] HEART IRREGULARLY IRREGULAR  . Full dentures    upper and lower  . Hypercholesteremia   . Hypertension   . Myocardial infarction ([place])   . Shortness of breath dyspnea   . Swelling    FEET AND LEGS     Assessment: 67 yoM admitted with cardiogenic shock and AFib s/p DCCV. Pt started on IV heparin, initial heparin level elevated at 1.2, CBC stable.  12/17 AM update: -Heparin level therapeutic x 1 after rate decrease and 2 hr hold -Did have some bleeding earlier yesterday evening-heparin held  x 2 hrs before re-start  Goal of Therapy:  Heparin level 0.3-0.7 units/ml Monitor platelets by anticoagulation protocol: Yes   Plan:  -Cont heparin at 800 units/hr -Confirmatory heparin level at 1200 -Monitor for any further bleeding  Narda Bonds, PharmD, BCPS Clinical Pharmacist Phone: [phone]

## 2019-08-30 NOTE — Progress Notes (Signed)
ANTICOAGULATION CONSULT NOTE - Follow-Up Consult  Pharmacy Consult for Heparin Indication: atrial fibrillation  No Known Allergies  Patient Measurements: Height: 5\' 11"  (180.3 cm) Weight: 159 lb 13.3 oz (72.5 kg) IBW/kg (Calculated) : 75.3 Heparin Dosing Weight: 74kg  Vital Signs: Temp: 99 F (37.2 C) (12/17 0800) Temp Source: Bladder (12/17 0800) BP: 156/63 (12/17 0800) Pulse Rate: 59 (12/17 0800)  Labs: Recent Labs    08/28/19 1919 08/28/19 2126 08/29/19 0059 08/29/19 0413 08/29/19 0802 08/29/19 0804 08/29/19 1106 08/30/19 0341  HGB 10.6*  --  10.6* 10.5* 11.2*  --   --  10.2*  HCT 31.7*  --  32.9* 31.0* 33.0*  --   --  29.8*  PLT 184  --  179  --   --   --   --  169  LABPROT  --   --  19.0*  --   --   --   --   --   INR  --   --  1.6*  --   --   --   --   --   HEPARINUNFRC  --   --   --   --   --   --  1.20* 0.49  CREATININE 3.32*  --  3.42*  --   --  3.45* 3.16* 2.26*  TROPONINIHS 35* 40* 65*  --   --   --   --   --     Estimated Creatinine Clearance: 25.8 mL/min (A) (by C-G formula based on SCr of 2.26 mg/dL (H)).   Medical History: Past Medical History:  Diagnosis Date  . Arthritis    shoulder  . Asthma   . Atrial fibrillation ([place])   . Chronic kidney disease    stage III  . COPD (chronic obstructive pulmonary disease) ([place])   . Coronary artery disease   . Diabetes mellitus without complication ([place])    type 2  . Dysrhythmia    [facility] HEART IRREGULARLY IRREGULAR  . Full dentures    upper and lower  . Hypercholesteremia   . Hypertension   . Myocardial infarction ([place])   . Shortness of breath dyspnea   . Swelling    FEET AND LEGS     Assessment: 19 yoM admitted with cardiogenic shock and AFib s/p DCCV.  12/17 update: -Heparin level therapeutic x 2, Hgb stable 10s. -No further bleeding noted, patient feels great today.   Goal of Therapy:  Heparin level 0.3-0.7 units/ml Monitor platelets by anticoagulation protocol:  Yes   Plan:  -Cont heparin at 800 units/hr -Will need long term oral AC at discharge -Monitor for any further bleeding  Erin Hearing PharmD., BCPS Clinical Pharmacist 08/30/2019 9:00 AM

## 2019-08-31 ENCOUNTER — Encounter (HOSPITAL_COMMUNITY): Payer: Self-pay | Admitting: Internal Medicine

## 2019-08-31 LAB — CBC
HCT: 29.5 % — ABNORMAL LOW (ref 39.0–52.0)
Hemoglobin: 9.9 g/dL — ABNORMAL LOW (ref 13.0–17.0)
MCH: 29.5 pg (ref 26.0–34.0)
MCHC: 33.6 g/dL (ref 30.0–36.0)
MCV: 87.8 fL (ref 80.0–100.0)
Platelets: 149 10*3/uL — ABNORMAL LOW (ref 150–400)
RBC: 3.36 MIL/uL — ABNORMAL LOW (ref 4.22–5.81)
RDW: 16.2 % — ABNORMAL HIGH (ref 11.5–15.5)
WBC: 8.4 10*3/uL (ref 4.0–10.5)
nRBC: 0 % (ref 0.0–0.2)

## 2019-08-31 LAB — COOXEMETRY PANEL
Carboxyhemoglobin: 1.2 % (ref 0.5–1.5)
Methemoglobin: 0.9 % (ref 0.0–1.5)
O2 Saturation: 61.2 %
Total hemoglobin: 14.5 g/dL (ref 12.0–16.0)

## 2019-08-31 LAB — BASIC METABOLIC PANEL
Anion gap: 9 (ref 5–15)
BUN: 24 mg/dL — ABNORMAL HIGH (ref 8–23)
CO2: 32 mmol/L (ref 22–32)
Calcium: 7.9 mg/dL — ABNORMAL LOW (ref 8.9–10.3)
Chloride: 100 mmol/L (ref 98–111)
Creatinine, Ser: 1.7 mg/dL — ABNORMAL HIGH (ref 0.61–1.24)
GFR calc Af Amer: 43 mL/min — ABNORMAL LOW (ref >60)
GFR calc non Af Amer: 37 mL/min — ABNORMAL LOW (ref >60)
Glucose, Bld: 104 mg/dL — ABNORMAL HIGH (ref 70–99)
Potassium: 3.4 mmol/L — ABNORMAL LOW (ref 3.5–5.1)
Sodium: 141 mmol/L (ref 135–145)

## 2019-08-31 LAB — LACTIC ACID, PLASMA: Lactic Acid, Venous: 0.8 mmol/L (ref 0.5–1.9)

## 2019-08-31 LAB — GLUCOSE, CAPILLARY
Glucose-Capillary: 92 mg/dL (ref 70–99)
Glucose-Capillary: 142 mg/dL — ABNORMAL HIGH (ref 70–99)
Glucose-Capillary: 133 mg/dL — ABNORMAL HIGH (ref 70–99)
Glucose-Capillary: 150 mg/dL — ABNORMAL HIGH (ref 70–99)

## 2019-08-31 LAB — HEPARIN LEVEL (UNFRACTIONATED): Heparin Unfractionated: 0.44 IU/mL (ref 0.30–0.70)

## 2019-08-31 MED ORDER — DIGOXIN 125 MCG PO TABS
0.1250 mg | ORAL_TABLET | Freq: Every day | ORAL | Status: DC
  Administered 2019-08-31 – 2019-09-04 (×5): 0.125 mg via ORAL
  Filled 2019-08-31 (×5): qty 1

## 2019-08-31 MED ORDER — POTASSIUM CHLORIDE CRYS ER 20 MEQ PO TBCR
40.0000 meq | EXTENDED_RELEASE_TABLET | Freq: Two times a day (BID) | ORAL | Status: AC
  Administered 2019-08-31 (×2): 40 meq via ORAL
  Filled 2019-08-31 (×3): qty 2

## 2019-08-31 MED ORDER — SPIRONOLACTONE 12.5 MG HALF TABLET
12.5000 mg | ORAL_TABLET | Freq: Every day | ORAL | Status: DC
  Administered 2019-08-31 – 2019-09-01 (×2): 12.5 mg via ORAL
  Filled 2019-08-31 (×2): qty 1

## 2019-08-31 NOTE — Progress Notes (Signed)
CARDIAC REHAB PHASE I   PRE:  Rate/Rhythm: 60 SR  BP:  Supine:   Sitting: 134/63  Standing:    SaO2: 98%RA  MODE:  Ambulation: 370 ft   POST:  Rate/Rhythm: 70 SR  BP:  Supine:   Sitting: 181/71  Standing:    SaO2: 96%RA 1315-1358 Pt walked 370 ft on RA with gait belt use, rolling walker and asst x 2. Gait steady. Tolerated well with heart rate at 70. To recliner with call bell after walk. No c/o CP or SOB.   Graylon Good, RN BSN  08/31/2019 1:50 PM

## 2019-08-31 NOTE — Progress Notes (Signed)
PT Cancellation Note  Patient Details Name: [name] [name] MRN: [id number] DOB: [birth year]   Cancelled Treatment:    Reason Eval/Treat Not Completed: Other (comment) Patient transferred to [facility] when I checked on him.   Will attempt again another day.   Reginia Naas 08/31/2019, 4:38 PM Magda Kiel, [place] [phone] 08/31/2019

## 2019-08-31 NOTE — Progress Notes (Addendum)
Advanced Heart Failure Rounding Note   Subjective:    EP reviewed rhythm on admit--> VT. Remains on amio gtt 30 mg/hr. Maintaining NSR today.   - Creatinine trending down 3.45>1.7   Denies SOB. Denies chest pain.     Objective:   Weight Range:  Vital Signs:   Temp:  [98.4 F (36.9 C)-99.3 F (37.4 C)] 98.4 F (36.9 C) (12/18 0336) Pulse Rate:  [57-71] 59 (12/18 0530) Resp:  [13-23] 15 (12/18 0530) BP: (115-175)/(56-88) 160/67 (12/18 0530) SpO2:  [96 %-100 %] 97 % (12/18 0530) Arterial Line BP: (101-152)/(32-68) 126/44 (12/18 0530) Weight:  [72.6 kg] 72.6 kg (12/18 0530) Last BM Date: 08/30/19  Weight change: Filed Weights   08/29/19 0450 08/30/19 0500 08/31/19 0530  Weight: 74.4 kg 72.5 kg 72.6 kg    Intake/Output:   Intake/Output Summary (Last 24 hours) at 08/31/2019 0803 Last data filed at 08/31/2019 0500 Gross per 24 hour  Intake 1326.97 ml  Output 2225 ml  Net -898.03 ml    CVP 2  Physical Exam: General: No resp difficulty HEENT: normal Neck: supple. JVP flat  Carotids 2+ bilat; no bruits. No lymphadenopathy or thryomegaly appreciated. LIJ central line.  Cor: PMI nondisplaced. Regular rate & rhythm. No rubs, gallops or murmurs. Lungs: clear Abdomen: soft, nontender, nondistended. No hepatosplenomegaly. No bruits or masses. Good bowel sounds. Extremities: no cyanosis, clubbing, rash, edema. RUE A line  Neuro: alert & orientedx3, cranial nerves grossly intact. moves all 4 extremities w/o difficulty. Affect pleasant  Telemetry: NSR 60s personally reviewed.  Labs: Basic Metabolic Panel: Recent Labs  Lab 08/28/19 1919 08/29/19 0059 08/29/19 0451 08/29/19 0804 08/29/19 1106 08/30/19 0341 08/30/19 2324 08/31/19 0311  NA 140 140  --  144 143 143 139 141  K 5.2* 5.1  --  4.0 3.7 3.0* 3.1* 3.4*  CL 111 109  --  106 105 103  --  100  CO2 11* 13*  --  20* 20* 29  --  32  GLUCOSE 143* 217*  --  267* 232* 130*  --  104*  BUN 33* 35*  --  38* 39*  35*  --  24*  CREATININE 3.32* 3.42*  --  3.45* 3.16* 2.26*  --  1.70*  CALCIUM 8.2* 8.6*  --  8.5* 8.4* 7.6*  --  7.9*  MG 2.1  --  1.8  --   --   --   --   --   PHOS 6.3*  --  7.0*  --   --   --   --   --     Liver Function Tests: Recent Labs  Lab 08/28/19 1919 08/29/19 0059  AST 75* 77*  ALT 51* 63*  ALKPHOS 85 83  BILITOT 1.1 0.9  PROT 6.1* 6.1*  ALBUMIN 3.4* 3.3*   No results for input(s): LIPASE, AMYLASE in the last 168 hours. No results for input(s): AMMONIA in the last 168 hours.  CBC: Recent Labs  Lab 08/28/19 1919 08/29/19 0059 08/29/19 0413 08/29/19 0802 08/30/19 0341 08/30/19 2324 08/31/19 0311  WBC 10.9* 13.9*  --   --  13.9*  --  8.4  NEUTROABS 8.4*  --   --   --   --   --   --   HGB 10.6* 10.6* 10.5* 11.2* 10.2* 9.9* 9.9*  HCT 31.7* 32.9* 31.0* 33.0* 29.8* 29.0* 29.5*  MCV 85.7 90.9  --   --  86.6  --  87.8  PLT 184 179  --   --  169  --  149*    Cardiac Enzymes: No results for input(s): CKTOTAL, CKMB, CKMBINDEX, TROPONINI in the last 168 hours.  BNP: BNP (last 3 results) Recent Labs    08/28/19 1919  BNP 1,337.0*    ProBNP (last 3 results) No results for input(s): PROBNP in the last 8760 hours.    Other results:  Imaging: ECHOCARDIOGRAM COMPLETE  Result Date: 08/29/2019   ECHOCARDIOGRAM REPORT   Patient Name:   Elza RafterWALTER L Dols Date of Exam: 08/29/2019 Medical Rec #:  [id number]         Height:       71.0 in Accession #:    [id number]        Weight:       164.0 lb Date of Birth:  01/28/1935          BSA:          1.94 m Patient Age:    84 years          BP:           155/55 mmHg Patient Gender: M                 HR:           70 bpm. Exam Location:  Inpatient Procedure: 2D Echo Indications:    CHF-Acute Systolic 428.21 / I50.21  History:        Patient has no prior history of Echocardiogram examinations.                 CAD, COPD, Arrythmias:Atrial Flutter and Atrial Fibrillation;                 Risk Factors:Former Smoker. Cardiogenic  Shock,.  Sonographer:    Jeryl ColumbiaJohanna Elliott Referring Phys: [id number] Breya Cass R Quinlyn Tep IMPRESSIONS  1. Severely reduced LVEF (25-30%) with global HK. LVOT VTI 10 cm which equates to CO/CI of 2.0 L/min and 1.0 L/min/m2.  2. Left ventricular ejection fraction, by visual estimation, is 25 to 30%. The left ventricle has severely decreased function. There is borderline left ventricular hypertrophy.  3. Left ventricular diastolic parameters are consistent with Grade II diastolic dysfunction (pseudonormalization).  4. The left ventricle demonstrates global hypokinesis.  5. Global right ventricle has severely reduced systolic function.The right ventricular size is mildly enlarged. No increase in right ventricular wall thickness.  6. Left atrial size was normal.  7. Right atrial size was mild-moderately dilated.  8. Presence of pericardial fat pad.  9. The pericardial effusion is circumferential. 10. Trivial pericardial effusion is present. 11. The mitral valve is degenerative. Mild mitral valve regurgitation. 12. The tricuspid valve is grossly normal. Tricuspid valve regurgitation moderate-severe. 13. The aortic valve is tricuspid. Aortic valve regurgitation is trivial. Mild to moderate aortic valve sclerosis/calcification without any evidence of aortic stenosis. 14. The pulmonic valve was grossly normal. Pulmonic valve regurgitation is not visualized. 15. Normal pulmonary artery systolic pressure. 16. The inferior vena cava is dilated in size with <50% respiratory variability, suggesting right atrial pressure of 15 mmHg. 17. The tricuspid regurgitant velocity is 1.89 m/s, and with an assumed right atrial pressure of 15 mmHg, the estimated right ventricular systolic pressure is normal at 29.3 mmHg. 18. Elevated left atrial pressure. FINDINGS  Left Ventricle: Left ventricular ejection fraction, by visual estimation, is 25 to 30%. The left ventricle has severely decreased function. The left ventricle demonstrates global  hypokinesis. The left ventricular internal cavity size was the left ventricle is normal in size. There is  borderline left ventricular hypertrophy. Left ventricular diastolic parameters are consistent with Grade II diastolic dysfunction (pseudonormalization). Elevated left atrial pressure. Severely reduced LVEF (25-30%) with global HK. LVOT VTI 10 cm which equates to CO/CI of 2.0 L/min and 1.0 L/min/m2. Right Ventricle: The right ventricular size is mildly enlarged. No increase in right ventricular wall thickness. Global RV systolic function is has severely reduced systolic function. The tricuspid regurgitant velocity is 1.89 m/s, and with an assumed right atrial pressure of 15 mmHg, the estimated right ventricular systolic pressure is normal at 29.3 mmHg. Left Atrium: Left atrial size was normal in size. Right Atrium: Right atrial size was mild-moderately dilated Pericardium: Trivial pericardial effusion is present. The pericardial effusion is circumferential. Presence of pericardial fat pad. Mitral Valve: The mitral valve is degenerative in appearance. Mild mitral valve regurgitation. Tricuspid Valve: The tricuspid valve is grossly normal. Tricuspid valve regurgitation moderate-severe. Aortic Valve: The aortic valve is tricuspid. Aortic valve regurgitation is trivial. Aortic regurgitation PHT measures 816 msec. Mild to moderate aortic valve sclerosis/calcification is present, without any evidence of aortic stenosis. Pulmonic Valve: The pulmonic valve was grossly normal. Pulmonic valve regurgitation is not visualized. Pulmonic regurgitation is not visualized. Aorta: The aortic root is normal in size and structure. Venous: The inferior vena cava is dilated in size with less than 50% respiratory variability, suggesting right atrial pressure of 15 mmHg. IAS/Shunts: No atrial level shunt detected by color flow Doppler.  LEFT VENTRICLE PLAX 2D LVIDd:         3.50 cm  Diastology LVIDs:         2.70 cm  LV e' lateral:    8.16 cm/s LV PW:         1.80 cm  LV E/e' lateral: 11.0 LV IVS:        1.20 cm  LV e' medial:    5.98 cm/s LVOT diam:     2.00 cm  LV E/e' medial:  15.0 LV SV:         24 ml LV SV Index:   12.35 LVOT Area:     3.14 cm  RIGHT VENTRICLE RV S prime:     7.94 cm/s LEFT ATRIUM           Index       RIGHT ATRIUM           Index LA diam:      3.30 cm 1.70 cm/m  RA Area:     21.20 cm LA Vol (A2C): 59.4 ml 30.65 ml/m RA Volume:   58.10 ml  29.98 ml/m LA Vol (A4C): 48.3 ml 24.92 ml/m  AORTIC VALVE LVOT Vmax:   61.13 cm/s LVOT Vmean:  41.421 cm/s LVOT VTI:    0.097 m AI PHT:      816 msec  AORTA Ao Root diam: 3.40 cm MITRAL VALVE                        TRICUSPID VALVE MV Area (PHT): 3.12 cm             TR Peak grad:   14.3 mmHg MV PHT:        70.47 msec           TR Vmax:        189.00 cm/s MV Decel Time: 243 msec MV E velocity: 89.50 cm/s 103 cm/s  SHUNTS MV A velocity: 66.00 cm/s 70.3 cm/s Systemic VTI:  0.10 m MV E/A ratio:  1.36  1.5       Systemic Diam: 2.00 cm  Lennie OdorWesley O'Neal MD Electronically signed by Lennie OdorWesley O'Neal MD Signature Date/Time: 08/29/2019/3:38:36 PM    Final      Medications:     Scheduled Medications: . aspirin  81 mg Oral Daily  . chlorhexidine  15 mL Mouth Rinse BID  . Chlorhexidine Gluconate Cloth  6 each Topical Daily  . feeding supplement (ENSURE ENLIVE)  237 mL Oral TID BM  . insulin aspart  0-9 Units Subcutaneous TID WC  . mouth rinse  15 mL Mouth Rinse q12n4p  . multivitamin with minerals  1 tablet Oral Daily  . pravastatin  40 mg Oral q1800  . sodium chloride flush  3 mL Intravenous Q12H    Infusions: . sodium chloride    . amiodarone 30 mg/hr (08/31/19 0500)  . heparin 800 Units/hr (08/31/19 0500)  . norepinephrine (LEVOPHED) Adult infusion Stopped (08/30/19 0655)    PRN Medications: sodium chloride, acetaminophen, ondansetron (ZOFRAN) IV, sodium chloride flush   Assessment/Plan:    1. Acute systolic HF with severe biventricular dysfunction  ->  cardiogenic shock - suspect tachy-induced due to VT  - Bedside echo 12/16 EF hard to assess due to tachycardia but likely 25% with severe RV dysfunction.  - CO-OX 61%.  -  lactic acid peaked at 3.7-->0.8 .  - CVP 2. BP stable. Remove A line.  - Creatinine continues to trend down.  - Add digoxin 0.125 mg daily.  - Add 12.5 mg spiro daily.  - Hold off on bidil to allow kidneys to recover.    2. VT  - failed DC-CV in ER on 12/15 - (only on ASA/plavix at home) will need full AC - On amio drip.  - Discussed with EP today. Possible ablation on Monday.   3. CAD - details unclear.  - currently stable.  - troponins ok despite marked hemodynamic stress - ASA/statin   4. AKI on CKD3 - baseline creatinine 1.7  - Creatinine peaked 3.45 . Todays creatinine is back to 1.7.  - due to ATN from shock  5. DM2 - cover with SSI - hgb a1c 7.0  6. Smell of kerosene - carboxyhgb ok   7. Acute hypoxic respiratory failure -Initially on Bipap. Stable oxygen saturations on room air.   8. Hypokalemia:  - K 3.4. Supp K.   9.PAF  -Maintaining NSR.  Continue amio 30 mg per hour + heparin drip.    Move to [facility]. Consult PT/Cardiac Rehab. Will need RHC/LHC next week.  Length of Stay: 3   Amy Clegg NP-C  08/31/2019, 8:03 AM  Advanced Heart Failure Team Pager [phone] (M-F; 7a - 4p)  Please contact [facility] for night-coverage after hours (4p -7a ) and w  Patient seen and examined with the above-signed Advanced Practice Provider and/or Housestaff. I personally reviewed laboratory data, imaging studies and relevant notes. I independently examined the patient and formulated the important aspects of the plan. I have edited the note to reflect any of my changes or salient points. I have personally discussed the plan with the patient and/or family.  VT quiescent on amiodarone. Pressors weaned off. Co-ox 61%. Echo with EF ~25% with severe RV dysfunction as well.   Lactic acidosis resolved.  Creatinine improving. Volume status ok.   Agree with adding spiro and digoxin. No b-blocker yet  D/w EP. Plan R/L cath next week. If NICM will need to consider ICD. Otherwise likely will need LifeVest.   Supp K. Check  mag in am.   Arvilla Meres, MD  5:42 PM

## 2019-08-31 NOTE — Progress Notes (Signed)
ANTICOAGULATION CONSULT NOTE - Follow-Up Consult  Pharmacy Consult for Heparin Indication: atrial fibrillation  No Known Allergies  Patient Measurements: Height: 5\' 11"  (180.3 cm) Weight: 160 lb 0.9 oz (72.6 kg) IBW/kg (Calculated) : 75.3 Heparin Dosing Weight: 74kg  Vital Signs: Temp: 98.4 F (36.9 C) (12/18 0336) Temp Source: Oral (12/18 0336) BP: 160/67 (12/18 0530) Pulse Rate: 59 (12/18 0530)  Labs: Recent Labs    08/28/19 1919 08/28/19 2126 08/29/19 0059 08/29/19 0109 08/29/19 1106 08/30/19 0341 08/30/19 1405 08/30/19 2324 08/31/19 0311  HGB 10.6*  --  10.6*  --   --  10.2*  --  9.9* 9.9*  HCT 31.7*  --  32.9*  --   --  29.8*  --  29.0* 29.5*  PLT 184  --  179  --   --  169  --   --  149*  LABPROT  --   --  19.0*  --   --   --   --   --   --   INR  --   --  1.6*  --   --   --   --   --   --   HEPARINUNFRC  --   --   --    < > 1.20* 0.49 0.55  --  0.44  CREATININE 3.32*  --  3.42*   < > 3.16* 2.26*  --   --  1.70*  TROPONINIHS 35* 40* 65*  --   --   --   --   --   --    < > = values in this interval not displayed.    Estimated Creatinine Clearance: 34.4 mL/min (A) (by C-G formula based on SCr of 1.7 mg/dL (H)).   Medical History: Past Medical History:  Diagnosis Date  . Arthritis    shoulder  . Asthma   . Atrial fibrillation ([place])   . Chronic kidney disease    stage III  . COPD (chronic obstructive pulmonary disease) ([place])   . Coronary artery disease   . Diabetes mellitus without complication ([place])    type 2  . Dysrhythmia    [facility] HEART IRREGULARLY IRREGULAR  . Full dentures    upper and lower  . Hypercholesteremia   . Hypertension   . Myocardial infarction ([place])   . Shortness of breath dyspnea   . Swelling    FEET AND LEGS     Assessment: 5 yoM admitted with cardiogenic shock and AFib s/p DCCV.  -Heparin level therapeutic, Hgb stable 9.9(stable). -No further bleeding noted  Goal of Therapy:  Heparin level 0.3-0.7  units/ml Monitor platelets by anticoagulation protocol: Yes   Plan:  -Cont heparin at 800 units/hr -Will need long term oral AC at discharge -Monitor for any further bleeding  Erin Hearing PharmD., BCPS Clinical Pharmacist 08/31/2019 7:59 AM

## 2019-08-31 NOTE — Progress Notes (Addendum)
   Discussed with Dr. Curt Bears  We will continue to follow along for plan pending ischemic work up early next week. Please call with any questions!  Thank you Beryle Beams" [place], [place]  08/31/2019 11:53 AM      EP to be available as needed over the weekend.  Thompson Grayer MD, [facility] 09/01/2019 7:23 AM

## 2019-09-01 LAB — COOXEMETRY PANEL
Carboxyhemoglobin: 0.9 % (ref 0.5–1.5)
Carboxyhemoglobin: 1 % (ref 0.5–1.5)
Methemoglobin: 0.6 % (ref 0.0–1.5)
Methemoglobin: 0.6 % (ref 0.0–1.5)
O2 Saturation: 49.5 %
O2 Saturation: 59 %
Total hemoglobin: 9.8 g/dL — ABNORMAL LOW (ref 12.0–16.0)
Total hemoglobin: 10.1 g/dL — ABNORMAL LOW (ref 12.0–16.0)

## 2019-09-01 LAB — CBC
HCT: 30 % — ABNORMAL LOW (ref 39.0–52.0)
Hemoglobin: 9.5 g/dL — ABNORMAL LOW (ref 13.0–17.0)
MCH: 28.6 pg (ref 26.0–34.0)
MCHC: 31.7 g/dL (ref 30.0–36.0)
MCV: 90.4 fL (ref 80.0–100.0)
Platelets: 142 10*3/uL — ABNORMAL LOW (ref 150–400)
RBC: 3.32 MIL/uL — ABNORMAL LOW (ref 4.22–5.81)
RDW: 16.2 % — ABNORMAL HIGH (ref 11.5–15.5)
WBC: 7.1 10*3/uL (ref 4.0–10.5)
nRBC: 0 % (ref 0.0–0.2)

## 2019-09-01 LAB — BASIC METABOLIC PANEL
Anion gap: 8 (ref 5–15)
BUN: 17 mg/dL (ref 8–23)
CO2: 26 mmol/L (ref 22–32)
Calcium: 8.5 mg/dL — ABNORMAL LOW (ref 8.9–10.3)
Chloride: 104 mmol/L (ref 98–111)
Creatinine, Ser: 1.34 mg/dL — ABNORMAL HIGH (ref 0.61–1.24)
GFR calc Af Amer: 57 mL/min — ABNORMAL LOW (ref >60)
GFR calc non Af Amer: 49 mL/min — ABNORMAL LOW (ref >60)
Glucose, Bld: 109 mg/dL — ABNORMAL HIGH (ref 70–99)
Potassium: 4.6 mmol/L (ref 3.5–5.1)
Sodium: 138 mmol/L (ref 135–145)

## 2019-09-01 LAB — GLUCOSE, CAPILLARY
Glucose-Capillary: 91 mg/dL (ref 70–99)
Glucose-Capillary: 124 mg/dL — ABNORMAL HIGH (ref 70–99)
Glucose-Capillary: 90 mg/dL (ref 70–99)
Glucose-Capillary: 113 mg/dL — ABNORMAL HIGH (ref 70–99)

## 2019-09-01 LAB — MAGNESIUM: Magnesium: 1.7 mg/dL (ref 1.7–2.4)

## 2019-09-01 LAB — HEPARIN LEVEL (UNFRACTIONATED): Heparin Unfractionated: 0.39 IU/mL (ref 0.30–0.70)

## 2019-09-01 MED ORDER — ENOXAPARIN SODIUM 40 MG/0.4ML ~~LOC~~ SOLN
40.0000 mg | SUBCUTANEOUS | Status: DC
  Administered 2019-09-02 – 2019-09-03 (×2): 40 mg via SUBCUTANEOUS
  Filled 2019-09-01 (×2): qty 0.4

## 2019-09-01 MED ORDER — HYDRALAZINE HCL 10 MG PO TABS
10.0000 mg | ORAL_TABLET | Freq: Once | ORAL | Status: AC
  Administered 2019-09-01: 10 mg via ORAL
  Filled 2019-09-01: qty 1

## 2019-09-01 MED ORDER — HYDRALAZINE HCL 25 MG PO TABS
25.0000 mg | ORAL_TABLET | Freq: Once | ORAL | Status: AC
  Administered 2019-09-01: 25 mg via ORAL
  Filled 2019-09-01: qty 1

## 2019-09-01 MED ORDER — SACUBITRIL-VALSARTAN 24-26 MG PO TABS: 1.0000 | ORAL_TABLET | Freq: Two times a day (BID) | ORAL | Status: DC

## 2019-09-01 MED ORDER — SACUBITRIL-VALSARTAN 24-26 MG PO TABS
1.0000 | ORAL_TABLET | Freq: Two times a day (BID) | ORAL | Status: DC
  Administered 2019-09-01: 1 via ORAL
  Filled 2019-09-01 (×2): qty 1

## 2019-09-01 MED ORDER — MAGNESIUM SULFATE 2 GM/50ML IV SOLN
2.0000 g | Freq: Once | INTRAVENOUS | Status: AC
  Administered 2019-09-01: 2 g via INTRAVENOUS
  Filled 2019-09-01: qty 50

## 2019-09-01 NOTE — Progress Notes (Addendum)
Pt's BP running high all this evening. Asked pharmacist to switch his entresto timing and given that at 1604. Pt's BP still remained high upper 180-190's, double checked with manual blood pressure per request with MD Georgette Shell and it is still 180/70 and notified again via paging and now waiting for further orders. Meanwhile pt is asymptomatic, sitting in a recliner and eating his dinner, denies pain and discomfort.  Palma Holter, RN

## 2019-09-01 NOTE — Plan of Care (Signed)

## 2019-09-01 NOTE — Progress Notes (Signed)
CARDIAC REHAB PHASE I   PRE:  Rate/Rhythm: 56 SB  BP:  Supine:   Sitting: 135/68  Standing:    SaO2: 95%RA  MODE:  Ambulation: 340 ft   POST:  Rate/Rhythm: 65 SR  BP:  Supine:   Sitting: 141/72  Standing:    SaO2: 98%RA 0854-0930 Pt walked 340 ft on RA with rolling walker and minimal assist for equipment. He tolerated well. To recliner after walk. No complaints. Call bell in reach.   Graylon Good, RN BSN  09/01/2019 9:25 AM

## 2019-09-01 NOTE — Progress Notes (Signed)
Advanced Heart Failure Rounding Note   Subjective:    Remains on IV amio. No further VT. Off inotropes. Co-ox 49.5% this am (repeating) -> 59%  CVP 3. SBP 150s. Creatinine improved to 1.3  Denies CP or SOB. No orthopnea or PND. Walked > 300 feet this am   Objective:   Weight Range:  Vital Signs:   Temp:  [97.8 F (36.6 C)-98.5 F (36.9 C)] 97.8 F (36.6 C) (12/19 1106) Pulse Rate:  [53-63] 53 (12/19 1106) Resp:  [14-26] 19 (12/19 1106) BP: (135-167)/(60-91) 153/68 (12/19 1106) SpO2:  [92 %-100 %] 100 % (12/19 1106) Weight:  [72.7 kg] 72.7 kg (12/19 0500) Last BM Date: 08/31/19  Weight change: Filed Weights   08/30/19 0500 08/31/19 0530 09/01/19 0500  Weight: 72.5 kg 72.6 kg 72.7 kg    Intake/Output:   Intake/Output Summary (Last 24 hours) at 09/01/2019 1152 Last data filed at 09/01/2019 0830 Gross per 24 hour  Intake 550.74 ml  Output 1575 ml  Net -1024.26 ml     Physical Exam: General:  Sitting in chair No resp difficulty HEENT: normal Neck: supple. no JVD. RIJ TLC Carotids 2+ bilat; no bruits. No lymphadenopathy or thryomegaly appreciated. Cor: PMI nondisplaced. Regular rate & rhythm. No rubs, gallops or murmurs. Lungs: clear Abdomen: soft, nontender, nondistended. No hepatosplenomegaly. No bruits or masses. Good bowel sounds. Extremities: no cyanosis, clubbing, rash, edema Neuro: alert & orientedx3, cranial nerves grossly intact. moves all 4 extremities w/o difficulty. Affect pleasant  Telemetry: Sinus 50-60s personally reviewed.  Labs: Basic Metabolic Panel: Recent Labs  Lab 08/28/19 1919 08/29/19 0451 08/29/19 0804 08/29/19 1106 08/30/19 0341 08/30/19 2324 08/31/19 0311 09/01/19 0248  NA 140  --  144 143 143 139 141 138  K 5.2*  --  4.0 3.7 3.0* 3.1* 3.4* 4.6  CL 111  --  106 105 103  --  100 104  CO2 11*  --  20* 20* 29  --  32 26  GLUCOSE 143*  --  267* 232* 130*  --  104* 109*  BUN 33*  --  38* 39* 35*  --  24* 17  CREATININE  3.32*  --  3.45* 3.16* 2.26*  --  1.70* 1.34*  CALCIUM 8.2*  --  8.5* 8.4* 7.6*  --  7.9* 8.5*  MG 2.1 1.8  --   --   --   --   --  1.7  PHOS 6.3* 7.0*  --   --   --   --   --   --     Liver Function Tests: Recent Labs  Lab 08/28/19 1919 08/29/19 0059  AST 75* 77*  ALT 51* 63*  ALKPHOS 85 83  BILITOT 1.1 0.9  PROT 6.1* 6.1*  ALBUMIN 3.4* 3.3*   No results for input(s): LIPASE, AMYLASE in the last 168 hours. No results for input(s): AMMONIA in the last 168 hours.  CBC: Recent Labs  Lab 08/28/19 1919 08/29/19 0059 08/29/19 0802 08/30/19 0341 08/30/19 2324 08/31/19 0311 09/01/19 0248  WBC 10.9* 13.9*  --  13.9*  --  8.4 7.1  NEUTROABS 8.4*  --   --   --   --   --   --   HGB 10.6* 10.6* 11.2* 10.2* 9.9* 9.9* 9.5*  HCT 31.7* 32.9* 33.0* 29.8* 29.0* 29.5* 30.0*  MCV 85.7 90.9  --  86.6  --  87.8 90.4  PLT 184 179  --  169  --  149* 142*  Cardiac Enzymes: No results for input(s): CKTOTAL, CKMB, CKMBINDEX, TROPONINI in the last 168 hours.  BNP: BNP (last 3 results) Recent Labs    08/28/19 1919  BNP 1,337.0*    ProBNP (last 3 results) No results for input(s): PROBNP in the last 8760 hours.    Other results:  Imaging: No results found.   Medications:     Scheduled Medications: . aspirin  81 mg Oral Daily  . Chlorhexidine Gluconate Cloth  6 each Topical Daily  . digoxin  0.125 mg Oral Daily  . feeding supplement (ENSURE ENLIVE)  237 mL Oral TID BM  . insulin aspart  0-9 Units Subcutaneous TID WC  . multivitamin with minerals  1 tablet Oral Daily  . pravastatin  40 mg Oral q1800  . sodium chloride flush  3 mL Intravenous Q12H  . spironolactone  12.5 mg Oral Daily    Infusions: . sodium chloride    . amiodarone 30 mg/hr (09/01/19 0658)  . heparin 800 Units/hr (09/01/19 0658)    PRN Medications: sodium chloride, acetaminophen, ondansetron (ZOFRAN) IV, sodium chloride flush   Assessment/Plan:    1. Acute systolic HF with severe biventricular  dysfunction  -> cardiogenic shock - Echo with EF 25% with severe RV dysfunction - Suspect VT due to low EF (not vice versa) - CO-OX 61%.-> 49% (repat 59%).  - CVP 3 - Creatinine continues to improve - Continue digoxin 0.125 mg daily.  - Continue 12.5 mg spiro daily.  - Start Entresto 24/26 bid - R/L cath Monday  2. VT  - failed DC-CV in ER on 12/15 - (only on ASA/plavix at home) will need full AC - On amio and heparin drips (thoguht it was AF). Now in sinus - Will need cath on Monday.  - EP following for ICD vs LifeVest - Keep K> 4.0 Mg > 2.0 - With no AF. Can stop heparin   3. CAD - details unclear.  - currently stable.  - troponins ok despite marked hemodynamic stress - R/L cath Monday to assess for ischemic cause of VT - ASA/statin   4. AKI on CKD3 - baseline creatinine 1.7  - Creatinine peaked 3.45 . Todays creatinine is back to 1.34  - due to ATN from shock  5. DM2 - cover with SSI - hgb a1c 7.0  6. Smell of kerosene - carboxyhgb ok   7. Acute hypoxic respiratory failure -Initially on Bipap. Stable oxygen saturations on room air.   8. Hypokalemia:  - K 4.6   9. Hypomag - Mg 1.7 supp    Length of Stay: 4   Richrd Kuzniar MD 09/01/2019, 11:52 AM  Advanced Heart Failure Team Pager [phone] (M-F; 7a - 4p)  Please contact [facility] for night-coverage after hours (4p -7a ) and w

## 2019-09-01 NOTE — Progress Notes (Signed)
Patient continues to be hypertensive at 183/70 (103) HR 53. Asymptomatic. Notified on-call.

## 2019-09-01 NOTE — Evaluation (Signed)
Physical Therapy Evaluation Patient Details Name: [name] [name] MRN: [id number] DOB: 1934/12/12 Today's Date: 09/01/2019   History of Present Illness  84 y/o male with ho. DM2, HTN. HL, CAD, chronic AF and CKD 3a (baseline creatinine 1.3-1.8) transferred from [facility] fur further management of cardiogenic shock in setting of atrial flutter/tach with RVR.  Clinical Impression  Pt presents to PT with deficits in gait and balance compared to baseline. Pt ambualting modI with use of RW, refusing to attempt ambulation without RW at this time, but also adamant that he will not require use of an assistive device when returning home. Pt will benefit from further assessment of gait and dynamic balance without UE support. PT encourages pt to ambulate out of the room at least 3x per day to continue improving gait, balance, and activity tolerance. PT anticipate no PT or DME needs at time of discharge.    Follow Up Recommendations No PT follow up    Equipment Recommendations  None recommended by PT    Recommendations for Other Services       Precautions / Restrictions Precautions Precautions: Fall Restrictions Weight Bearing Restrictions: No      Mobility  Bed Mobility Overal bed mobility: (pt received and left in recliner)                Transfers Overall transfer level: Modified independent Equipment used: Rolling walker (2 wheeled)                Ambulation/Gait Ambulation/Gait assistance: Modified independent (Device/Increase time) Gait Distance (Feet): 600 Feet Assistive device: Rolling walker (2 wheeled) Gait Pattern/deviations: Step-through pattern Gait velocity: functional Gait velocity interpretation: >2.62 ft/sec, indicative of community ambulatory General Gait Details: steady step through gait with BUE support of RW  Stairs            Wheelchair Mobility    Modified Rankin (Stroke Patients Only)       Balance                                              Pertinent Vitals/Pain Pain Assessment: No/denies pain    Home Living Family/patient expects to be discharged to:: Private residence Living Arrangements: Other relatives Available Help at Discharge: Family;Available PRN/intermittently Type of Home: Mobile home Home Access: Level entry     Home Layout: One level Home Equipment: None      Prior Function Level of Independence: Independent               Hand Dominance        Extremity/Trunk Assessment   Upper Extremity Assessment Upper Extremity Assessment: Overall WFL for tasks assessed    Lower Extremity Assessment Lower Extremity Assessment: Overall WFL for tasks assessed    Cervical / Trunk Assessment Cervical / Trunk Assessment: Normal  Communication   Communication: No difficulties  Cognition Arousal/Alertness: Awake/alert Behavior During Therapy: WFL for tasks assessed/performed Overall Cognitive Status: Within Functional Limits for tasks assessed                                        General Comments General comments (skin integrity, edema, etc.): VSS, Pt asymptomatic    Exercises     Assessment/Plan    PT Assessment Patient needs continued PT services  PT Problem  List Decreased strength;Decreased balance       PT Treatment Interventions DME instruction;Gait training;Balance training;Neuromuscular re-education;Patient/family education    PT Goals (Current goals can be found in the Care Plan section)  Acute Rehab PT Goals Patient Stated Goal: To go home PT Goal Formulation: With patient Time For Goal Achievement: 09/15/19 Potential to Achieve Goals: Good    Frequency Min 3X/week   Barriers to discharge        Co-evaluation               AM-PAC PT "6 Clicks" Mobility  Outcome Measure Help needed turning from your back to your side while in a flat bed without using bedrails?: None Help needed moving from lying on your back to sitting  on the side of a flat bed without using bedrails?: None Help needed moving to and from a bed to a chair (including a wheelchair)?: None Help needed standing up from a chair using your arms (e.g., wheelchair or bedside chair)?: None Help needed to walk in hospital room?: None Help needed climbing 3-5 steps with a railing? : A Little 6 Click Score: 23    End of Session Equipment Utilized During Treatment: (none) Activity Tolerance: Patient tolerated treatment well Patient left: in chair;with call bell/phone within reach Nurse Communication: Mobility status PT Visit Diagnosis: Other abnormalities of gait and mobility (R26.89)    Time: 3762-8315 PT Time Calculation (min) (ACUTE ONLY): 22 min   Charges:   PT Evaluation $PT Eval Low Complexity: 1 Low          Arlyss Gandy, PT, DPT Acute Rehabilitation Pager: [phone]   Arlyss Gandy 09/01/2019, 1:49 PM

## 2019-09-02 ENCOUNTER — Other Ambulatory Visit: Payer: Self-pay

## 2019-09-02 LAB — COOXEMETRY PANEL
Carboxyhemoglobin: 0.9 % (ref 0.5–1.5)
Methemoglobin: 0.5 % (ref 0.0–1.5)
O2 Saturation: 58.3 %
Total hemoglobin: 14.5 g/dL (ref 12.0–16.0)

## 2019-09-02 LAB — BASIC METABOLIC PANEL
Anion gap: 8 (ref 5–15)
BUN: 12 mg/dL (ref 8–23)
CO2: 24 mmol/L (ref 22–32)
Calcium: 8.9 mg/dL (ref 8.9–10.3)
Chloride: 105 mmol/L (ref 98–111)
Creatinine, Ser: 1.2 mg/dL (ref 0.61–1.24)
GFR calc Af Amer: >60 mL/min (ref >60)
GFR calc non Af Amer: 56 mL/min — ABNORMAL LOW (ref >60)
Glucose, Bld: 115 mg/dL — ABNORMAL HIGH (ref 70–99)
Potassium: 4.1 mmol/L (ref 3.5–5.1)
Sodium: 137 mmol/L (ref 135–145)

## 2019-09-02 LAB — CULTURE, BLOOD (ROUTINE X 2)
Culture: NO GROWTH
Culture: NO GROWTH
Special Requests: ADEQUATE

## 2019-09-02 LAB — CBC
HCT: 33.4 % — ABNORMAL LOW (ref 39.0–52.0)
Hemoglobin: 10.6 g/dL — ABNORMAL LOW (ref 13.0–17.0)
MCH: 28.7 pg (ref 26.0–34.0)
MCHC: 31.7 g/dL (ref 30.0–36.0)
MCV: 90.5 fL (ref 80.0–100.0)
Platelets: 157 10*3/uL (ref 150–400)
RBC: 3.69 MIL/uL — ABNORMAL LOW (ref 4.22–5.81)
RDW: 16.1 % — ABNORMAL HIGH (ref 11.5–15.5)
WBC: 5.5 10*3/uL (ref 4.0–10.5)
nRBC: 0 % (ref 0.0–0.2)

## 2019-09-02 LAB — GLUCOSE, CAPILLARY
Glucose-Capillary: 92 mg/dL (ref 70–99)
Glucose-Capillary: 116 mg/dL — ABNORMAL HIGH (ref 70–99)
Glucose-Capillary: 146 mg/dL — ABNORMAL HIGH (ref 70–99)
Glucose-Capillary: 127 mg/dL — ABNORMAL HIGH (ref 70–99)

## 2019-09-02 MED ORDER — SODIUM CHLORIDE 0.9% FLUSH
3.0000 mL | Freq: Two times a day (BID) | INTRAVENOUS | Status: DC
  Administered 2019-09-02 (×2): 3 mL via INTRAVENOUS

## 2019-09-02 MED ORDER — SODIUM CHLORIDE 0.9% FLUSH: 3.0000 mL | INTRAVENOUS | Status: DC | PRN

## 2019-09-02 MED ORDER — SODIUM CHLORIDE 0.9 % IV SOLN: 250.0000 mL | INTRAVENOUS | Status: DC | PRN

## 2019-09-02 MED ORDER — SPIRONOLACTONE 25 MG PO TABS
25.0000 mg | ORAL_TABLET | Freq: Every day | ORAL | Status: DC
  Administered 2019-09-02 – 2019-09-06 (×5): 25 mg via ORAL
  Filled 2019-09-02 (×5): qty 1

## 2019-09-02 MED ORDER — SACUBITRIL-VALSARTAN 49-51 MG PO TABS
1.0000 | ORAL_TABLET | Freq: Two times a day (BID) | ORAL | Status: DC
  Administered 2019-09-02 – 2019-09-05 (×8): 1 via ORAL
  Filled 2019-09-02 (×11): qty 1

## 2019-09-02 MED ORDER — SODIUM CHLORIDE 0.9 % IV SOLN: INTRAVENOUS | Status: DC

## 2019-09-02 MED ORDER — ISOSORB DINITRATE-HYDRALAZINE 20-37.5 MG PO TABS
1.0000 | ORAL_TABLET | Freq: Three times a day (TID) | ORAL | Status: DC
  Administered 2019-09-02 – 2019-09-06 (×13): 1 via ORAL
  Filled 2019-09-02 (×13): qty 1

## 2019-09-02 MED ORDER — GUAIFENESIN-DM 100-10 MG/5ML PO SYRP
5.0000 mL | ORAL_SOLUTION | ORAL | Status: DC | PRN
  Administered 2019-09-02: 5 mL via ORAL
  Filled 2019-09-02: qty 5

## 2019-09-02 MED ORDER — ASPIRIN 81 MG PO CHEW
81.0000 mg | CHEWABLE_TABLET | ORAL | Status: AC
  Administered 2019-09-03: 81 mg via ORAL
  Filled 2019-09-02: qty 1

## 2019-09-02 NOTE — Progress Notes (Signed)
Advanced Heart Failure Rounding Note   Subjective:    Remains on IV amio. No further VT. Off inotropes. Co-ox 58% this am.  Persistently hypertensive overnight with SBP 170-180s despite hydralazine and low-dose Entresto.   CVP 3-4. Denies SOB, orthopnea or PND   Objective:   Weight Range:  Vital Signs:   Temp:  [97.8 F (36.6 C)-98.9 F (37.2 C)] 98.9 F (37.2 C) (12/20 0752) Pulse Rate:  [52-70] 60 (12/20 0752) Resp:  [13-19] 18 (12/20 0752) BP: (129-183)/(51-76) 164/76 (12/20 0752) SpO2:  [95 %-100 %] 98 % (12/20 0752) Last BM Date: 09/01/19  Weight change: Filed Weights   08/30/19 0500 08/31/19 0530 09/01/19 0500  Weight: 72.5 kg 72.6 kg 72.7 kg    Intake/Output:   Intake/Output Summary (Last 24 hours) at 09/02/2019 0842 Last data filed at 09/02/2019 0800 Gross per 24 hour  Intake 740 ml  Output 1925 ml  Net -1185 ml     Physical Exam: General:  Sitting up in bed. No resp difficulty HEENT: normal Neck: supple. no JVD. Carotids 2+ bilat; no bruits. No lymphadenopathy or thryomegaly appreciated. Cor: PMI nondisplaced. Regular rate & rhythm. 2/6 MR Lungs: clear Abdomen: soft, nontender, nondistended. No hepatosplenomegaly. No bruits or masses. Good bowel sounds. Extremities: no cyanosis, clubbing, rash, edema Neuro: alert & orientedx3, cranial nerves grossly intact. moves all 4 extremities w/o difficulty. Affect pleasant   Telemetry: Sinus 50-60s personally reviewed.  Labs: Basic Metabolic Panel: Recent Labs  Lab 08/28/19 1919 08/29/19 0451 08/29/19 0802 08/29/19 1106 08/30/19 0341 08/30/19 2324 08/31/19 0311 09/01/19 0248 09/02/19 0509  NA 140  --   --  143 143 139 141 138 137  K 5.2*  --   --  3.7 3.0* 3.1* 3.4* 4.6 4.1  CL 111  --    < > 105 103  --  100 104 105  CO2 11*  --    < > 20* 29  --  32 26 24  GLUCOSE 143*  --    < > 232* 130*  --  104* 109* 115*  BUN 33*  --    < > 39* 35*  --  24* 17 12  CREATININE 3.32*  --    < > 3.16*  2.26*  --  1.70* 1.34* 1.20  CALCIUM 8.2*  --    < > 8.4* 7.6*  --  7.9* 8.5* 8.9  MG 2.1 1.8  --   --   --   --   --  1.7  --   PHOS 6.3* 7.0*  --   --   --   --   --   --   --    < > = values in this interval not displayed.    Liver Function Tests: Recent Labs  Lab 08/28/19 1919 08/29/19 0059  AST 75* 77*  ALT 51* 63*  ALKPHOS 85 83  BILITOT 1.1 0.9  PROT 6.1* 6.1*  ALBUMIN 3.4* 3.3*   No results for input(s): LIPASE, AMYLASE in the last 168 hours. No results for input(s): AMMONIA in the last 168 hours.  CBC: Recent Labs  Lab 08/28/19 1919 08/29/19 0059 08/30/19 0341 08/30/19 2324 08/31/19 0311 09/01/19 0248 09/02/19 0509  WBC 10.9* 13.9* 13.9*  --  8.4 7.1 5.5  NEUTROABS 8.4*  --   --   --   --   --   --   HGB 10.6* 10.6* 10.2* 9.9* 9.9* 9.5* 10.6*  HCT 31.7* 32.9* 29.8* 29.0* 29.5*  30.0* 33.4*  MCV 85.7 90.9 86.6  --  87.8 90.4 90.5  PLT 184 179 169  --  149* 142* 157    Cardiac Enzymes: No results for input(s): CKTOTAL, CKMB, CKMBINDEX, TROPONINI in the last 168 hours.  BNP: BNP (last 3 results) Recent Labs    08/28/19 1919  BNP 1,337.0*    ProBNP (last 3 results) No results for input(s): PROBNP in the last 8760 hours.    Other results:  Imaging: No results found.   Medications:     Scheduled Medications: . aspirin  81 mg Oral Daily  . Chlorhexidine Gluconate Cloth  6 each Topical Daily  . digoxin  0.125 mg Oral Daily  . enoxaparin (LOVENOX) injection  40 mg Subcutaneous Q24H  . feeding supplement (ENSURE ENLIVE)  237 mL Oral TID BM  . insulin aspart  0-9 Units Subcutaneous TID WC  . isosorbide-hydrALAZINE  1 tablet Oral TID  . multivitamin with minerals  1 tablet Oral Daily  . pravastatin  40 mg Oral q1800  . sacubitril-valsartan  1 tablet Oral BID  . sodium chloride flush  3 mL Intravenous Q12H  . spironolactone  12.5 mg Oral Daily    Infusions: . sodium chloride    . amiodarone 30 mg/hr (09/02/19 0602)    PRN  Medications: sodium chloride, acetaminophen, ondansetron (ZOFRAN) IV, sodium chloride flush   Assessment/Plan:    1. Acute systolic HF with severe biventricular dysfunction  -> cardiogenic shock - Echo with EF 25% with severe RV dysfunction - Suspect VT due to low EF (not vice versa) - CO-OX 58% - CVP 3 - Creatinine continues to improve - Continue digoxin 0.125 mg daily.  - BP very high - Increase spiro to 25 daily  - Increase Entresto 49/51 bid - Add Bidil - R/L cath tomorrow   2. VT  - failed DC-CV in ER on 12/15 - (only on ASA/plavix at home) will need full AC - On amio and heparin drips (thoguht it was AF). Now in sinus - Cath tomorrow - EP following for ICD vs LifeVest - Keep K> 4.0 Mg > 2.0  3. CAD - details unclear.  - currently stable.  - troponins ok despite marked hemodynamic stress - R/L cath tomorrow to assess for ischemic cause of VT - ASA/statin   4. AKI on CKD3 - baseline creatinine 1.7  - Creatinine peaked 3.45 . Todays creatinine is back to 1.20 - due to ATN from shock  5. DM2 - cover with SSI - hgb a1c 7.0  6. Smell of kerosene - carboxyhgb ok   7. Acute hypoxic respiratory failure -Initially on Bipap. Stable oxygen saturations on room air.   8. Hypokalemia:  - K 4.1 today  9. Hypomag - Mg 1.7 supped yesterday    Length of Stay: 5   Arvilla Meres MD 09/02/2019, 8:42 AM  Advanced Heart Failure Team Pager [phone] (M-F; 7a - 4p)  Please contact [facility] for night-coverage after hours (4p -7a ) and w

## 2019-09-02 NOTE — Plan of Care (Signed)

## 2019-09-02 NOTE — Progress Notes (Signed)
Patient has complaints of throat congestion, occasional cough, and runny nose.  Received verbal order from Dr. Hassell Done for mucinex, robitussin. Placed order for swallow eval.

## 2019-09-02 NOTE — Progress Notes (Signed)
Consent is taken for cardiac cath tomorrow, pt is aware of the procedure and NPO status since midnight. Pt's BP is under controlled this afternoon. Pt ambulated in a hallway without difficulty in breathing and distress.Pt is sitting in a recliner this time, will continue to monitor the patient  Palma Holter, RN

## 2019-09-03 ENCOUNTER — Encounter (HOSPITAL_COMMUNITY)
Admission: EM | Disposition: A | Discharge status: Home / Self Care | Payer: Self-pay | Source: Other Acute Inpatient Hospital | Attending: Internal Medicine

## 2019-09-03 DIAGNOSIS — I2510 Atherosclerotic heart disease of native coronary artery without angina pectoris: Secondary | ICD-10-CM

## 2019-09-03 HISTORY — PX: RIGHT/LEFT HEART CATH AND CORONARY ANGIOGRAPHY: CATH118266

## 2019-09-03 LAB — POCT I-STAT EG7
Acid-base deficit: 1 mmol/L (ref 0.0–2.0)
Acid-base deficit: 16 mmol/L — ABNORMAL HIGH (ref 0.0–2.0)
Bicarbonate: 13.5 mmol/L — ABNORMAL LOW (ref 20.0–28.0)
Bicarbonate: 24.4 mmol/L (ref 20.0–28.0)
Bicarbonate: 25.2 mmol/L (ref 20.0–28.0)
Calcium, Ion: 1.19 mmol/L (ref 1.15–1.40)
Calcium, Ion: 1.22 mmol/L (ref 1.15–1.40)
Calcium, Ion: 1.22 mmol/L (ref 1.15–1.40)
HCT: 30 % — ABNORMAL LOW (ref 39.0–52.0)
HCT: 31 % — ABNORMAL LOW (ref 39.0–52.0)
HCT: 31 % — ABNORMAL LOW (ref 39.0–52.0)
Hemoglobin: 10.2 g/dL — ABNORMAL LOW (ref 13.0–17.0)
Hemoglobin: 10.5 g/dL — ABNORMAL LOW (ref 13.0–17.0)
Hemoglobin: 10.5 g/dL — ABNORMAL LOW (ref 13.0–17.0)
O2 Saturation: 47 %
O2 Saturation: 66 %
O2 Saturation: 67 %
Potassium: 4.1 mmol/L (ref 3.5–5.1)
Potassium: 4.2 mmol/L (ref 3.5–5.1)
Potassium: 4.2 mmol/L (ref 3.5–5.1)
Sodium: 139 mmol/L (ref 135–145)
Sodium: 139 mmol/L (ref 135–145)
Sodium: 140 mmol/L (ref 135–145)
TCO2: 15 mmol/L — ABNORMAL LOW (ref 22–32)
TCO2: 26 mmol/L (ref 22–32)
TCO2: 26 mmol/L (ref 22–32)
pCO2, Ven: 40.9 mmHg — ABNORMAL LOW (ref 44.0–60.0)
pCO2, Ven: 41.2 mmHg — ABNORMAL LOW (ref 44.0–60.0)
pCO2, Ven: 44.4 mmHg (ref 44.0–60.0)
pH, Ven: 7.09 — CL (ref 7.250–7.430)
pH, Ven: 7.383 (ref 7.250–7.430)
pH, Ven: 7.395 (ref 7.250–7.430)
pO2, Ven: 35 mmHg (ref 32.0–45.0)
pO2, Ven: 35 mmHg (ref 32.0–45.0)
pO2, Ven: 35 mmHg (ref 32.0–45.0)

## 2019-09-03 LAB — CULTURE, BLOOD (ROUTINE X 2)
Culture: NO GROWTH
Culture: NO GROWTH

## 2019-09-03 LAB — CBC
HCT: 31.8 % — ABNORMAL LOW (ref 39.0–52.0)
Hemoglobin: 10.3 g/dL — ABNORMAL LOW (ref 13.0–17.0)
MCH: 28.9 pg (ref 26.0–34.0)
MCHC: 32.4 g/dL (ref 30.0–36.0)
MCV: 89.1 fL (ref 80.0–100.0)
Platelets: 157 10*3/uL (ref 150–400)
RBC: 3.57 MIL/uL — ABNORMAL LOW (ref 4.22–5.81)
RDW: 15.9 % — ABNORMAL HIGH (ref 11.5–15.5)
WBC: 6.1 10*3/uL (ref 4.0–10.5)
nRBC: 0 % (ref 0.0–0.2)

## 2019-09-03 LAB — BASIC METABOLIC PANEL
Anion gap: 8 (ref 5–15)
BUN: 14 mg/dL (ref 8–23)
CO2: 25 mmol/L (ref 22–32)
Calcium: 8.6 mg/dL — ABNORMAL LOW (ref 8.9–10.3)
Chloride: 104 mmol/L (ref 98–111)
Creatinine, Ser: 1.47 mg/dL — ABNORMAL HIGH (ref 0.61–1.24)
GFR calc Af Amer: 51 mL/min — ABNORMAL LOW (ref >60)
GFR calc non Af Amer: 44 mL/min — ABNORMAL LOW (ref >60)
Glucose, Bld: 130 mg/dL — ABNORMAL HIGH (ref 70–99)
Potassium: 4.2 mmol/L (ref 3.5–5.1)
Sodium: 137 mmol/L (ref 135–145)

## 2019-09-03 LAB — POCT I-STAT 7, (LYTES, BLD GAS, ICA,H+H)
Acid-base deficit: 2 mmol/L (ref 0.0–2.0)
Bicarbonate: 22.4 mmol/L (ref 20.0–28.0)
Calcium, Ion: 1.07 mmol/L — ABNORMAL LOW (ref 1.15–1.40)
HCT: 29 % — ABNORMAL LOW (ref 39.0–52.0)
Hemoglobin: 9.9 g/dL — ABNORMAL LOW (ref 13.0–17.0)
O2 Saturation: 99 %
Potassium: 3.9 mmol/L (ref 3.5–5.1)
Sodium: 142 mmol/L (ref 135–145)
TCO2: 23 mmol/L (ref 22–32)
pCO2 arterial: 35 mmHg (ref 32.0–48.0)
pH, Arterial: 7.414 (ref 7.350–7.450)
pO2, Arterial: 154 mmHg — ABNORMAL HIGH (ref 83.0–108.0)

## 2019-09-03 LAB — GLUCOSE, CAPILLARY
Glucose-Capillary: 120 mg/dL — ABNORMAL HIGH (ref 70–99)
Glucose-Capillary: 136 mg/dL — ABNORMAL HIGH (ref 70–99)
Glucose-Capillary: 103 mg/dL — ABNORMAL HIGH (ref 70–99)
Glucose-Capillary: 123 mg/dL — ABNORMAL HIGH (ref 70–99)

## 2019-09-03 SURGERY — RIGHT/LEFT HEART CATH AND CORONARY ANGIOGRAPHY
Anesthesia: LOCAL

## 2019-09-03 MED ORDER — SODIUM CHLORIDE 0.9% FLUSH
3.0000 mL | Freq: Two times a day (BID) | INTRAVENOUS | Status: DC
  Administered 2019-09-03 – 2019-09-05 (×5): 3 mL via INTRAVENOUS

## 2019-09-03 MED ORDER — ONDANSETRON HCL 4 MG/2ML IJ SOLN: 4.0000 mg | Freq: Four times a day (QID) | INTRAMUSCULAR | Status: DC | PRN

## 2019-09-03 MED ORDER — HYDRALAZINE HCL 20 MG/ML IJ SOLN: 10.0000 mg | INTRAMUSCULAR | Status: AC | PRN

## 2019-09-03 MED ORDER — MIDAZOLAM HCL 2 MG/2ML IJ SOLN
INTRAMUSCULAR | Status: AC
  Filled 2019-09-03: qty 2

## 2019-09-03 MED ORDER — HEPARIN (PORCINE) IN NACL 1000-0.9 UT/500ML-% IV SOLN
INTRAVENOUS | Status: DC | PRN
  Administered 2019-09-03 (×2): 500 mL

## 2019-09-03 MED ORDER — PNEUMOCOCCAL VAC POLYVALENT 25 MCG/0.5ML IJ INJ: 0.5000 mL | INJECTION | INTRAMUSCULAR | Status: DC

## 2019-09-03 MED ORDER — INFLUENZA VAC A&B SA ADJ QUAD 0.5 ML IM PRSY
0.5000 mL | PREFILLED_SYRINGE | INTRAMUSCULAR | Status: DC
  Filled 2019-09-03: qty 0.5

## 2019-09-03 MED ORDER — HEPARIN SODIUM (PORCINE) 1000 UNIT/ML IJ SOLN
INTRAMUSCULAR | Status: DC | PRN
  Administered 2019-09-03: 3500 [IU] via INTRAVENOUS

## 2019-09-03 MED ORDER — SODIUM CHLORIDE 0.9 % IV SOLN: INTRAVENOUS | Status: AC

## 2019-09-03 MED ORDER — HEPARIN SODIUM (PORCINE) 1000 UNIT/ML IJ SOLN
INTRAMUSCULAR | Status: AC
  Filled 2019-09-03: qty 1

## 2019-09-03 MED ORDER — IOHEXOL 350 MG/ML SOLN
INTRAVENOUS | Status: DC | PRN
  Administered 2019-09-03: 40 mL

## 2019-09-03 MED ORDER — ENOXAPARIN SODIUM 40 MG/0.4ML ~~LOC~~ SOLN
40.0000 mg | SUBCUTANEOUS | Status: DC
  Administered 2019-09-04 – 2019-09-05 (×2): 40 mg via SUBCUTANEOUS
  Filled 2019-09-03 (×2): qty 0.4

## 2019-09-03 MED ORDER — LIDOCAINE HCL (PF) 1 % IJ SOLN
INTRAMUSCULAR | Status: AC
  Filled 2019-09-03: qty 30

## 2019-09-03 MED ORDER — SODIUM CHLORIDE 0.9 % IV SOLN: 250.0000 mL | INTRAVENOUS | Status: DC | PRN

## 2019-09-03 MED ORDER — LABETALOL HCL 5 MG/ML IV SOLN: 10.0000 mg | INTRAVENOUS | Status: AC | PRN

## 2019-09-03 MED ORDER — AMIODARONE LOAD VIA INFUSION
150.0000 mg | Freq: Once | INTRAVENOUS | Status: AC
  Administered 2019-09-03: 150 mg via INTRAVENOUS
  Filled 2019-09-03: qty 83.34

## 2019-09-03 MED ORDER — HEPARIN (PORCINE) IN NACL 1000-0.9 UT/500ML-% IV SOLN
INTRAVENOUS | Status: AC
  Filled 2019-09-03: qty 500

## 2019-09-03 MED ORDER — SODIUM CHLORIDE 0.9% FLUSH: 3.0000 mL | INTRAVENOUS | Status: DC | PRN

## 2019-09-03 MED ORDER — VERAPAMIL HCL 2.5 MG/ML IV SOLN
INTRAVENOUS | Status: AC
  Filled 2019-09-03: qty 2

## 2019-09-03 MED ORDER — ACETAMINOPHEN 325 MG PO TABS: 650.0000 mg | ORAL_TABLET | ORAL | Status: DC | PRN

## 2019-09-03 MED ORDER — FENTANYL CITRATE (PF) 100 MCG/2ML IJ SOLN
INTRAMUSCULAR | Status: AC
  Filled 2019-09-03: qty 2

## 2019-09-03 MED ORDER — LIDOCAINE HCL (PF) 1 % IJ SOLN
INTRAMUSCULAR | Status: DC | PRN
  Administered 2019-09-03 (×2): 2 mL

## 2019-09-03 MED ORDER — VERAPAMIL HCL 2.5 MG/ML IV SOLN
INTRAVENOUS | Status: DC | PRN
  Administered 2019-09-03: 10 mL via INTRA_ARTERIAL

## 2019-09-03 SURGICAL SUPPLY — 12 items
CATH BALLN WEDGE 5F 110CM (CATHETERS) ×2 IMPLANT
CATH INFINITI MULTIPACK ANG 4F (CATHETERS) ×2 IMPLANT
DEVICE RAD COMP TR BAND LRG (VASCULAR PRODUCTS) ×2 IMPLANT
GLIDESHEATH SLEND SS 6F .021 (SHEATH) ×2 IMPLANT
GUIDEWIRE INQWIRE 1.5J.035X260 (WIRE) ×1 IMPLANT
INQWIRE 1.5J .035X260CM (WIRE) ×2
KIT HEART LEFT (KITS) ×2 IMPLANT
PACK CARDIAC CATHETERIZATION (CUSTOM PROCEDURE TRAY) ×2 IMPLANT
SHEATH GLIDE SLENDER 4/5FR (SHEATH) ×4 IMPLANT
TRANSDUCER W/STOPCOCK (MISCELLANEOUS) ×2 IMPLANT
TUBING CIL FLEX 10 FLL-RA (TUBING) ×2 IMPLANT
WIRE HI TORQ VERSACORE-J 145CM (WIRE) ×2 IMPLANT

## 2019-09-03 NOTE — Progress Notes (Addendum)
   Called after cath for NSVT and frequent PVCs. Dr Haroldine Laws reviewed.   Given 150 mg amio bolus. Continue amio 30 mg per hour.   Asked EP to give recs for anti-arrhythmics.   Ceclia Koker NP-C  4:38 PM

## 2019-09-03 NOTE — H&P (View-Only) (Signed)
Advanced Heart Failure Rounding Note   Subjective:    Remains on IV amio. No further VT.  Off inotropes. Co-ox not drawn this am   Hypertension much improved with titration of Entresto and Bidil   CVP remains ~4. Deneis SOB, orthopnea or PND    Objective:   Weight Range:  Vital Signs:   Temp:  [97.7 F (36.5 C)-98.9 F (37.2 C)] 97.7 F (36.5 C) (12/21 0816) Pulse Rate:  [63-73] 64 (12/21 0816) Resp:  [15-23] 18 (12/21 0816) BP: (99-145)/(59-80) 121/80 (12/21 0816) SpO2:  [100 %] 100 % (12/21 0848) Weight:  [72.7 kg] 72.7 kg (12/20 1333) Last BM Date: 09/02/19  Weight change: Filed Weights   08/31/19 0530 09/01/19 0500 09/02/19 1333  Weight: 72.6 kg 72.7 kg 72.7 kg    Intake/Output:   Intake/Output Summary (Last 24 hours) at 09/03/2019 0857 Last data filed at 09/03/2019 0400 Gross per 24 hour  Intake 670 ml  Output 1850 ml  Net -1180 ml     Physical Exam: General:  Sitting up in bed . No resp difficulty HEENT: normal Neck: supple. no JVD. Carotids 2+ bilat; no bruits. No lymphadenopathy or thryomegaly appreciated. Cor: PMI nondisplaced. Regular rate & rhythm. 2/6 MR Lungs: clear Abdomen: soft, nontender, nondistended. No hepatosplenomegaly. No bruits or masses. Good bowel sounds. Extremities: no cyanosis, clubbing, rash, edema Neuro: alert & orientedx3, cranial nerves grossly intact. moves all 4 extremities w/o difficulty. Affect pleasant   Telemetry: Sinus 60s personally reviewed.  Labs: Basic Metabolic Panel: Recent Labs  Lab 08/28/19 1919 08/29/19 0451 08/29/19 0802 08/30/19 0341 08/30/19 2324 08/31/19 0311 09/01/19 0248 09/02/19 0509 09/03/19 0451  NA 140  --   --  143 139 141 138 137 137  K 5.2*  --   --  3.0* 3.1* 3.4* 4.6 4.1 4.2  CL 111  --    < > 103  --  100 104 105 104  CO2 11*  --    < > 29  --  32 26 24 25   GLUCOSE 143*  --    < > 130*  --  104* 109* 115* 130*  BUN 33*  --    < > 35*  --  24* 17 12 14   CREATININE 3.32*  --     < > 2.26*  --  1.70* 1.34* 1.20 1.47*  CALCIUM 8.2*  --    < > 7.6*  --  7.9* 8.5* 8.9 8.6*  MG 2.1 1.8  --   --   --   --  1.7  --   --   PHOS 6.3* 7.0*  --   --   --   --   --   --   --    < > = values in this interval not displayed.    Liver Function Tests: Recent Labs  Lab 08/28/19 1919 08/29/19 0059  AST 75* 77*  ALT 51* 63*  ALKPHOS 85 83  BILITOT 1.1 0.9  PROT 6.1* 6.1*  ALBUMIN 3.4* 3.3*   No results for input(s): LIPASE, AMYLASE in the last 168 hours. No results for input(s): AMMONIA in the last 168 hours.  CBC: Recent Labs  Lab 08/28/19 1919 08/30/19 0341 08/30/19 2324 08/31/19 0311 09/01/19 0248 09/02/19 0509 09/03/19 0451  WBC 10.9* 13.9*  --  8.4 7.1 5.5 6.1  NEUTROABS 8.4*  --   --   --   --   --   --   HGB 10.6* 10.2* 9.9*  9.9* 9.5* 10.6* 10.3*  HCT 31.7* 29.8* 29.0* 29.5* 30.0* 33.4* 31.8*  MCV 85.7 86.6  --  87.8 90.4 90.5 89.1  PLT 184 169  --  149* 142* 157 157    Cardiac Enzymes: No results for input(s): CKTOTAL, CKMB, CKMBINDEX, TROPONINI in the last 168 hours.  BNP: BNP (last 3 results) Recent Labs    08/28/19 1919  BNP 1,337.0*    ProBNP (last 3 results) No results for input(s): PROBNP in the last 8760 hours.    Other results:  Imaging: No results found.   Medications:     Scheduled Medications: . [MAR Hold] aspirin  81 mg Oral Daily  . [MAR Hold] Chlorhexidine Gluconate Cloth  6 each Topical Daily  . [MAR Hold] digoxin  0.125 mg Oral Daily  . [MAR Hold] enoxaparin (LOVENOX) injection  40 mg Subcutaneous Q24H  . [MAR Hold] feeding supplement (ENSURE ENLIVE)  237 mL Oral TID BM  . [MAR Hold] insulin aspart  0-9 Units Subcutaneous TID WC  . [MAR Hold] isosorbide-hydrALAZINE  1 tablet Oral TID  . [MAR Hold] multivitamin with minerals  1 tablet Oral Daily  . [MAR Hold] pravastatin  40 mg Oral q1800  . [MAR Hold] sacubitril-valsartan  1 tablet Oral BID  . [MAR Hold] sodium chloride flush  3 mL Intravenous Q12H  . sodium  chloride flush  3 mL Intravenous Q12H  . [MAR Hold] spironolactone  25 mg Oral Daily    Infusions: . [MAR Hold] sodium chloride    . sodium chloride    . sodium chloride    . amiodarone 30 mg/hr (09/03/19 0331)    PRN Medications: [MAR Hold] sodium chloride, sodium chloride, [MAR Hold] acetaminophen, [MAR Hold] guaiFENesin-dextromethorphan, Heparin (Porcine) in NaCl, [MAR Hold] ondansetron (ZOFRAN) IV, [MAR Hold] sodium chloride flush, sodium chloride flush   Assessment/Plan:    1. Acute systolic HF with severe biventricular dysfunction  -> cardiogenic shock - Echo with EF 25% with severe RV dysfunction - Suspect VT due to low EF (not vice versa) - CVP remains low. No co-ox drawn today - Creatinine back up from 1.2 -> 1.47 - Continue digoxin 0.125 mg daily.  - Continue spiro 25 daily  -  Continue Entresto 49/51 bid -  Continue Bidil 1 tab tid - may need to cut back meds a bit to avoid hypotension  - R/L cath today   2. VT  - failed DC-CV in ER on 12/15 - (only on ASA/plavix at home) will need full AC - On amio gtt. VT quiescent  - Cath today - EP following for ICD vs LifeVest - Keep K> 4.0 Mg > 2.0  3. CAD - details unclear.  - currently stable.  - troponins ok despite marked hemodynamic stress - R/L cath today to assess for ischemic cause of VT. We discussed procedure - ASA/statin   4. AKI on CKD3 - baseline creatinine 1.7  - Creatinine peaked 3.45 . Todays creatinine 1.20 -> 1.40. Avoid hypotension  - due to ATN from shock  5. DM2 - cover with SSI - hgb a1c 7.0  6. Smell of kerosene - carboxyhgb ok   7. Acute hypoxic respiratory failure -Initially on Bipap. Stable oxygen saturations on room air.   8. Hypokalemia:  - K 4.2 today  9. Hypomag - Mg supped yesterday    Length of Stay: 6   Glori Bickers MD 09/03/2019, 8:57 AM  Advanced Heart Failure Team Pager [phone] (M-F; 7a - 4p)  Please contact [facility] for night-coverage  after hours (4p -7a ) and w   

## 2019-09-03 NOTE — Interval H&P Note (Signed)
History and Physical Interval Note:  09/03/2019 9:02 AM  [name] [name]  has presented today for surgery, with the diagnosis of cardiogenic shock.  The various methods of treatment have been discussed with the patient and family. After consideration of risks, benefits and other options for treatment, the patient has consented to  Procedure(s): RIGHT/LEFT HEART CATH AND CORONARY ANGIOGRAPHY (N/A) and possible coronary angioplasty as a surgical intervention.  The patient's history has been reviewed, patient examined, no change in status, stable for surgery.  I have reviewed the patient's chart and labs.  Questions were answered to the patient's satisfaction.     Jorge Retz

## 2019-09-03 NOTE — Progress Notes (Signed)
Advanced Heart Failure Rounding Note   Subjective:    Remains on IV amio. No further VT.  Off inotropes. Co-ox not drawn this am   Hypertension much improved with titration of Entresto and Bidil   CVP remains ~4. Deneis SOB, orthopnea or PND    Objective:   Weight Range:  Vital Signs:   Temp:  [97.7 F (36.5 C)-98.9 F (37.2 C)] 97.7 F (36.5 C) (12/21 0816) Pulse Rate:  [63-73] 64 (12/21 0816) Resp:  [15-23] 18 (12/21 0816) BP: (99-145)/(59-80) 121/80 (12/21 0816) SpO2:  [100 %] 100 % (12/21 0848) Weight:  [72.7 kg] 72.7 kg (12/20 1333) Last BM Date: 09/02/19  Weight change: Filed Weights   08/31/19 0530 09/01/19 0500 09/02/19 1333  Weight: 72.6 kg 72.7 kg 72.7 kg    Intake/Output:   Intake/Output Summary (Last 24 hours) at 09/03/2019 0857 Last data filed at 09/03/2019 0400 Gross per 24 hour  Intake 670 ml  Output 1850 ml  Net -1180 ml     Physical Exam: General:  Sitting up in bed . No resp difficulty HEENT: normal Neck: supple. no JVD. Carotids 2+ bilat; no bruits. No lymphadenopathy or thryomegaly appreciated. Cor: PMI nondisplaced. Regular rate & rhythm. 2/6 MR Lungs: clear Abdomen: soft, nontender, nondistended. No hepatosplenomegaly. No bruits or masses. Good bowel sounds. Extremities: no cyanosis, clubbing, rash, edema Neuro: alert & orientedx3, cranial nerves grossly intact. moves all 4 extremities w/o difficulty. Affect pleasant   Telemetry: Sinus 60s personally reviewed.  Labs: Basic Metabolic Panel: Recent Labs  Lab 08/28/19 1919 08/29/19 0451 08/29/19 0802 08/30/19 0341 08/30/19 2324 08/31/19 0311 09/01/19 0248 09/02/19 0509 09/03/19 0451  NA 140  --   --  143 139 141 138 137 137  K 5.2*  --   --  3.0* 3.1* 3.4* 4.6 4.1 4.2  CL 111  --    < > 103  --  100 104 105 104  CO2 11*  --    < > 29  --  32 26 24 25   GLUCOSE 143*  --    < > 130*  --  104* 109* 115* 130*  BUN 33*  --    < > 35*  --  24* 17 12 14   CREATININE 3.32*  --     < > 2.26*  --  1.70* 1.34* 1.20 1.47*  CALCIUM 8.2*  --    < > 7.6*  --  7.9* 8.5* 8.9 8.6*  MG 2.1 1.8  --   --   --   --  1.7  --   --   PHOS 6.3* 7.0*  --   --   --   --   --   --   --    < > = values in this interval not displayed.    Liver Function Tests: Recent Labs  Lab 08/28/19 1919 08/29/19 0059  AST 75* 77*  ALT 51* 63*  ALKPHOS 85 83  BILITOT 1.1 0.9  PROT 6.1* 6.1*  ALBUMIN 3.4* 3.3*   No results for input(s): LIPASE, AMYLASE in the last 168 hours. No results for input(s): AMMONIA in the last 168 hours.  CBC: Recent Labs  Lab 08/28/19 1919 08/30/19 0341 08/30/19 2324 08/31/19 0311 09/01/19 0248 09/02/19 0509 09/03/19 0451  WBC 10.9* 13.9*  --  8.4 7.1 5.5 6.1  NEUTROABS 8.4*  --   --   --   --   --   --   HGB 10.6* 10.2* 9.9*  9.9* 9.5* 10.6* 10.3*  HCT 31.7* 29.8* 29.0* 29.5* 30.0* 33.4* 31.8*  MCV 85.7 86.6  --  87.8 90.4 90.5 89.1  PLT 184 169  --  149* 142* 157 157    Cardiac Enzymes: No results for input(s): CKTOTAL, CKMB, CKMBINDEX, TROPONINI in the last 168 hours.  BNP: BNP (last 3 results) Recent Labs    08/28/19 1919  BNP 1,337.0*    ProBNP (last 3 results) No results for input(s): PROBNP in the last 8760 hours.    Other results:  Imaging: No results found.   Medications:     Scheduled Medications: . [MAR Hold] aspirin  81 mg Oral Daily  . [MAR Hold] Chlorhexidine Gluconate Cloth  6 each Topical Daily  . [MAR Hold] digoxin  0.125 mg Oral Daily  . [MAR Hold] enoxaparin (LOVENOX) injection  40 mg Subcutaneous Q24H  . [MAR Hold] feeding supplement (ENSURE ENLIVE)  237 mL Oral TID BM  . [MAR Hold] insulin aspart  0-9 Units Subcutaneous TID WC  . [MAR Hold] isosorbide-hydrALAZINE  1 tablet Oral TID  . [MAR Hold] multivitamin with minerals  1 tablet Oral Daily  . [MAR Hold] pravastatin  40 mg Oral q1800  . [MAR Hold] sacubitril-valsartan  1 tablet Oral BID  . [MAR Hold] sodium chloride flush  3 mL Intravenous Q12H  . sodium  chloride flush  3 mL Intravenous Q12H  . [MAR Hold] spironolactone  25 mg Oral Daily    Infusions: . [MAR Hold] sodium chloride    . sodium chloride    . sodium chloride    . amiodarone 30 mg/hr (09/03/19 0331)    PRN Medications: [MAR Hold] sodium chloride, sodium chloride, [MAR Hold] acetaminophen, [MAR Hold] guaiFENesin-dextromethorphan, Heparin (Porcine) in NaCl, [MAR Hold] ondansetron (ZOFRAN) IV, [MAR Hold] sodium chloride flush, sodium chloride flush   Assessment/Plan:    1. Acute systolic HF with severe biventricular dysfunction  -> cardiogenic shock - Echo with EF 25% with severe RV dysfunction - Suspect VT due to low EF (not vice versa) - CVP remains low. No co-ox drawn today - Creatinine back up from 1.2 -> 1.47 - Continue digoxin 0.125 mg daily.  - Continue spiro 25 daily  -  Continue Entresto 49/51 bid -  Continue Bidil 1 tab tid - may need to cut back meds a bit to avoid hypotension  - R/L cath today   2. VT  - failed DC-CV in ER on 12/15 - (only on ASA/plavix at home) will need full AC - On amio gtt. VT quiescent  - Cath today - EP following for ICD vs LifeVest - Keep K> 4.0 Mg > 2.0  3. CAD - details unclear.  - currently stable.  - troponins ok despite marked hemodynamic stress - R/L cath today to assess for ischemic cause of VT. We discussed procedure - ASA/statin   4. AKI on CKD3 - baseline creatinine 1.7  - Creatinine peaked 3.45 . Todays creatinine 1.20 -> 1.40. Avoid hypotension  - due to ATN from shock  5. DM2 - cover with SSI - hgb a1c 7.0  6. Smell of kerosene - carboxyhgb ok   7. Acute hypoxic respiratory failure -Initially on Bipap. Stable oxygen saturations on room air.   8. Hypokalemia:  - K 4.2 today  9. Hypomag - Mg supped yesterday    Length of Stay: 6   Glori Bickers MD 09/03/2019, 8:57 AM  Advanced Heart Failure Team Pager [phone] (M-F; 7a - 4p)  Please contact [facility] for night-coverage  after hours (4p -7a ) and w

## 2019-09-04 LAB — MAGNESIUM: Magnesium: 2.2 mg/dL (ref 1.7–2.4)

## 2019-09-04 LAB — BASIC METABOLIC PANEL
Anion gap: 10 (ref 5–15)
BUN: 14 mg/dL (ref 8–23)
CO2: 24 mmol/L (ref 22–32)
Calcium: 8.4 mg/dL — ABNORMAL LOW (ref 8.9–10.3)
Chloride: 105 mmol/L (ref 98–111)
Creatinine, Ser: 1.74 mg/dL — ABNORMAL HIGH (ref 0.61–1.24)
GFR calc Af Amer: 41 mL/min — ABNORMAL LOW (ref >60)
GFR calc non Af Amer: 36 mL/min — ABNORMAL LOW (ref >60)
Glucose, Bld: 124 mg/dL — ABNORMAL HIGH (ref 70–99)
Potassium: 4 mmol/L (ref 3.5–5.1)
Sodium: 139 mmol/L (ref 135–145)

## 2019-09-04 LAB — CBC
HCT: 30.7 % — ABNORMAL LOW (ref 39.0–52.0)
Hemoglobin: 10.1 g/dL — ABNORMAL LOW (ref 13.0–17.0)
MCH: 29.2 pg (ref 26.0–34.0)
MCHC: 32.9 g/dL (ref 30.0–36.0)
MCV: 88.7 fL (ref 80.0–100.0)
Platelets: 153 10*3/uL (ref 150–400)
RBC: 3.46 MIL/uL — ABNORMAL LOW (ref 4.22–5.81)
RDW: 16.1 % — ABNORMAL HIGH (ref 11.5–15.5)
WBC: 7.3 10*3/uL (ref 4.0–10.5)
nRBC: 0 % (ref 0.0–0.2)

## 2019-09-04 LAB — GLUCOSE, CAPILLARY
Glucose-Capillary: 115 mg/dL — ABNORMAL HIGH (ref 70–99)
Glucose-Capillary: 106 mg/dL — ABNORMAL HIGH (ref 70–99)
Glucose-Capillary: 127 mg/dL — ABNORMAL HIGH (ref 70–99)
Glucose-Capillary: 93 mg/dL (ref 70–99)

## 2019-09-04 LAB — DIGOXIN LEVEL: Digoxin Level: 0.7 ng/mL — ABNORMAL LOW (ref 0.8–2.0)

## 2019-09-04 MED ORDER — MEXILETINE HCL 150 MG PO CAPS
300.0000 mg | ORAL_CAPSULE | Freq: Two times a day (BID) | ORAL | Status: DC
  Administered 2019-09-04 – 2019-09-06 (×5): 300 mg via ORAL
  Filled 2019-09-04 (×5): qty 2

## 2019-09-04 MED ORDER — ATORVASTATIN CALCIUM 40 MG PO TABS
40.0000 mg | ORAL_TABLET | Freq: Every day | ORAL | Status: DC
  Administered 2019-09-04 – 2019-09-05 (×2): 40 mg via ORAL
  Filled 2019-09-04 (×2): qty 1

## 2019-09-04 MED ORDER — DIGOXIN 125 MCG PO TABS
0.0625 mg | ORAL_TABLET | Freq: Every day | ORAL | Status: DC
  Administered 2019-09-05: 0.0625 mg via ORAL
  Filled 2019-09-04: qty 1

## 2019-09-04 NOTE — Progress Notes (Signed)
Nutrition Follow-up  DOCUMENTATION CODES:   Non-severe (moderate) malnutrition in context of chronic illness  INTERVENTION:   - Continue Ensure Enlive po TID, each supplement provides 350 kcal and 20 grams of protein  - Continue MVI with minerals daily  - Will attache "Low Sodium Nutrition Therapy" diet education handout from the Academy of [employer]etics to p'ts AVS/Discharge Instructions  NUTRITION DIAGNOSIS:   Moderate Malnutrition related to chronic illness (CHF) as evidenced by moderate fat depletion, moderate muscle depletion, severe muscle depletion.  Ongoing, being addressed via oral nutrition supplements  GOAL:   Patient will meet greater than or equal to 90% of their needs  Progressing  MONITOR:   PO intake, Supplement acceptance, Weight trends, Labs, I & O's  REASON FOR ASSESSMENT:   Malnutrition Screening Tool    ASSESSMENT:   Patient with PMH significant for DM, HTN, HLD, CHF, CAD, chronic AF, and CKD III. Presents this admission with acute exacerbation CHF.  12/21 - s/p R/L cath  Noted pt will have a Life Vest at discharge.  Weight down 10 lbs since admit. Pt is net negative 3.5 L.  Spoke with pt via phone call to room. Pt reports his appetite is "alright" and that it "depends on what they bring me." Most meal completions recorded as 100%.  Pt reports he likes the Ensure [place] and is drinking at least 2 a day. RD encouraged continued adequate PO intake and acceptance of supplements.  RD will add low sodium diet education to AVS/Discharge Instructions.  Per RN edema assessment, pt with mild pitting generalized edema and mild pitting edema to BLE.  Meal Completion: 30-100% x last 8 meals (averaging 88%)  Medications reviewed and include: Ensure Enlive TID, SSI, MVI with minerals, spironolactone, amiodarone  Labs reviewed. CBG's: 103-123 x 24 hours  UOP: 750 ml x 24 hours I/O's: -3.5 L since admit  Diet Order:   Diet Order             Diet Heart Room service appropriate? Yes; Fluid consistency: Thin  Diet effective now              EDUCATION NEEDS:   Education needs have been addressed  Skin:  Skin Assessment: Reviewed RN Assessment  Last BM:  09/03/19 small type 7  Height:   Ht Readings from Last 1 Encounters:  08/29/19 5\' 11"  (1.803 m)    Weight:   Wt Readings from Last 1 Encounters:  09/04/19 69.9 kg    Ideal Body Weight:  78.2 kg  BMI:  Body mass index is 21.49 kg/m.  Estimated Nutritional Needs:   Kcal:  2200-2400 kcal  Protein:  110-130 grams  Fluid:  >/= 2 L/day    Gaynell Face, MS, RD, LDN Inpatient Clinical Dietitian Pager: [phone] Weekend/After Hours: [phone]

## 2019-09-04 NOTE — Progress Notes (Addendum)
Advanced Heart Failure Rounding Note   Subjective:    Yesterday had LHC with nonobstructive CAD.   LHC 09/04/19  1. Non-obstructive CAD 2. Severe NICM with EF 25% by echo 3. VT and frequent PVCs 4. Well-compensated hemodynamics   Denies SOB. Denies palpitations.    Objective:   Weight Range:  Vital Signs:   Temp:  [97.1 F (36.2 C)-98.6 F (37 C)] 97.8 F (36.6 C) (12/22 1059) Pulse Rate:  [60-87] 87 (12/22 1059) Resp:  [13-24] 18 (12/22 1059) BP: (109-141)/(49-71) 136/66 (12/22 1059) SpO2:  [96 %-100 %] 98 % (12/22 1059) Weight:  [69.9 kg] 69.9 kg (12/22 0516) Last BM Date: 09/02/19  Weight change: Filed Weights   09/01/19 0500 09/02/19 1333 09/04/19 0516  Weight: 72.7 kg 72.7 kg 69.9 kg    Intake/Output:   Intake/Output Summary (Last 24 hours) at 09/04/2019 1158 Last data filed at 09/04/2019 0516 Gross per 24 hour  Intake 692.84 ml  Output 750 ml  Net -57.16 ml     Physical Exam: General:  Elderlt Frail appearing. No resp difficulty HEENT: normal Neck: supple. no JVD. Carotids 2+ bilat; no bruits. No lymphadenopathy or thryomegaly appreciated. Cor: PMI nondisplaced. Regular rate & rhythm. 2/6 MR Lungs: clear no wheeze Abdomen: soft, nontender, nondistended. No hepatosplenomegaly. No bruits or masses. Good bowel sounds. Extremities: no cyanosis, clubbing, rash, edema Neuro: alert & oriented x 3, cranial nerves grossly intact. moves all 4 extremities w/o difficulty. Affect pleasant   Telemetry: SR with PVCs and NSVT Personally reviewed   Labs: Basic Metabolic Panel: Recent Labs  Lab 08/28/19 1919 08/29/19 0451 08/29/19 0802 08/31/19 7628 09/01/19 0248 09/02/19 3151 09/03/19 0451 09/03/19 0944 09/03/19 0947 09/03/19 0951 09/04/19 0500  NA 140  --   --  141 138 137 137 142 139  140 139 139  K 5.2*  --   --  3.4* 4.6 4.1 4.2 3.9 4.2  4.1 4.2 4.0  CL 111  --    < > 100 104 105 104  --   --   --  105  CO2 11*  --    < > 32 26 24 25   --    --   --  24  GLUCOSE 143*  --    < > 104* 109* 115* 130*  --   --   --  124*  BUN 33*  --    < > 24* 17 12 14   --   --   --  14  CREATININE 3.32*  --    < > 1.70* 1.34* 1.20 1.47*  --   --   --  1.74*  CALCIUM 8.2*  --    < > 7.9* 8.5* 8.9 8.6*  --   --   --  8.4*  MG 2.1 1.8  --   --  1.7  --   --   --   --   --  2.2  PHOS 6.3* 7.0*  --   --   --   --   --   --   --   --   --    < > = values in this interval not displayed.    Liver Function Tests: Recent Labs  Lab 08/28/19 1919 08/29/19 0059  AST 75* 77*  ALT 51* 63*  ALKPHOS 85 83  BILITOT 1.1 0.9  PROT 6.1* 6.1*  ALBUMIN 3.4* 3.3*   No results for input(s): LIPASE, AMYLASE in the last 168 hours. No results  for input(s): AMMONIA in the last 168 hours.  CBC: Recent Labs  Lab 08/28/19 1919 08/31/19 0311 09/01/19 0248 09/02/19 0509 09/03/19 0451 09/03/19 0944 09/03/19 0947 09/03/19 0951 09/04/19 0500  WBC 10.9* 8.4 7.1 5.5 6.1  --   --   --  7.3  NEUTROABS 8.4*  --   --   --   --   --   --   --   --   HGB 10.6* 9.9* 9.5* 10.6* 10.3* 9.9* 10.2*  10.5* 10.5* 10.1*  HCT 31.7* 29.5* 30.0* 33.4* 31.8* 29.0* 30.0*  31.0* 31.0* 30.7*  MCV 85.7 87.8 90.4 90.5 89.1  --   --   --  88.7  PLT 184 149* 142* 157 157  --   --   --  153    Cardiac Enzymes: No results for input(s): CKTOTAL, CKMB, CKMBINDEX, TROPONINI in the last 168 hours.  BNP: BNP (last 3 results) Recent Labs    08/28/19 1919  BNP 1,337.0*    ProBNP (last 3 results) No results for input(s): PROBNP in the last 8760 hours.    Other results:  Imaging: CARDIAC CATHETERIZATION  Result Date: 09/03/2019  Ost LAD to Prox LAD lesion is 40% stenosed.  2nd Diag lesion is 60% stenosed.  Dist Cx lesion is 70% stenosed.  1st Mrg lesion is 30% stenosed.  Ost RCA to Prox RCA lesion is 30% stenosed.  RPAV lesion is 40% stenosed.  3rd RPL lesion is 60% stenosed.  Findings: Ao = 138/47 (72) LV = 136/2 RA =  4 RV = 29/1 PA = 33/4 (11) PCW = 10 (v = 25) Fick  cardiac output/index = 5.7/3.0 PVR = < 1.0 WU Ao sat = 99% PA sat = 66%, 67% Assessment: 1. Non-obstructive CAD 2. Severe NICM with EF 25% by echo 3. VT and frequent PVCs 4. 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 PVCs not completely suppressed with amio. May need mexilitene. Will d/w EP regarding AA therapy as well as potential ICD vs LifeVest. Arvilla Meres, MD 10:06 AM     Medications:     Scheduled Medications: . aspirin  81 mg Oral Daily  . Chlorhexidine Gluconate Cloth  6 each Topical Daily  . digoxin  0.125 mg Oral Daily  . enoxaparin (LOVENOX) injection  40 mg Subcutaneous Q24H  . feeding supplement (ENSURE ENLIVE)  237 mL Oral TID BM  . influenza vaccine adjuvanted  0.5 mL Intramuscular Tomorrow-1000  . insulin aspart  0-9 Units Subcutaneous TID WC  . isosorbide-hydrALAZINE  1 tablet Oral TID  . mexiletine  300 mg Oral Q12H  . multivitamin with minerals  1 tablet Oral Daily  . pneumococcal 23 valent vaccine  0.5 mL Intramuscular Tomorrow-1000  . pravastatin  40 mg Oral q1800  . sacubitril-valsartan  1 tablet Oral BID  . sodium chloride flush  3 mL Intravenous Q12H  . sodium chloride flush  3 mL Intravenous Q12H  . spironolactone  25 mg Oral Daily    Infusions: . sodium chloride    . sodium chloride    . amiodarone 30 mg/hr (09/04/19 0800)    PRN Medications: sodium chloride, sodium chloride, acetaminophen, guaiFENesin-dextromethorphan, ondansetron (ZOFRAN) IV, sodium chloride flush, sodium chloride flush   Assessment/Plan:    1. Acute systolic HF with severe biventricular dysfunction  -> cardiogenic shock - Echo with EF 25% with severe RV dysfunction - Suspect VT due to low EF (not vice versa) - Creatinine 1.7 today but had LHC yesterday.  BMET  - Dig level .8.  Cut back digoxin 0.0625 mg daily.   - Continue spiro 25 daily  -  Continue Entresto 49/51 bid -  Continue Bidil 1 tab tid - Will check with insurance for  entresto/bidil coverage.   2. VT  - failed DC-CV in ER on 12/15 - (only on ASA/plavix at home) will need full AC - On amio gtt.  - EP following --> Recommendations for Life Vest. Continue amio and mexiletine added today.  - Life Vest has been ordered. - Keep K> 4.0 Mg > 2.0  3. CAD - troponins ok despite marked hemodynamic stress - LHC 09/03/19 Nonobstructive CAD.  - Continue ASA - Stop pravastatin. Start atorvastatin 40 mg daily.   4. AKI on CKD3 - baseline creatinine 1.7  - Creatinine peaked 3.45 due to ATN from shock . Todays creatinine 1.7 .  - Check BMET in am.   5. DM2 - cover with SSI - hgb a1c 7.0  6. Smell of kerosene - carboxyhgb ok   7. Acute hypoxic respiratory failure -Initially on Bipap. Stable oxygen saturations on room air.   8. Hypokalemia:  - K stable today.   9. Hypomag - Stable.    Life Vest has been requested. Check on insurance coverage for mexiletine, amio, bidil, and entresto.    Consult cardiac rehab.  Length of Stay: 7   Amy Clegg NP-C  09/04/2019, 11:58 AM  Advanced Heart Failure Team Pager [phone] (M-F; 7a - 4p)  Please contact [facility] for night-coverage after hours (4p -7a ) and w  Patient seen and examined with the above-signed Advanced Practice Provider and/or Housestaff. I personally reviewed laboratory data, imaging studies and relevant notes. I independently examined the patient and formulated the important aspects of the plan. I have edited the note to reflect any of my changes or salient points. I have personally discussed the plan with the patient and/or family.  Cath results reviewed with him. Volume status ok. HF improved. Still with frequent PVCc and NSVT despite amio. EP has seena nd started mexilitene. Recommend LifeVest.   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 Will continue to follow for PVC suppression. LifeVest ordered.   Continue current HF meds. Watch renal function  closely.   Arvilla Meres, MD  4:02 PM

## 2019-09-04 NOTE — Evaluation (Signed)
Clinical/Bedside Swallow Evaluation Patient Details  Name: [name] [name] MRN: [id number] Date of Birth: [birth year]  Today's Date: 09/04/2019 Time: SLP Start Time (ACUTE ONLY): 1612 SLP Stop Time (ACUTE ONLY): 1624 SLP Time Calculation (min) (ACUTE ONLY): 12 min  Past Medical History:  Past Medical History:  Diagnosis Date  . Arthritis    shoulder  . Asthma   . Atrial fibrillation (HCC)   . Chronic kidney disease    stage III  . COPD (chronic obstructive pulmonary disease) (HCC)   . Coronary artery disease   . Diabetes mellitus without complication (HCC)    type 2  . Dysrhythmia    [facility] HEART IRREGULARLY IRREGULAR  . Full dentures    upper and lower  . Hypercholesteremia   . Hypertension   . Myocardial infarction (HCC)   . Shortness of breath dyspnea   . Swelling    FEET AND LEGS   Past Surgical History:  Past Surgical History:  Procedure Laterality Date  . CARDIAC CATHETERIZATION    . CATARACT EXTRACTION W/PHACO Right 01/28/2016   Procedure: CATARACT EXTRACTION PHACO AND INTRAOCULAR LENS PLACEMENT (IOC) right eye;  Surgeon: Lockie Mola, MD;  Location: [facility];  Service: Ophthalmology;  Laterality: Right;  DIABETIC-oral med MALYUGIN  . RETINAL DETACHMENT SURGERY    . RIGHT/LEFT HEART CATH AND CORONARY ANGIOGRAPHY N/A 09/03/2019   Procedure: RIGHT/LEFT HEART CATH AND CORONARY ANGIOGRAPHY;  Surgeon: Dolores Patty, MD;  Location: [facility];  Service: Cardiovascular;  Laterality: N/A;   HPI:  84 y.o. male with a history of DM2, HTN, HL, COPD, CAD, Chronic AF, and CKD 3 who was transferred from [facility] for further management of cardiogenic shock in setting AFL/tach with RVR. On BiPAP 12/16, improved on 2 liters [place] by 12/17.  Per RN, pt had difficulty swallowing meds 12/16, hence swallow evaluation ordered.  Pt found to have normal swallow.  New swallow orders received 12/20; pt in surgery 12/21.     Assessment / Plan /  Recommendation Clinical Impression  Pt continues to present with normal swallow with no concerns for dysphagia nor aspiration.  Mastication of solids is WNL; there is a consistent, swift swallow response.  No coughing, no concerns for aspiration. Breath sounds have been stable. Pt reports having difficulty swallowing his pills, which he breaks or crushes at home.  No indication of deterioration in swallow since 12/17 assessment.  No further SLP f/u needed; continue current diet.  SLP Visit Diagnosis: Dysphagia, unspecified (R13.10)    Aspiration Risk  No limitations    Diet Recommendation   regular solids, thin liquids  Medication Administration: Crushed with puree    Other  Recommendations Oral Care Recommendations: Oral care BID   Follow up Recommendations    n/a    Frequency and Duration            Prognosis        Swallow Study   General Date of Onset: 08/29/19 HPI: 84 y.o. male with a history of DM2, HTN, HL, COPD, CAD, Chronic AF, and CKD 3 who was transferred from [facility] for further management of cardiogenic shock in setting AFL/tach with RVR. On BiPAP 12/16, improved on 2 liters [place] by 12/17.  Per RN, pt had difficulty swallowing meds 12/16, hence swallow evaluation ordered.  Pt found to have normal swallow.  New swallow orders received 12/20; pt in surgery 12/21.   Type of Study: Bedside Swallow Evaluation Previous Swallow Assessment: (yes ) Diet Prior to  this Study: Regular;Thin liquids Temperature Spikes Noted: No Respiratory Status: Room air History of Recent Intubation: No Behavior/Cognition: Alert;Cooperative;Pleasant mood Oral Cavity Assessment: Within Functional Limits Oral Care Completed by SLP: No Oral Cavity - Dentition: Adequate natural dentition Vision: Functional for self-feeding Self-Feeding Abilities: Able to feed self Patient Positioning: Upright in chair Baseline Vocal Quality: Normal Volitional Cough: Strong Volitional Swallow: Able to elicit     Oral/Motor/Sensory Function Overall Oral Motor/Sensory Function: Within functional limits   Ice Chips Ice chips: Within functional limits   Thin Liquid Thin Liquid: Within functional limits    Nectar Thick Nectar Thick Liquid: Not tested   Honey Thick Honey Thick Liquid: Not tested   Puree Puree: Within functional limits   Solid     Solid: Within functional limits      Juan Quam Laurice 09/04/2019,4:26 PM   Estill Bamberg L. Tivis Ringer, [place] Office number [phone] Pager [phone]

## 2019-09-04 NOTE — Progress Notes (Signed)
CARDIAC REHAB PHASE I   PRE:  Rate/Rhythm: 62 SR  BP:  Supine:   Sitting: 114/63  Standing:    SaO2: 98%RA  MODE:  Ambulation: 400 ft   POST:  Rate/Rhythm: 73 SR  BP:  Supine:   Sitting: 125/63  Standing:    SaO2: 97%RA 1255-1345 Pt ready to walk. Pt walked 400 ft on RA with rolling walker and asst x 1. Gait steady with walker. To recliner with call bell. Tolerated well.    Graylon Good, RN BSN  09/04/2019 1:40 PM

## 2019-09-04 NOTE — Care Management (Signed)
Per Georgana Curio. And Junior J..W/[facility] help desk.[place] amount for Mexiletine150mg -200mg - 250mg . 3 times a day for 30 day supply $.72 retail mail order 90 day supply $1.30.   Co-pay for Bidil (isosorbide-hydralazine) 20 - 37.5 mg 3 times a day 30 day supply $3.90.  Co-pay amount for Entresto 49-51 mg bid for a 30 day supply $3.90.  No PA required No deductible Tier 3 medication Retail Pharmacy : any pharmacy. Mail order pharmacy [facility] Ref.# [id number].

## 2019-09-04 NOTE — Progress Notes (Addendum)
Progress Note  Patient Name: [name] [name] Date of Encounter: 09/04/2019  Primary Cardiologist: No primary care provider on file.   Subjective   Feels "OK", eating breakfast, talking comfortably  Inpatient Medications    Scheduled Meds: . aspirin  81 mg Oral Daily  . Chlorhexidine Gluconate Cloth  6 each Topical Daily  . digoxin  0.125 mg Oral Daily  . enoxaparin (LOVENOX) injection  40 mg Subcutaneous Q24H  . feeding supplement (ENSURE ENLIVE)  237 mL Oral TID BM  . influenza vaccine adjuvanted  0.5 mL Intramuscular Tomorrow-1000  . insulin aspart  0-9 Units Subcutaneous TID WC  . isosorbide-hydrALAZINE  1 tablet Oral TID  . mexiletine  300 mg Oral Q12H  . multivitamin with minerals  1 tablet Oral Daily  . pneumococcal 23 valent vaccine  0.5 mL Intramuscular Tomorrow-1000  . pravastatin  40 mg Oral q1800  . sacubitril-valsartan  1 tablet Oral BID  . sodium chloride flush  3 mL Intravenous Q12H  . sodium chloride flush  3 mL Intravenous Q12H  . spironolactone  25 mg Oral Daily   Continuous Infusions: . sodium chloride    . sodium chloride    . amiodarone 30 mg/hr (09/04/19 0800)   PRN Meds: sodium chloride, sodium chloride, acetaminophen, guaiFENesin-dextromethorphan, ondansetron (ZOFRAN) IV, sodium chloride flush, sodium chloride flush   Vital Signs    Vitals:   09/03/19 2334 09/04/19 0359 09/04/19 0516 09/04/19 0714  BP: (!) 128/59 (!) 123/49  138/61  Pulse: 64 64  71  Resp: 18 13  (!) 24  Temp: 98.3 F (36.8 C) (!) 97.1 F (36.2 C)  98.6 F (37 C)  TempSrc: Oral Oral  Oral  SpO2: 98% 96%  100%  Weight:   69.9 kg   Height:        Intake/Output Summary (Last 24 hours) at 09/04/2019 0823 Last data filed at 09/04/2019 0516 Gross per 24 hour  Intake 692.84 ml  Output 750 ml  Net -57.16 ml   Last 3 Weights 09/04/2019 09/02/2019 09/01/2019  Weight (lbs) 154 lb 1.6 oz 160 lb 4.4 oz 160 lb 4.4 oz  Weight (kg) 69.9 kg 72.7 kg 72.7 kg      Telemetry     SR 60's, PVCs intermittently are frequent with periods of recurrent NSVT 3-10 beats- Personally Reviewed  ECG    No new EKGs - Personally Reviewed  Physical Exam   GEN: No acute distress.   Neck: central line L Cardiac: irregular, no murmurs, rubs, or gallops.  Respiratory: CTA b/l. GI: Soft, nontender, non-distended  MS: No edema; thin, age appropriate atrophy Neuro:  Nonfocal  Psych: Normal affect   Labs    High Sensitivity Troponin:   Recent Labs  Lab 08/28/19 1919 08/28/19 2126 08/29/19 0059  TROPONINIHS 35* 40* 65*      Chemistry Recent Labs  Lab 08/28/19 1919 08/29/19 0059 08/29/19 0109 09/02/19 0509 09/03/19 0451 09/03/19 0947 09/03/19 0951 09/04/19 0500  NA 140 140  --  137 137 139  140 139 139  K 5.2* 5.1  --  4.1 4.2 4.2  4.1 4.2 4.0  CL 111 109   < > 105 104  --   --  105  CO2 11* 13*   < > 24 25  --   --  24  GLUCOSE 143* 217*   < > 115* 130*  --   --  124*  BUN 33* 35*   < > 12 14  --   --  14  CREATININE 3.32* 3.42*   < > 1.20 1.47*  --   --  1.74*  CALCIUM 8.2* 8.6*   < > 8.9 8.6*  --   --  8.4*  PROT 6.1* 6.1*  --   --   --   --   --   --   ALBUMIN 3.4* 3.3*  --   --   --   --   --   --   AST 75* 77*  --   --   --   --   --   --   ALT 51* 63*  --   --   --   --   --   --   ALKPHOS 85 83  --   --   --   --   --   --   BILITOT 1.1 0.9  --   --   --   --   --   --   GFRNONAA 16* 16*   < > 56* 44*  --   --  36*  GFRAA 19* 18*   < > >60 51*  --   --  41*  ANIONGAP 18* 18*   < > 8 8  --   --  10   < > = values in this interval not displayed.     Hematology Recent Labs  Lab 09/02/19 0509 09/03/19 0451 09/03/19 0947 09/03/19 0951 09/04/19 0500  WBC 5.5 6.1  --   --  7.3  RBC 3.69* 3.57*  --   --  3.46*  HGB 10.6* 10.3* 10.2*  10.5* 10.5* 10.1*  HCT 33.4* 31.8* 30.0*  31.0* 31.0* 30.7*  MCV 90.5 89.1  --   --  88.7  MCH 28.7 28.9  --   --  29.2  MCHC 31.7 32.4  --   --  32.9  RDW 16.1* 15.9*  --   --  16.1*  PLT 157 157  --    --  153    BNP Recent Labs  Lab 08/28/19 1919  BNP 1,337.0*     DDimer No results for input(s): DDIMER in the last 168 hours.   Radiology    CARDIAC CATHETERIZATION  Result Date: 09/03/2019  Ost LAD to Prox LAD lesion is 40% stenosed.  2nd Diag lesion is 60% stenosed.  Dist Cx lesion is 70% stenosed.  1st Mrg lesion is 30% stenosed.  Ost RCA to Prox RCA lesion is 30% stenosed.  RPAV lesion is 40% stenosed.  3rd RPL lesion is 60% stenosed.  Findings: Ao = 138/47 (72) LV = 136/2 RA =  4 RV = 29/1 PA = 33/4 (11) PCW = 10 (v = 25) Fick cardiac output/index = 5.7/3.0 PVR = < 1.0 WU Ao sat = 99% PA sat = 66%, 67% Assessment: 1. Non-obstructive CAD 2. Severe NICM with EF 25% by echo 3. VT and frequent PVCs 4. 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 PVCs not completely suppressed with amio. May need mexilitene. [name] d/w EP regarding AA therapy as well as potential ICD vs LifeVest. [name], MD 10:06 AM    Cardiac Studies    09/03/2019: R/LHC  Ost LAD to Prox LAD lesion is 40% stenosed.  2nd Diag lesion is 60% stenosed.  Dist Cx lesion is 70% stenosed.  1st Mrg lesion is 30% stenosed.  Ost RCA to Prox RCA lesion is 30% stenosed.  RPAV lesion is 40% stenosed.  3rd RPL lesion is 60%  stenosed.   Findings:   Ao = 138/47 (72) LV = 136/2 RA =  4 RV = 29/1 PA = 33/4 (11) PCW = 10 (v = 25) Fick cardiac output/index = 5.7/3.0 PVR = < 1.0 WU Ao sat = 99% PA sat = 66%, 67%   Assessment: 1. Non-obstructive CAD 2. Severe NICM with EF 25% by echo 3. VT and frequent PVCs 4. Well-compensated hemodynamics     08/29/2019: TTE IMPRESSIONS  1. Severely reduced LVEF (25-30%) with global HK. LVOT VTI 10 cm which equates to CO/CI of 2.0 L/min and 1.0 L/min/m2.  2. Left ventricular ejection fraction, by visual estimation, is 25 to 30%. The left ventricle has severely decreased function. There is borderline left ventricular  hypertrophy.  3. Left ventricular diastolic parameters are consistent with Grade II diastolic dysfunction (pseudonormalization).  4. The left ventricle demonstrates global hypokinesis.  5. Global right ventricle has severely reduced systolic function.The right ventricular size is mildly enlarged. No increase in right ventricular wall thickness.  6. Left atrial size was normal.  7. Right atrial size was mild-moderately dilated.  8. Presence of pericardial fat pad.  9. The pericardial effusion is circumferential. 10. Trivial pericardial effusion is present. 11. The mitral valve is degenerative. Mild mitral valve regurgitation. 12. The tricuspid valve is grossly normal. Tricuspid valve regurgitation moderate-severe. 13. The aortic valve is tricuspid. Aortic valve regurgitation is trivial. Mild to moderate aortic valve sclerosis/calcification without any evidence of aortic stenosis. 14. The pulmonic valve was grossly normal. Pulmonic valve regurgitation is not visualized. 15. Normal pulmonary artery systolic pressure. 16. The inferior vena cava is dilated in size with <50% respiratory variability, suggesting right atrial pressure of 15 mmHg. 17. The tricuspid regurgitant velocity is 1.89 m/s, and with an assumed right atrial pressure of 15 mmHg, the estimated right ventricular systolic pressure is normal at 29.3 mmHg. 18. Elevated left atrial pressure.    Patient Profile     84 y.o. male history of DM2, HTN, HL, CAD, Chronic AF, and CKD 3 who was transferred from [facility] for further management of cardiogenic shock in setting AFL/tach with RVR   Ultimately felt rhythm to be VT  Assessment & Plan    1. VT     Off inotropes     Cath yesterday with non-obstructive CAD, no interventions     Continues to have frequent PVCs, and some NSVT     rebolused amio yesterday, remains on gtt     Add mexiletine today     Lytes are good  Dr. Curt Bears has seen and examined the patient this AM Most likely,  plan for life vest with re-evaluation of his LVEF in the future  2. Biventricular failure     AHF team  Managing  3. AFib      Post termination pause, this noted on EKG, converting from now what is felt to have been VT > SR     Described as chronic in initial notes?     Maintaining SR so far     CHA2DS2Vasc is 6, on  I am unclear on this history, looked at all epic EKGs, no AF noted He is maintaining SR currently, Yarelis Ambrosino look more into this and Kia Varnadore need to decide on a/c needs    For questions or updates, please contact [place] Please consult [email or web address] for contact info under        Signed, Baldwin Jamaica, PA-C  09/04/2019, 8:23 AM    I have seen and examined  this patient with Francis Dowse.  Agree with above, note added to reflect my findings.  On exam, irregular, no murmurs, lungs clear.  Patient with continued ventricular ectopy.  Left heart catheterization yesterday shows nonobstructive coronary artery disease that would not explain his overall decreased ejection fraction.  It is certainly possible that his low EF is due to a PVC induced cardiomyopathy.  He is currently on amiodarone which has stopped his prolonged runs, but he continues to have short runs of 4-5 beats as well as isolated PVCs.  We Oyindamola Key plan to add mexiletine today.  At this point, would fit for a LifeVest as his ejection fraction may improve with therapy for his PVCs.   He may need an outpatient monitor to determine his overall PVC burden during antiarrhythmic therapy to determine if ICD therapy is warranted. Guido Comp arrange a clinic appointment to discuss ICD options.  Catina Nuss M. Allan Minotti MD 09/04/2019 9:18 AM

## 2019-09-04 NOTE — Progress Notes (Signed)
Physical Therapy Treatment Patient Details Name: [name] [name] MRN: [id number] DOB: 1935/06/10 Today's Date: 09/04/2019    History of Present Illness 84 y/o male with ho. DM2, HTN. HL, CAD, chronic AF and CKD 3a (baseline creatinine 1.3-1.8) transferred from [facility] fur further management of cardiogenic shock in setting of atrial flutter/tach with RVR.    PT Comments    Patient continues to make progress toward PT goals and tolerated mobility well with VSS. This session focused on gait training with and without RW. Pt with one LOB with horizontal head turns and required assistance to recover without UE support. Continue to progress as tolerated.     Follow Up Recommendations  No PT follow up     Equipment Recommendations  None recommended by PT    Recommendations for Other Services       Precautions / Restrictions Precautions Precautions: Fall Restrictions Weight Bearing Restrictions: No    Mobility  Bed Mobility Overal bed mobility: (pt received and left in recliner)                Transfers Overall transfer level: Modified independent Equipment used: Rolling walker (2 wheeled)                Ambulation/Gait Ambulation/Gait assistance: Modified independent (Device/Increase time);Min guard Gait Distance (Feet): 600 Feet Assistive device: Rolling walker (2 wheeled);None Gait Pattern/deviations: Step-through pattern Gait velocity: functional   General Gait Details: use of RW initial lap around unit and then no AD second lap; pt wiht one LOB with horizontal head turn in hallway and required assist to recover but pt grossly requires no physical assist while ambulating   Stairs             Wheelchair Mobility    Modified Rankin (Stroke Patients Only)       Balance Overall balance assessment: Mild deficits observed, not formally tested                                          Cognition Arousal/Alertness:  Awake/alert Behavior During Therapy: WFL for tasks assessed/performed Overall Cognitive Status: Within Functional Limits for tasks assessed                                        Exercises      General Comments General comments (skin integrity, edema, etc.): VSS      Pertinent Vitals/Pain Pain Assessment: No/denies pain    Home Living                      Prior Function            PT Goals (current goals can now be found in the care plan section) Acute Rehab PT Goals Patient Stated Goal: To go home Progress towards PT goals: Progressing toward goals    Frequency    Min 3X/week      PT Plan Current plan remains appropriate    Co-evaluation              AM-PAC PT "6 Clicks" Mobility   Outcome Measure  Help needed turning from your back to your side while in a flat bed without using bedrails?: None Help needed moving from lying on your back to sitting on the side of a flat bed  without using bedrails?: None Help needed moving to and from a bed to a chair (including a wheelchair)?: None Help needed standing up from a chair using your arms (e.g., wheelchair or bedside chair)?: None Help needed to walk in hospital room?: None Help needed climbing 3-5 steps with a railing? : None 6 Click Score: 24    End of Session Equipment Utilized During Treatment: Gait belt Activity Tolerance: Patient tolerated treatment well Patient left: in chair;with call bell/phone within reach Nurse Communication: Mobility status PT Visit Diagnosis: Other abnormalities of gait and mobility (R26.89)     Time: 1511-1536 PT Time Calculation (min) (ACUTE ONLY): 25 min  Charges:  $Gait Training: 23-37 mins                     Earney Navy, PTA Acute Rehabilitation Services Pager: [phone] Office: [phone]     Darliss Cheney 09/04/2019, 4:41 PM

## 2019-09-04 NOTE — TOC Progression Note (Addendum)
Transition of Care [facility]) - Progression Note    Patient Details  Name: DENYS SALINGER MRN: [id number] Date of Birth: Aug 06, 1935  [facility] CM/SW Contact  Zenon Mayo, RN Phone Number: 09/04/2019, 12:01 PM  Clinical Narrative:    Patient will have zoll life vest at discharge, NCM faxed information to [facility] with [employer].  TOC team will continue to follow for dc needs.  Bidlil tablets, entresto  And mexitil are on the Medicaid preferred drug list and is covered by Medicaid. Patient has Medicaid insurance.         Expected Discharge Plan and Services                                                 Social Determinants of Health (SDOH) Interventions    Readmission Risk Interventions No flowsheet data found.

## 2019-09-04 NOTE — Care Management Important Message (Signed)
Important Message  Patient Details  Name: [name] [name] MRN: [id number] Date of Birth: [birth year]   Medicare Important Message Given:  Yes     Memory Argue 09/04/2019, 11:01 AM

## 2019-09-04 NOTE — Discharge Instructions (Signed)
Low-Sodium Nutrition Therapy  Eating less sodium can help you if you have high blood pressure, heart failure, or kidney or liver disease.  Your body needs a little sodium, but too much sodium can cause your body to hold onto extra water. This extra water will raise  your blood pressure and can cause damage to your heart, kidneys, or liver as they are forced to work harder.  Sometimes you can see how the extra fluid affects you because your hands, legs, or belly swell. You may also hold water  around your heart and lungs, which makes it hard to breathe.  E ven if you take medication for blood pressure or a water pill (diuretic) to remove fluid, it is still important to have less salt in  your diet.  Check with your primary care provider before drinking alcohol since it may affect the amount of fluid in your body and how  your heart, kidneys, or liver work.  Sodium in Food  A low-sodium meal plan limits the sodium that you get from food and beverages to 1,500-2,000 milligrams (mg) per day. Salt  is the main source of sodium. Read the nutrition label on the package to find out how much sodium is in one serving of a food.  Select foods with 140 milligrams (mg) of sodium or less per serving.  You may be able to eat one or two servings of foods with a little more than 140 milligrams (mg) of sodium if you are  closely watching how much sodium you eat in a day.  Check the serving size on the label. The amount of sodium listed on the label shows the amount in one serving of the  food. So, if you eat more than one serving, you will get more sodium than the amount listed.    Tips  Cutting Back on Sodium  Eat more fresh foods.  Fresh fruits and vegetables are low in sodium, as well as frozen vegetables and fruits that have no added  juices or sauces.  Fresh meats are lower in sodium than processed meats, such as bacon, sausage, and hotdogs.  Not all processed foods are unhealthy, but some processed foods may have too much  sodium.  Eat less salt at the table and when cooking. One of the ingredients in salt is sodium.  One teaspoon of table salt has 2,300 milligrams of sodium.  Leave the salt out of recipes for pasta, casseroles, and soups.  Be a smart shopper.    Food packages that say "Salt-free", sodium-free", "very low sodium," and "low sodium" have less than 140  milligrams of sodium per serving.  Beware of products identified as "Unsalted," "No Salt Added," "Reduced Sodium," or "Lower Sodium." These  items may still be high in sodium. You should always check the nutrition label.  Add flavors to your food without adding sodium.  Try lemon juice, lime juice, or vinegar.  Dry or fresh herbs add flavor.  Buy a sodium-free seasoning blend or make your own at home.  You can purchase salt-free or sodium-free condiments like barbeque sauce in stores and online. Ask your  registered dietitian nutritionist for recommendations and where to find them.  Eating in Restaurants  Choose foods carefully when you eat outside your home. Restaurant foods can be very high in sodium. Many restaurants  provide nutrition facts on their menus or their websites. If you cannot find that information, ask your server. Let your server  know that you want your food   to be cooked without salt and that you would like your salad dressing and sauces to be served  on the side.      Foods Recommended  Food Group Foods Recommended  Grains  Bread, bagels, rolls without salted tops  Homemade bread made with reduced- sodium baking powder  Cold cereals, especially shredded wheat and puffed rice  Oats, grits, or cream of wheat  Pastas, quinoa, and rice  Popcorn, pretzels or cra ckers without salt  Corn tortillas  Protein Foods  Fresh meats and fish; turkey bacon (check the nutrition labels - make sure they are not  packaged in a sodium solution)  Canned or packed tuna (no mo re than 4 ounces at 1 serving)  Beans and peas  Soybeans) and t ofu  Eggs  Nuts or nut butters  without salt  Dairy  Milk or milk powder  Plant milks, such as rice and soy  Yogurt, including Greek yogurt  Small amounts of natural chee se (blocks of cheese) or reduced-sodium cheese can be used in  moderation. (Swiss, ricotta, and fresh mozzarella cheese are lower in sodium than the others)  Cream Cheese  Low sodium co ttage cheese  Vegetables  Fresh and frozen vegetables without added sauces or salt  Homemade soups (without salt)  Low-sodium, salt-free or sodium- free canned vegetables and soups  Fruit Fresh and canned fruits  Dried fruits, such as rais ins, cranberries, and prunes  Oils Tub or liquid margarine, regular or without salt  Canola, corn, peanut, olive, safflower, or sunflo wer oils  Condiments  Fresh or dried herbs such as basil, bay leaf, dill, mustard (dry), nutmeg, paprika, parsley,  rosemary, sage, or thyme.  Low sodium ketchup  Vinegar  Lemon or lime juice  Pepper, red pepper flakes, and cayenne.  Hot sauce contains sodium, but if you us e just a drop or two, it will not add up to much.  Salt-free o r sodium-free seasoning mixes and marinades  Simple salad dressings: vinegar and oil      Foods Not Recommended  Food Group Foods Not Recommended  Grains  Breads or crackers topped with salt  Cereals (hot/cold) with more than 3 00 mg sodium per serving  Biscuits, cornbread, and other "quick" breads prepared with b aking soda  Pre-packaged bread crumbs  Seasoned and packaged rice and pasta mixes  Self-rising flours  Protein Foods  Cured meats: Bacon, ham, sausage, pepperoni and hot dogs  Canned meats (chili, vienna sausage, or sardines)  Smoked fish and meats  Frozen meals that have more than 600 mg of sodium per serving  Egg substitute (with added sodium)  Dairy  Buttermilk  Processed cheese spreads  Cottage cheese (1 cup ma y have over 500 mg of sodium; look for low-sodium.)  American or feta cheese  Shredded Cheese has m ore sodium than blocks of cheese  String cheese  Vegetables  Canned  vegetables (unless they are salt-free, sodium-free or low sodium)  Frozen vegetables with seasoning and sauces  Sauerkraut and pickled vegetables  Canned or dried soups (unless they are salt-free, sodium-free, or low sodium)  French fries and onion rings  Fruit Dried fruits preserved with additives that have sodium  Oils Salted butter or margarine, all types of olives  Condiments  Salt, sea salt, kosher salt, onion salt, and garlic salt  Seasoning mixes with salt  Bouillon cubes  Ketchup  Barbeque sauce and Worcestershire sauce unless low sodium  Soy sauce  Salsa, pick les,   olives, relish  Salad dressings: ranch, blue cheese, Italian, and French.    Low Sodium Sample 1-Day Menu  Breakfast  1 cup cooked oatmeal  1 slice whole wheat bread toast  1 tablespoon peanut butter without salt  1 banana  1 cup 1% milk  Lunch  Tacos made with: 2 corn tortillas   cup black beans, low sodium   cup roasted or grilled chicken (without skin)   avocado  Squeeze of lime juice  1 cup salad greens  1 tablespoon low-sodium salad dressing   cup strawberries  1 orange  Afternoon Snack 1/3 cup grapes  6 ounces yogurt  Evening Meal  3 ounces herb-baked fish  1 baked potato  2 teaspoons olive oil   cup cooked carrots  2 thick slices tomatoes on:  2 lettuce leaves  1 teaspoon olive oil  1 teaspoon balsamic vinegar  1 cup 1% milk  Evening Snack 1 apple   cup almonds without salt

## 2019-09-05 DIAGNOSIS — I4891 Unspecified atrial fibrillation: Secondary | ICD-10-CM

## 2019-09-05 LAB — BASIC METABOLIC PANEL
Anion gap: 10 (ref 5–15)
BUN: 10 mg/dL (ref 8–23)
CO2: 24 mmol/L (ref 22–32)
Calcium: 8.3 mg/dL — ABNORMAL LOW (ref 8.9–10.3)
Chloride: 106 mmol/L (ref 98–111)
Creatinine, Ser: 1.51 mg/dL — ABNORMAL HIGH (ref 0.61–1.24)
GFR calc Af Amer: 49 mL/min — ABNORMAL LOW (ref >60)
GFR calc non Af Amer: 42 mL/min — ABNORMAL LOW (ref >60)
Glucose, Bld: 111 mg/dL — ABNORMAL HIGH (ref 70–99)
Potassium: 4.2 mmol/L (ref 3.5–5.1)
Sodium: 140 mmol/L (ref 135–145)

## 2019-09-05 LAB — CBC
HCT: 26.3 % — ABNORMAL LOW (ref 39.0–52.0)
Hemoglobin: 8.3 g/dL — ABNORMAL LOW (ref 13.0–17.0)
MCH: 28.8 pg (ref 26.0–34.0)
MCHC: 31.6 g/dL (ref 30.0–36.0)
MCV: 91.3 fL (ref 80.0–100.0)
Platelets: 137 10*3/uL — ABNORMAL LOW (ref 150–400)
RBC: 2.88 MIL/uL — ABNORMAL LOW (ref 4.22–5.81)
RDW: 16.3 % — ABNORMAL HIGH (ref 11.5–15.5)
WBC: 4.8 10*3/uL (ref 4.0–10.5)
nRBC: 0 % (ref 0.0–0.2)

## 2019-09-05 LAB — GLUCOSE, CAPILLARY
Glucose-Capillary: 92 mg/dL (ref 70–99)
Glucose-Capillary: 94 mg/dL (ref 70–99)
Glucose-Capillary: 89 mg/dL (ref 70–99)
Glucose-Capillary: 96 mg/dL (ref 70–99)

## 2019-09-05 LAB — MAGNESIUM: Magnesium: 2 mg/dL (ref 1.7–2.4)

## 2019-09-05 MED ORDER — AMIODARONE HCL 200 MG PO TABS
400.0000 mg | ORAL_TABLET | Freq: Two times a day (BID) | ORAL | Status: DC
  Administered 2019-09-05 (×2): 400 mg via ORAL
  Filled 2019-09-05 (×2): qty 2

## 2019-09-05 NOTE — Progress Notes (Signed)
Changed Central line dressing.

## 2019-09-05 NOTE — Discharge Summary (Addendum)
Advanced Heart Failure Team  Discharge Summary   Patient ID: [name] [name] MRN: [id number], DOB/AGE: 1934/09/17 84 y.o. Admit date: 08/28/2019 D/C date:     09/06/2019   Primary Discharge Diagnoses:  1. Acute systolic HF with severe biventricular dysfunction ->cardiogenic shock 2. VT  3. CAD 4. AKI on CKD3 5. DM2 6. Smell of kerosene 7. Acute hypoxic respiratory failure 8. Hypokalemia 9. Hypomag 10. [facility]  Hospital Course:  Mr Schlicher is an 84 year old with history of DM2, HTN. HL, CAD, chronic AF and CKD 3a (baseline creatinine 1.3-1.8) transferred from [facility] fur further management of cardiogenic shock in setting of atrial flutter/tach with RVR.  Went to [facility] ER and fount to be AFL/AF with RVR with rates in 190s. Reportedly was hypothermic with temp of 88 degrees and smelled like kerosene.Given 25mg  of IV diltiazem in ER and became markedly hypotensive. Underwent attempted DC-CV without effect. Felt to have code sepsis as well. Started on NE and vanc/cefipime. As he improved pressors weaned off. Had RHC/LHC with nonobstructive CAD, VT/freqent PVCs and well compensated hemodynamics.   EP consulted for VT and frequent PVCs. Loaded on amio drip and due to ongoing PVCs mexiletene was added. Discharging with Life Vest.   See below for detailed problems list. He will contine to be followed closely in the HF clinic.  He is being discharged with a life vest.   1. Acute systolic HF with severe biventricular dysfunction ->cardiogenic shock - Echo with EF 25% with severe RV dysfunction - Suspect VT due to low EF (not vice versa) - Dig level. Continue digoxin 0.0625 mg daily.   - Continue spiro 25 daily  -  Continue Entresto 49/51 bid -  Continue Bidil 1 tab tid  2. VT  - failed DC-CV in ER on 12/15 - (only on ASA/plavix at home) will need full AC - On amio gtt then transitioned to amio 400 mg twice a day with transition.  - EP following --> Recommendations for Life Vest.  Continue amio and mexiletine added today.  - Life Vest has been ordered. He was discharged with Life Vest.   3. CAD - troponins ok despite marked hemodynamic stress - LHC 09/03/19 Nonobstructive CAD.  - Continue ASA - Stop pravastatin. Started atorvastatin 40 mg daily.   4. AKI on CKD3 - baseline creatinine 1.7  - Creatinine peaked 3.45 due to ATN from shock .  - Renal function improved. Creatinine on the day discharge was stable.    5. DM2 - cover with SSI - hgb a1c 7.0  6. Smell of kerosene - carboxyhgb ok   7. Acute hypoxic respiratory failure -Initially on Bipap. Stable oxygen saturations on room air.   8. Hypokalemia:  - K stable today.   9. Hypomag - Stable.   10. PAF - based on outside notes - start Eliquis 2.5 bid Discharge Weight: 153 pounds   Discharge Vitals: Blood pressure (!) 153/57, pulse (!) 45, temperature 98.1 F (36.7 C), temperature source Oral, resp. rate 19, height 5\' 11"  (1.803 m), weight 69.7 kg, SpO2 96 %.  General:  Well appearing. No resp difficulty HEENT: normal Neck: supple. no JVD. Carotids 2+ bilat; no bruits. No lymphadenopathy or thryomegaly appreciated. Cor: PMI nondisplaced. Regular rate & rhythm. No rubs, gallops or murmurs. Lungs: clear Abdomen: soft, nontender, nondistended. No hepatosplenomegaly. No bruits or masses. Good bowel sounds. Extremities: no cyanosis, clubbing, rash, edema Neuro: alert & orientedx3, cranial nerves grossly intact. moves all 4 extremities w/o difficulty. Affect  pleasant  Labs: Lab Results  Component Value Date   WBC 5.0 09/06/2019   HGB 9.3 (L) 09/06/2019   HCT 29.2 (L) 09/06/2019   MCV 91.0 09/06/2019   PLT 176 09/06/2019    Recent Labs  Lab 09/06/19 0730  NA 140  K 4.4  CL 108  CO2 23  BUN 11  CREATININE 1.64*  CALCIUM 8.6*  GLUCOSE 93   No results found for: CHOL, HDL, LDLCALC, TRIG BNP (last 3 results) Recent Labs    08/28/19 1919  BNP 1,337.0*    ProBNP (last 3  results) No results for input(s): PROBNP in the last 8760 hours.   Diagnostic Studies/Procedures  ECHO 08/29/19 . Severely reduced LVEF (25-30%) with global HK. LVOT VTI 10 cm which equates to CO/CI of 2.0 L/min and 1.0 L/min/m2.  2. Left ventricular ejection fraction, by visual estimation, is 25 to 30%. The left ventricle has severely decreased function. There is borderline left ventricular hypertrophy.  3. Left ventricular diastolic parameters are consistent with Grade II diastolic dysfunction (pseudonormalization).  4. The left ventricle demonstrates global hypokinesis.  5. Global right ventricle has severely reduced systolic function.The right ventricular size is mildly enlarged. No increase in right ventricular wall thickness.  6. Left atrial size was normal.  7. Right atrial size was mild-moderately dilated.  8. Presence of pericardial fat pad.  9. The pericardial effusion is circumferential. 10. Trivial pericardial effusion is present. 11. The mitral valve is degenerative. Mild mitral valve regurgitation. 12. The tricuspid valve is grossly normal. Tricuspid valve regurgitation moderate-severe. 13. The aortic valve is tricuspid. Aortic valve regurgitation is trivial. Mild to moderate aortic valve sclerosis/calcification without any evidence of aortic stenosis. 14. The pulmonic valve was grossly normal. Pulmonic valve regurgitation is not visualized. 15. Normal pulmonary artery systolic pressure. 16. The inferior vena cava is dilated in size with <50% respiratory variability, suggesting right atrial pressure of 15 mmHg. 17. The tricuspid regurgitant velocity is 1.89 m/s, and with an assumed right atrial pressure of 15 mmHg, the estimated right ventricular systolic pressure is normal at 29.3 mmHg. 18. Elevated left atrial pressure.   RHC/LHC 09/03/19   Ost LAD to Prox LAD lesion is 40% stenosed.  2nd Diag lesion is 60% stenosed.  Dist Cx lesion is 70% stenosed.  1st Mrg lesion  is 30% stenosed.  Ost RCA to Prox RCA lesion is 30% stenosed.  RPAV lesion is 40% stenosed.  3rd RPL lesion is 60% stenosed.   Findings:  Ao = 138/47 (72) LV = 136/2 RA =  4 RV = 29/1 PA = 33/4 (11) PCW = 10 (v = 25) Fick cardiac output/index = 5.7/3.0 PVR = < 1.0 WU Ao sat = 99% PA sat = 66%, 67%  Assessment: 1. Non-obstructive CAD 2. Severe NICM with EF 25% by echo 3. VT and frequent PVCs 4. Well-compensated hemodynamics  Discharge Medications   Allergies as of 09/06/2019   No Known Allergies     Medication List    STOP taking these medications   amLODipine 10 MG tablet Commonly known as: NORVASC   aspirin 81 MG tablet   carvedilol 12.5 MG tablet Commonly known as: COREG   clopidogrel 75 MG tablet Commonly known as: PLAVIX   diltiazem 300 MG 24 hr capsule Commonly known as: CARDIZEM CD   Klor-Con M20 20 MEQ tablet Generic drug: potassium chloride SA   lisinopril 40 MG tablet Commonly known as: ZESTRIL   pravastatin 40 MG tablet Commonly known as: PRAVACHOL  TAKE these medications   allopurinol 100 MG tablet Commonly known as: ZYLOPRIM Take 100 mg by mouth daily.   amiodarone 200 MG tablet Commonly known as: PACERONE Take 1 tablet (200 mg total) by mouth daily.   apixaban 2.5 MG Tabs tablet Commonly known as: ELIQUIS Take 1 tablet (2.5 mg total) by mouth 2 (two) times daily.   atorvastatin 40 MG tablet Commonly known as: LIPITOR Take 1 tablet (40 mg total) by mouth daily at 6 PM.   furosemide 20 MG tablet Commonly known as: LASIX Take 1 tablet (20 mg total) by mouth as needed. What changed:   when to take this  reasons to take this   glipiZIDE XL 10 MG 24 hr tablet Generic drug: glipiZIDE Take 10 mg by mouth daily.   isosorbide-hydrALAZINE 20-37.5 MG tablet Commonly known as: BIDIL Take 1 tablet by mouth 3 (three) times daily.   mexiletine 150 MG capsule Commonly known as: MEXITIL Take 2 capsules (300 mg total) by  mouth every 12 (twelve) hours.   sacubitril-valsartan 49-51 MG Commonly known as: ENTRESTO Take 1 tablet by mouth 2 (two) times daily.   spironolactone 25 MG tablet Commonly known as: ALDACTONE Take 1 tablet (25 mg total) by mouth daily.       Disposition   The patient will be discharged in stable condition to home. Discharge Instructions    (HEART FAILURE PATIENTS) Call MD:  Anytime you have any of the following symptoms: 1) 3 pound weight gain in 24 hours or 5 pounds in 1 week 2) shortness of breath, with or without a dry hacking cough 3) swelling in the hands, feet or stomach 4) if you have to sleep on extra pillows at night in order to breathe.   Complete by: As directed    Diet - low sodium heart healthy   Complete by: As directed    Increase activity slowly   Complete by: As directed      Follow-up Information    Regan Lemming, MD Follow up.   Specialty: Cardiology Why: 11/06/2019 @ 3:00PM Contact information: [address] [place] [address] [phone]        [facility] Follow up on 09/20/2019.   Specialty: Cardiology Why: at [address] Contact information: [address] [id number] [place] [address] [phone]            Duration of Discharge Encounter: Greater than 35 minutes   Signed, Tonye Becket, NP-C 09/06/2019, 8:26 AM  Patient seen and examined with the above-signed Advanced Practice Provider and/or Housestaff. I personally reviewed laboratory data, imaging studies and relevant notes. I independently examined the patient and formulated the important aspects of the plan. I have edited the note to reflect any of my changes or salient points. I have personally discussed the plan with the patient and/or family.  He is improved this am. PVCs and NSVT much improved on amio and mexilitene but remains bradycardic in 40s. Would stop digoxin. Cut amio to  200 daily. He has LifeVest and I discussed the need to wear it at all times with him.   He has h/p PAF per notes and we will start Eliquis 2.5 bid.   Arvilla Meres, MD  8:26 AM

## 2019-09-05 NOTE — Progress Notes (Signed)
CARDIAC REHAB PHASE I   PRE:  Rate/Rhythm: 43 SB    BP: sitting 137/60    SaO2: 100 RA  MODE:  Ambulation: 820 ft   POST:  Rate/Rhythm: 64 SR    BP: sitting 162/64     SaO2: 100 RA  Pt asleep on my arrival (had been asleep this morning as well). Slow to wake up, slightly dizzy upon standing and had to sit again. Ambulated with RW, fairly steady but turns were slightly unsteady. Pt crossed feet or got them close together. No major c/o but quiet while walking. To recliner. VSS.  [place], ACSM 09/05/2019 2:46 PM

## 2019-09-05 NOTE — Progress Notes (Signed)
Patient refused the use of BIPAP for the evening. Patient resting comfortably on room air. Will continue to monitor pt.

## 2019-09-05 NOTE — Progress Notes (Signed)
Patient's CVP 2

## 2019-09-05 NOTE — Plan of Care (Signed)

## 2019-09-05 NOTE — Progress Notes (Addendum)
Advanced Heart Failure Rounding Note   Subjective:    LHC 09/04/19  1. Non-obstructive CAD 2. Severe NICM with EF 25% by echo 3. VT and frequent PVCs 4. Well-compensated hemodynamics  Yesterday he started on mexilitene due to high PVC burden. PVC burden lower.   Earlier today amio drip transitioned to po amio.   Denies SOB. Feels much better today.    Objective:   Weight Range:  Vital Signs:   Temp:  [97.4 F (36.3 C)-98.6 F (37 C)] 98 F (36.7 C) (12/23 1112) Pulse Rate:  [44-65] 44 (12/23 1112) Resp:  [17-23] 19 (12/23 1112) BP: (129-145)/(55-66) 136/55 (12/23 1112) SpO2:  [95 %-100 %] 100 % (12/23 1112) Weight:  [69.7 kg] 69.7 kg (12/23 0500) Last BM Date: 09/03/19  Weight change: Filed Weights   09/02/19 1333 09/04/19 0516 09/05/19 0500  Weight: 72.7 kg 69.9 kg 69.7 kg    Intake/Output:   Intake/Output Summary (Last 24 hours) at 09/05/2019 1348 Last data filed at 09/05/2019 0644 Gross per 24 hour  Intake 400.4 ml  Output 550 ml  Net -149.6 ml     Physical Exam: General:  In bed.  No resp difficulty HEENT: normal Neck: supple. no JVD. Carotids 2+ bilat; no bruits. No lymphadenopathy or thryomegaly appreciated. Cor: PMI nondisplaced. Regular rate & rhythm. No rubs, gallops. 2/6 MR  or murmurs. Lungs: clear Abdomen: soft, nontender, nondistended. No hepatosplenomegaly. No bruits or masses. Good bowel sounds. Extremities: no cyanosis, clubbing, rash, edema Neuro: alert & orientedx3, cranial nerves grossly intact. moves all 4 extremities w/o difficulty. Affect pleasant   Telemetry: SR/SB 40-60s. Occasional PVCs.   Labs: Basic Metabolic Panel: Recent Labs  Lab 09/01/19 0248 09/02/19 0509 09/03/19 0451 09/03/19 0944 09/03/19 0947 09/03/19 0951 09/04/19 0500 09/05/19 0558  NA 138 137 137 142 139  140 139 139 140  K 4.6 4.1 4.2 3.9 4.2  4.1 4.2 4.0 4.2  CL 104 105 104  --   --   --  105 106  CO2 26 24 25   --   --   --  24 24  GLUCOSE  109* 115* 130*  --   --   --  124* 111*  BUN 17 12 14   --   --   --  14 10  CREATININE 1.34* 1.20 1.47*  --   --   --  1.74* 1.51*  CALCIUM 8.5* 8.9 8.6*  --   --   --  8.4* 8.3*  MG 1.7  --   --   --   --   --  2.2 2.0    Liver Function Tests: No results for input(s): AST, ALT, ALKPHOS, BILITOT, PROT, ALBUMIN in the last 168 hours. No results for input(s): LIPASE, AMYLASE in the last 168 hours. No results for input(s): AMMONIA in the last 168 hours.  CBC: Recent Labs  Lab 09/01/19 0248 09/02/19 0509 09/03/19 0451 09/03/19 0944 09/03/19 0947 09/03/19 0951 09/04/19 0500 09/05/19 0558  WBC 7.1 5.5 6.1  --   --   --  7.3 4.8  HGB 9.5* 10.6* 10.3* 9.9* 10.2*  10.5* 10.5* 10.1* 8.3*  HCT 30.0* 33.4* 31.8* 29.0* 30.0*  31.0* 31.0* 30.7* 26.3*  MCV 90.4 90.5 89.1  --   --   --  88.7 91.3  PLT 142* 157 157  --   --   --  153 137*    Cardiac Enzymes: No results for input(s): CKTOTAL, CKMB, CKMBINDEX, TROPONINI in the last 168  hours.  BNP: BNP (last 3 results) Recent Labs    08/28/19 1919  BNP 1,337.0*    ProBNP (last 3 results) No results for input(s): PROBNP in the last 8760 hours.    Other results:  Imaging: No results found.   Medications:     Scheduled Medications: . amiodarone  400 mg Oral BID  . aspirin  81 mg Oral Daily  . atorvastatin  40 mg Oral q1800  . Chlorhexidine Gluconate Cloth  6 each Topical Daily  . digoxin  0.0625 mg Oral Daily  . enoxaparin (LOVENOX) injection  40 mg Subcutaneous Q24H  . feeding supplement (ENSURE ENLIVE)  237 mL Oral TID BM  . influenza vaccine adjuvanted  0.5 mL Intramuscular Tomorrow-1000  . insulin aspart  0-9 Units Subcutaneous TID WC  . isosorbide-hydrALAZINE  1 tablet Oral TID  . mexiletine  300 mg Oral Q12H  . multivitamin with minerals  1 tablet Oral Daily  . pneumococcal 23 valent vaccine  0.5 mL Intramuscular Tomorrow-1000  . sacubitril-valsartan  1 tablet Oral BID  . sodium chloride flush  3 mL Intravenous  Q12H  . sodium chloride flush  3 mL Intravenous Q12H  . spironolactone  25 mg Oral Daily    Infusions: . sodium chloride    . sodium chloride      PRN Medications: sodium chloride, sodium chloride, acetaminophen, guaiFENesin-dextromethorphan, ondansetron (ZOFRAN) IV, sodium chloride flush, sodium chloride flush   Assessment/Plan:    1. Acute systolic HF with severe biventricular dysfunction  -> cardiogenic shock - Echo with EF 25% with severe RV dysfunction - Suspect VT due to low EF (not vice versa) - Creatinine 1.5 today. Stable.   - Dig level .8.  Cut back digoxin 0.0625 mg daily.   - Continue spiro 25 daily  -  Continue Entresto 49/51 bid -  Continue Bidil 1 tab tid  2. VT  - failed DC-CV in ER on 12/15 - (only on ASA/plavix at home) will need full AC - Amio drip stopped today. On po amio. .   - EP following --> Recommendations for Life Vest. Continue amio 400 mg twice a day x7 days the 200 mg daily.  and mexiletine 300 mg twice a day.   - Life Vest has been ordered. - Keep K> 4.0 Mg > 2.0 - EP follow up has been set up.   3. CAD - troponins ok despite marked hemodynamic stress - LHC 09/03/19 Nonobstructive CAD.  - Continue ASA - Stop pravastatin. Start atorvastatin 40 mg daily.   4. AKI on CKD3 - baseline creatinine 1.7  - Creatinine peaked 3.45 due to ATN from shock . Todays creatinine 1.5.    5. DM2 - cover with SSI - hgb a1c 7.0  6. Smell of kerosene - carboxyhgb ok   7. Acute hypoxic respiratory failure -Initially on Bipap. Stable oxygen saturations on room air.   8. Hypokalemia:  Stable.   9. Hypomag - Stable.    Life Vest has been requested.  Check on insurance coverage for mexiletine $1.30 for 90 days,  bidil $3.90  30 day and entresto $3.90    Length of Stay: [place] NP-C  09/05/2019, 1:48 PM  Advanced Heart Failure Team Pager [phone] (M-F; 7a - 4p)  Please contact [facility] for night-coverage after hours (4p -7a  ) and w   Patient seen and examined with the above-signed Advanced Practice Provider and/or Housestaff. I personally reviewed laboratory data, imaging studies and relevant notes.  I independently examined the patient and formulated the important aspects of the plan. I have edited the note to reflect any of my changes or salient points. I have personally discussed the plan with the patient and/or family.  Now on po amio and mexilitene. PVCs and NSVT burden much improved. HF now well compensated. On good HF meds, BP trending up again. May need further titration,.He was walking floor independently today and I spoke with his niece who says that although he lives alone he has a lot of family nearby to help him.   LifeVest has been ordered and paperwork completed. I spoke to LifeVest team personally and they will fit tomorrow.   Appreciate EP input. Based on Care Everywhere notes he has h/o AF and we will start Eliquis.  We will follow him in HF Clinic for now but eventually should transition to care [facility] in [place].   Arvilla Meres, MD  11:44 PM

## 2019-09-05 NOTE — Progress Notes (Addendum)
Progress Note  Patient Name: [name] [name] Date of Encounter: 09/05/2019  Primary Cardiologist: No primary care provider on file.   Subjective   Feels "really good", eating breakfast, talking comfortably, denies any symptoms  Inpatient Medications    Scheduled Meds: . aspirin  81 mg Oral Daily  . atorvastatin  40 mg Oral q1800  . Chlorhexidine Gluconate Cloth  6 each Topical Daily  . digoxin  0.0625 mg Oral Daily  . enoxaparin (LOVENOX) injection  40 mg Subcutaneous Q24H  . feeding supplement (ENSURE ENLIVE)  237 mL Oral TID BM  . influenza vaccine adjuvanted  0.5 mL Intramuscular Tomorrow-1000  . insulin aspart  0-9 Units Subcutaneous TID WC  . isosorbide-hydrALAZINE  1 tablet Oral TID  . mexiletine  300 mg Oral Q12H  . multivitamin with minerals  1 tablet Oral Daily  . pneumococcal 23 valent vaccine  0.5 mL Intramuscular Tomorrow-1000  . sacubitril-valsartan  1 tablet Oral BID  . sodium chloride flush  3 mL Intravenous Q12H  . sodium chloride flush  3 mL Intravenous Q12H  . spironolactone  25 mg Oral Daily   Continuous Infusions: . sodium chloride    . sodium chloride    . amiodarone 30 mg/hr (09/04/19 1900)   PRN Meds: sodium chloride, sodium chloride, acetaminophen, guaiFENesin-dextromethorphan, ondansetron (ZOFRAN) IV, sodium chloride flush, sodium chloride flush   Vital Signs    Vitals:   09/04/19 2323 09/05/19 0347 09/05/19 0500 09/05/19 0727  BP: 129/66 (!) 133/55  (!) 145/61  Pulse: 60 (!) 52  (!) 47  Resp: 17 17  18   Temp: 98.6 F (37 C) 98.4 F (36.9 C)  (!) 97.5 F (36.4 C)  TempSrc: Oral Oral  Oral  SpO2: 95% 96%  99%  Weight:   69.7 kg   Height:        Intake/Output Summary (Last 24 hours) at 09/05/2019 0747 Last data filed at 09/05/2019 0644 Gross per 24 hour  Intake 400.4 ml  Output 550 ml  Net -149.6 ml   Last 3 Weights 09/05/2019 09/04/2019 09/02/2019  Weight (lbs) 153 lb 10.6 oz 154 lb 1.6 oz 160 lb 4.4 oz  Weight (kg) 69.7  kg 69.9 kg 72.7 kg      Telemetry    SB/SR high 40's-60's, marked reduction in PVCs, no NSVT since yesterday afternoon- Personally Reviewed  ECG    No new EKGs - Personally Reviewed  Physical Exam    GEN: No acute distress.   Neck: central line L Cardiac: RRR, bradycardic, no murmurs, rubs, or gallops.  Respiratory: CTA b/l. GI: Soft, nontender, non-distended  MS: No edema; thin, age appropriate atrophy Neuro:  Nonfocal  Psych: Normal affect   Labs    High Sensitivity Troponin:   Recent Labs  Lab 08/28/19 1919 08/28/19 2126 08/29/19 0059  TROPONINIHS 35* 40* 65*      Chemistry Recent Labs  Lab 09/03/19 0451 09/03/19 0951 09/04/19 0500 09/05/19 0558  NA 137 139 139 140  K 4.2 4.2 4.0 4.2  CL 104  --  105 106  CO2 25  --  24 24  GLUCOSE 130*  --  124* 111*  BUN 14  --  14 10  CREATININE 1.47*  --  1.74* 1.51*  CALCIUM 8.6*  --  8.4* 8.3*  GFRNONAA 44*  --  36* 42*  GFRAA 51*  --  41* 49*  ANIONGAP 8  --  10 10     Hematology Recent Labs  Lab 09/03/19  0451 09/03/19 0951 09/04/19 0500 09/05/19 0558  WBC 6.1  --  7.3 4.8  RBC 3.57*  --  3.46* 2.88*  HGB 10.3* 10.5* 10.1* 8.3*  HCT 31.8* 31.0* 30.7* 26.3*  MCV 89.1  --  88.7 91.3  MCH 28.9  --  29.2 28.8  MCHC 32.4  --  32.9 31.6  RDW 15.9*  --  16.1* 16.3*  PLT 157  --  153 137*    BNP No results for input(s): BNP, PROBNP in the last 168 hours.   DDimer No results for input(s): DDIMER in the last 168 hours.   Radiology    CARDIAC CATHETERIZATION  Result Date: 09/03/2019  Ost LAD to Prox LAD lesion is 40% stenosed.  2nd Diag lesion is 60% stenosed.  Dist Cx lesion is 70% stenosed.  1st Mrg lesion is 30% stenosed.  Ost RCA to Prox RCA lesion is 30% stenosed.  RPAV lesion is 40% stenosed.  3rd RPL lesion is 60% stenosed.  Findings: Ao = 138/47 (72) LV = 136/2 RA =  4 RV = 29/1 PA = 33/4 (11) PCW = 10 (v = 25) Fick cardiac output/index = 5.7/3.0 PVR = < 1.0 WU Ao sat = 99% PA sat = 66%,  67% Assessment: 1. Non-obstructive CAD 2. Severe NICM with EF 25% by echo 3. VT and frequent PVCs 4. 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 PVCs not completely suppressed with amio. May need mexilitene. Leonardo Plaia d/w EP regarding AA therapy as well as potential ICD vs LifeVest. Arvilla Meres, MD 10:06 AM    Cardiac Studies    09/03/2019: R/LHC  Ost LAD to Prox LAD lesion is 40% stenosed.  2nd Diag lesion is 60% stenosed.  Dist Cx lesion is 70% stenosed.  1st Mrg lesion is 30% stenosed.  Ost RCA to Prox RCA lesion is 30% stenosed.  RPAV lesion is 40% stenosed.  3rd RPL lesion is 60% stenosed.   Findings:   Ao = 138/47 (72) LV = 136/2 RA =  4 RV = 29/1 PA = 33/4 (11) PCW = 10 (v = 25) Fick cardiac output/index = 5.7/3.0 PVR = < 1.0 WU Ao sat = 99% PA sat = 66%, 67%   Assessment: 1. Non-obstructive CAD 2. Severe NICM with EF 25% by echo 3. VT and frequent PVCs 4. Well-compensated hemodynamics     08/29/2019: TTE IMPRESSIONS  1. Severely reduced LVEF (25-30%) with global HK. LVOT VTI 10 cm which equates to CO/CI of 2.0 L/min and 1.0 L/min/m2.  2. Left ventricular ejection fraction, by visual estimation, is 25 to 30%. The left ventricle has severely decreased function. There is borderline left ventricular hypertrophy.  3. Left ventricular diastolic parameters are consistent with Grade II diastolic dysfunction (pseudonormalization).  4. The left ventricle demonstrates global hypokinesis.  5. Global right ventricle has severely reduced systolic function.The right ventricular size is mildly enlarged. No increase in right ventricular wall thickness.  6. Left atrial size was normal.  7. Right atrial size was mild-moderately dilated.  8. Presence of pericardial fat pad.  9. The pericardial effusion is circumferential. 10. Trivial pericardial effusion is present. 11. The mitral valve is degenerative. Mild mitral valve  regurgitation. 12. The tricuspid valve is grossly normal. Tricuspid valve regurgitation moderate-severe. 13. The aortic valve is tricuspid. Aortic valve regurgitation is trivial. Mild to moderate aortic valve sclerosis/calcification without any evidence of aortic stenosis. 14. The pulmonic valve was grossly normal. Pulmonic valve regurgitation is not visualized. 15. Normal pulmonary  artery systolic pressure. 16. The inferior vena cava is dilated in size with <50% respiratory variability, suggesting right atrial pressure of 15 mmHg. 17. The tricuspid regurgitant velocity is 1.89 m/s, and with an assumed right atrial pressure of 15 mmHg, the estimated right ventricular systolic pressure is normal at 29.3 mmHg. 18. Elevated left atrial pressure.    Patient Profile     84 y.o. male history of DM2, HTN, HL, CAD, Chronic AF, and CKD 3 who was transferred from [facility] for further management of cardiogenic shock in setting AFL/tach with RVR   Ultimately felt rhythm to be VT  Assessment & Plan    1. VT     Off inotropes     Cath with non-obstructive CAD, no interventions     On amio gtt and mexiletine      Marked improvement in PVCs no NSVT since yesterday afternoon Transition to 400mg  BID amiodarone today to reduce in 1/2 every 2 weeks until 200mg  daily maintenance dose Continue mexiletine  Discussed life vest with the patient, he is agreeable, recommend he be fit before discharge (please let us know if you prefer [place] to arrange this) Recommend 75mo echo on GDMT and AAD  EP follow up in place (2 mo)  He did not have syncope, though was recommended against driving until follow up   2. Biventricular failure     AHF team  Managing     Pt feels well, denies any symptoms  3. AFib     Described as chronic in notes     Maintaining SR so far     CHA2DS2Vasc is 6  Last cardiology note I see in care every where is 2015, Dr. Gwen Pounds.  He mentioned him being in AFib, on BB/CCB and would need  consideration for a/c He is maintaining SR here  He should be anticoagulated chronically, Daxtyn Rottenberg leave choice of OAC to Dr.Bensimhon/primary team      For questions or updates, please contact [facility] Please consult [email or web address] for contact info under        Signed, Sheilah Pigeon, PA-C  09/05/2019, 7:47 AM    I have seen and examined this patient with Francis Dowse.  Agree with above, note added to reflect my findings.  On exam, RRR, no murmurs, lungs clear.  Patient started on mexiletine yesterday.  This is greatly improved his overall wellbeing.  He says he feels much improved this morning.  He was having quite a few PVCs and short runs of VT yesterday in the afternoon, but after his mexiletine, this is all calmed down quite a bit.  At this point, would continue both amiodarone and mexiletine.  We Gabby Rackers switch amiodarone to p.o. today at 400 mg twice a day.  In 2 weeks, he can go down to 200 mg a day.  We Xue Low arrange for follow-up in EP clinic.  He Samaira Holzworth need an outpatient monitor for PVC burden as well as an echo in 3 months.  He apparently does have a history of atrial fibrillation described in prior notes.  He would likely benefit from anticoagulation per primary team.  Jahmeer Porche M. Makiah Clauson MD 09/05/2019 9:08 AM

## 2019-09-05 NOTE — Progress Notes (Signed)
Notified the on call covering provider that the patient heart is sustaining in mid 40's. There were no concerns or additional orders given. Will continue to monitor for patient safety. Patient remained asymptomatic.

## 2019-09-06 LAB — BASIC METABOLIC PANEL
Anion gap: 9 (ref 5–15)
BUN: 11 mg/dL (ref 8–23)
CO2: 23 mmol/L (ref 22–32)
Calcium: 8.6 mg/dL — ABNORMAL LOW (ref 8.9–10.3)
Chloride: 108 mmol/L (ref 98–111)
Creatinine, Ser: 1.64 mg/dL — ABNORMAL HIGH (ref 0.61–1.24)
GFR calc Af Amer: 44 mL/min — ABNORMAL LOW (ref >60)
GFR calc non Af Amer: 38 mL/min — ABNORMAL LOW (ref >60)
Glucose, Bld: 93 mg/dL (ref 70–99)
Potassium: 4.4 mmol/L (ref 3.5–5.1)
Sodium: 140 mmol/L (ref 135–145)

## 2019-09-06 LAB — CBC
HCT: 29.2 % — ABNORMAL LOW (ref 39.0–52.0)
Hemoglobin: 9.3 g/dL — ABNORMAL LOW (ref 13.0–17.0)
MCH: 29 pg (ref 26.0–34.0)
MCHC: 31.8 g/dL (ref 30.0–36.0)
MCV: 91 fL (ref 80.0–100.0)
Platelets: 176 10*3/uL (ref 150–400)
RBC: 3.21 MIL/uL — ABNORMAL LOW (ref 4.22–5.81)
RDW: 15.9 % — ABNORMAL HIGH (ref 11.5–15.5)
WBC: 5 10*3/uL (ref 4.0–10.5)
nRBC: 0 % (ref 0.0–0.2)

## 2019-09-06 LAB — GLUCOSE, CAPILLARY
Glucose-Capillary: 90 mg/dL (ref 70–99)
Glucose-Capillary: 115 mg/dL — ABNORMAL HIGH (ref 70–99)

## 2019-09-06 MED ORDER — SACUBITRIL-VALSARTAN 49-51 MG PO TABS: 1.0000 | ORAL_TABLET | Freq: Two times a day (BID) | ORAL | 6 refills | Status: DC

## 2019-09-06 MED ORDER — ATORVASTATIN CALCIUM 40 MG PO TABS: 40.0000 mg | ORAL_TABLET | Freq: Every day | ORAL | 6 refills | Status: DC

## 2019-09-06 MED ORDER — APIXABAN 2.5 MG PO TABS: 2.5000 mg | ORAL_TABLET | Freq: Two times a day (BID) | ORAL | 6 refills | Status: DC

## 2019-09-06 MED ORDER — SPIRONOLACTONE 25 MG PO TABS: 25.0000 mg | ORAL_TABLET | Freq: Every day | ORAL | 6 refills | Status: DC

## 2019-09-06 MED ORDER — DIGOXIN 125 MCG PO TABS: 0.0625 mg | ORAL_TABLET | Freq: Every day | ORAL | 6 refills | Status: DC

## 2019-09-06 MED ORDER — AMIODARONE HCL 200 MG PO TABS: 200.0000 mg | ORAL_TABLET | Freq: Every day | ORAL | 6 refills | Status: DC

## 2019-09-06 MED ORDER — MEXILETINE HCL 150 MG PO CAPS: 300.0000 mg | ORAL_CAPSULE | Freq: Two times a day (BID) | ORAL | 6 refills | Status: DC

## 2019-09-06 MED ORDER — APIXABAN 2.5 MG PO TABS
2.5000 mg | ORAL_TABLET | Freq: Two times a day (BID) | ORAL | Status: DC
  Administered 2019-09-06: 2.5 mg via ORAL
  Filled 2019-09-06: qty 1

## 2019-09-06 MED ORDER — AMIODARONE HCL 200 MG PO TABS: ORAL_TABLET | ORAL | 6 refills | Status: DC

## 2019-09-06 MED ORDER — ISOSORB DINITRATE-HYDRALAZINE 20-37.5 MG PO TABS: 1.0000 | ORAL_TABLET | Freq: Three times a day (TID) | ORAL | 6 refills | Status: DC

## 2019-09-06 MED ORDER — FUROSEMIDE 20 MG PO TABS: 20.0000 mg | ORAL_TABLET | ORAL | 3 refills | Status: DC | PRN

## 2019-09-06 NOTE — Progress Notes (Signed)
Removed central line on Lt. IJ. Patient stayed on the bed for 30 min. Removed PIV access x 1. Patient need to wear the life vest, but patient didn't understand well. Talked pt's niece Blanch [employer] Day regarding life vest, but she seemed not sure of life vest. Talked Life vest rep Zenia Resides and requested for reeducation of Life Vest. They will come out to pt's house for education tomorrow or next day. Patient wore Life vest and took his all belongings. HS [employer]

## 2019-09-06 NOTE — Progress Notes (Signed)
Physical Therapy Treatment Patient Details Name: [name] [name] MRN: [id number] DOB: 08-02-1935 Today's Date: 09/06/2019    History of Present Illness 84 y/o male with ho. DM2, HTN. HL, CAD, chronic AF and CKD 3a (baseline creatinine 1.3-1.8) transferred from [facility] fur further management of cardiogenic shock in setting of atrial flutter/tach with RVR.    PT Comments    Patient seen for mobility progression. Pt presents with more unsteady gait compared to previous session and  requires min guard/min A for mobility using RW. Pt tolerated mobility well with VSS.  Recommend 24 hour supervision/assistance initially upon d/c.    Follow Up Recommendations  No PT follow up; 24 hour supervision/assistance      Equipment Recommendations  None recommended by PT(reports having RW at home)    Recommendations for Other Services       Precautions / Restrictions Precautions Precautions: Fall Restrictions Weight Bearing Restrictions: No    Mobility  Bed Mobility Overal bed mobility: Independent                Transfers Overall transfer level: Modified independent Equipment used: Rolling walker (2 wheeled)                Ambulation/Gait Ambulation/Gait assistance: Min guard;Min assist Gait Distance (Feet): 600 Feet Assistive device: Rolling walker (2 wheeled);None Gait Pattern/deviations: Step-through pattern     General Gait Details: assist required for balance and cues for safe use of AD especially with turns/directional changes   Stairs             Wheelchair Mobility    Modified Rankin (Stroke Patients Only)       Balance Overall balance assessment: Mild deficits observed, not formally tested                                          Cognition Arousal/Alertness: Awake/alert Behavior During Therapy: WFL for tasks assessed/performed Overall Cognitive Status: Within Functional Limits for tasks assessed                                  General Comments: decreased safety awareness at times when navigating to and while in bathroom especially      Exercises      General Comments        Pertinent Vitals/Pain Pain Assessment: No/denies pain    Home Living                      Prior Function            PT Goals (current goals can now be found in the care plan section) Acute Rehab PT Goals Patient Stated Goal: To go home Progress towards PT goals: Progressing toward goals    Frequency    Min 3X/week      PT Plan Current plan remains appropriate    Co-evaluation              AM-PAC PT "6 Clicks" Mobility   Outcome Measure  Help needed turning from your back to your side while in a flat bed without using bedrails?: None Help needed moving from lying on your back to sitting on the side of a flat bed without using bedrails?: None Help needed moving to and from a bed to a chair (including a wheelchair)?: None Help needed  standing up from a chair using your arms (e.g., wheelchair or bedside chair)?: None Help needed to walk in hospital room?: A Little Help needed climbing 3-5 steps with a railing? : None 6 Click Score: 23    End of Session Equipment Utilized During Treatment: Gait belt Activity Tolerance: Patient tolerated treatment well Patient left: with call bell/phone within reach;in bed;with bed alarm set Nurse Communication: Mobility status PT Visit Diagnosis: Other abnormalities of gait and mobility (R26.89)     Time: 1224-4975 PT Time Calculation (min) (ACUTE ONLY): 22 min  Charges:  $Gait Training: 8-22 mins                     Earney Navy, PTA Acute Rehabilitation Services Pager: [phone] Office: [phone]     Darliss Cheney 09/06/2019, 12:48 PM

## 2019-09-06 NOTE — Progress Notes (Signed)
CARDIAC REHAB PHASE I   PRE:  Rate/Rhythm: 47 SB  BP:  Supine: 155/55  Sitting:   Standing:    SaO2: 96%RA  MODE:  Ambulation: 700 ft   POST:  Rate/Rhythm: 69 SR  BP:  Supine:   Sitting: 150/54  Standing:    SaO2: 96%RA 0920-0950 Pt walked 700 ft on RA with rolling walker and minimal asst. To recliner with call bell. Tolerated well. Has walker at home.   Graylon Good, RN BSN  09/06/2019 9:46 AM

## 2019-09-06 NOTE — Progress Notes (Addendum)
Telemetry reviewed SB 40's generally some faster No PVCs noted, no VT D/w Dr. Haroldine Laws, given bradycardia he stopped dig and reduced his amio to 200mg  daily Dr. Curt Bears aware   Lifevest to be fitted prior to discharge, please call if you would like [place] to arrange this  EP will sign off otherwise though remain available Please recall if needed  Tommye Standard, PA-C

## 2019-09-11 ENCOUNTER — Other Ambulatory Visit (HOSPITAL_COMMUNITY): Payer: Self-pay | Admitting: *Deleted

## 2019-09-11 ENCOUNTER — Telehealth (HOSPITAL_COMMUNITY): Payer: Self-pay | Admitting: *Deleted

## 2019-09-11 DIAGNOSIS — I5042 Chronic combined systolic (congestive) and diastolic (congestive) heart failure: Secondary | ICD-10-CM

## 2019-09-11 NOTE — Telephone Encounter (Signed)
Received a message from Carylon Perches  that patients daughter called concerned because home health had not contacted patient. Pt has never been seen in our office but was seen by Dr.Bensimhon during his hospital admission. There wasn't an order for home health from that admission.  After reviewing patients chart with Kevan Rosebush, RN I placed an order for [facility] with [facility]. I called pts daughter back at the number she gave Molly [phone]) and received a busy tone. (tried # 4 times). I then called the patient directly to tell him an order for home health has been placed and they will contact him to schedule initial home visit. Patient did not answer and there was no voicemail ([phone]).

## 2019-09-11 NOTE — Telephone Encounter (Signed)
Received a call from [facility] stating they do not service [place].  New referral placed with [facility] home health.

## 2019-09-12 ENCOUNTER — Telehealth (HOSPITAL_COMMUNITY): Payer: Self-pay | Admitting: Cardiology

## 2019-09-12 NOTE — Telephone Encounter (Signed)
Patients caregiver called to request update on [facility] order, reports she has not heard anything  Advised order was placed 09/11/19 [facility] [place] [facility] did not service his area and second referral was made last in the afternoon ([place]). Normally HH will reach out to patient to schedule a assessment/intake visit within 24-48 hours of receiving referral   Patients caregiver appreciative and will await call from [facility]

## 2019-09-18 ENCOUNTER — Telehealth (HOSPITAL_COMMUNITY): Payer: Self-pay | Admitting: *Deleted

## 2019-09-18 NOTE — Telephone Encounter (Signed)
Linda called LV[facility] line requesting we send a hand written letter to [facility]. She said they wont see patient without it .  I called her back with Molly (LVAD coordinator) at [phone]. Bonita Quin did not answer I left a VM requesting she return my call. Also called [facility] health at [phone] to see what they needed from our office. I was told they don't service [place] but she would connect me to the [facility] office. I spoke with their office and they said they dont have a nurse that services that area and they dont have any record of him (no telephone note, chart started, etc.)  I then called the [place] office at [phone] again they did not have any record of patient but would call me back to begin referral process if needed. I then called Bonita Quin to get more information at [phone] and again she did not answer/left vm for her to return my call.

## 2019-09-20 ENCOUNTER — Other Ambulatory Visit: Payer: Self-pay

## 2019-09-20 ENCOUNTER — Ambulatory Visit (HOSPITAL_COMMUNITY)
Admission: RE | Admit: 2019-09-20 | Discharge: 2019-09-20 | Disposition: A | Discharge status: Home / Self Care | Payer: Medicare Other | Source: Ambulatory Visit | Attending: Cardiology | Admitting: Cardiology

## 2019-09-20 VITALS — BP 130/62 | HR 61 | Wt 150.4 lb

## 2019-09-20 DIAGNOSIS — I472 Ventricular tachycardia, unspecified: Secondary | ICD-10-CM

## 2019-09-20 DIAGNOSIS — Z7984 Long term (current) use of oral hypoglycemic drugs: Secondary | ICD-10-CM | POA: Insufficient documentation

## 2019-09-20 DIAGNOSIS — N183 Chronic kidney disease, stage 3 unspecified: Secondary | ICD-10-CM | POA: Insufficient documentation

## 2019-09-20 DIAGNOSIS — I2510 Atherosclerotic heart disease of native coronary artery without angina pectoris: Secondary | ICD-10-CM | POA: Insufficient documentation

## 2019-09-20 DIAGNOSIS — I428 Other cardiomyopathies: Secondary | ICD-10-CM | POA: Insufficient documentation

## 2019-09-20 DIAGNOSIS — E7800 Pure hypercholesterolemia, unspecified: Secondary | ICD-10-CM | POA: Diagnosis not present

## 2019-09-20 DIAGNOSIS — I4891 Unspecified atrial fibrillation: Secondary | ICD-10-CM | POA: Insufficient documentation

## 2019-09-20 DIAGNOSIS — E785 Hyperlipidemia, unspecified: Secondary | ICD-10-CM | POA: Insufficient documentation

## 2019-09-20 DIAGNOSIS — I5022 Chronic systolic (congestive) heart failure: Secondary | ICD-10-CM

## 2019-09-20 DIAGNOSIS — Z87891 Personal history of nicotine dependence: Secondary | ICD-10-CM | POA: Diagnosis not present

## 2019-09-20 DIAGNOSIS — I252 Old myocardial infarction: Secondary | ICD-10-CM | POA: Diagnosis not present

## 2019-09-20 DIAGNOSIS — J449 Chronic obstructive pulmonary disease, unspecified: Secondary | ICD-10-CM | POA: Diagnosis not present

## 2019-09-20 DIAGNOSIS — I130 Hypertensive heart and chronic kidney disease with heart failure and stage 1 through stage 4 chronic kidney disease, or unspecified chronic kidney disease: Secondary | ICD-10-CM | POA: Insufficient documentation

## 2019-09-20 DIAGNOSIS — Z7901 Long term (current) use of anticoagulants: Secondary | ICD-10-CM | POA: Diagnosis not present

## 2019-09-20 DIAGNOSIS — I493 Ventricular premature depolarization: Secondary | ICD-10-CM | POA: Diagnosis not present

## 2019-09-20 DIAGNOSIS — N1832 Chronic kidney disease, stage 3b: Secondary | ICD-10-CM

## 2019-09-20 DIAGNOSIS — Z79899 Other long term (current) drug therapy: Secondary | ICD-10-CM | POA: Insufficient documentation

## 2019-09-20 DIAGNOSIS — E1122 Type 2 diabetes mellitus with diabetic chronic kidney disease: Secondary | ICD-10-CM | POA: Diagnosis not present

## 2019-09-20 LAB — BASIC METABOLIC PANEL
Anion gap: 7 (ref 5–15)
BUN: 10 mg/dL (ref 8–23)
CO2: 22 mmol/L (ref 22–32)
Calcium: 9.5 mg/dL (ref 8.9–10.3)
Chloride: 111 mmol/L (ref 98–111)
Creatinine, Ser: 2.01 mg/dL — ABNORMAL HIGH (ref 0.61–1.24)
GFR calc Af Amer: 35 mL/min — ABNORMAL LOW (ref >60)
GFR calc non Af Amer: 30 mL/min — ABNORMAL LOW (ref >60)
Glucose, Bld: 117 mg/dL — ABNORMAL HIGH (ref 70–99)
Potassium: 4.5 mmol/L (ref 3.5–5.1)
Sodium: 140 mmol/L (ref 135–145)

## 2019-09-20 LAB — BRAIN NATRIURETIC PEPTIDE: B Natriuretic Peptide: 28.5 pg/mL (ref 0.0–100.0)

## 2019-09-20 LAB — MAGNESIUM: Magnesium: 1.9 mg/dL (ref 1.7–2.4)

## 2019-09-20 NOTE — Progress Notes (Signed)
[facility] could not accept patient they sent him to [facility]. [facility] said they did not service that area. Urgent order faxed to [facility] at [phone]

## 2019-09-20 NOTE — Progress Notes (Signed)
CSW consulted to help pt find PCP office.  CSW able to email list to pt niece to review and follow up.  CSW will continue to follow and assist as needed  Burna Sis, LCSW Clinical Social Worker [facility] Desk#: [phone] Cell#: [phone]

## 2019-09-20 NOTE — Patient Instructions (Signed)
Take Glipizide 10mg  daily.  Take Lasix 20mg  today and tomorrow.  Follow up in 3 weeks with Amy.  Follow up with Dr.Bensimhon next month.  Social work will contact you to assist with finding you a primary care provider.  You have been referred to home health. They will contact you to schedule visit.

## 2019-09-20 NOTE — Progress Notes (Signed)
PCP: None  Primary HF Cardiologist: Dr [name]  HPI: [name] [name] is an 85 year old with history of DM2, HTN. HL, CAD, chronic AF and CKD 3 (1.7) .   Went to [facility] ER on 08/28/19 and found to be AFL/AF with RVR with rates in 190s. Reportedly was hypothermic with temp of 88 degrees and smelled like kerosene.Given 25mg  of IV diltiazem in ER and became markedly hypotensive. Underwent attempted DC-CV without effect. Felt to have code sepsis as well. Started on NE and vanc/cefipime. As he improved pressors weaned off. ECHO was completed and showed EF 25-30%. Had RHC/LHC with nonobstructive CAD and well compensated hemodynamics. EP consulted for VT and frequent PVCs. Loaded on amio drip and due to ongoing PVCs mexiletene was added. Discharged with Life Vest.   Today he returns for HF follow up with his niece. Overall feeling ok but does get tired walking in his house. SOB walking in his house. Only walking in the house. Over the last few days having cough at night. + Orthopnea. Denies PND. Appetite poor. No fever or chills. He has not been weighing at home. Taking all medications. Living with his sister for now. His niece has been placing his medications in a pill box. Since discharge he did not continue his glipizide because they were not sure he should continue. He does not know if he has a PCP.   ECHO 08/29/19 EF 25-30% with global HK. LVOT VTI 10 cm which equates to CO/CI of 2.0 L/min and 1.0 L/min/m2.   RHC/LHC 09/03/19   Ost LAD to Prox LAD lesion is 40% stenosed.  2nd Diag lesion is 60% stenosed.  Dist Cx lesion is 70% stenosed.  1st Mrg lesion is 30% stenosed.  Ost RCA to Prox RCA lesion is 30% stenosed.  RPAV lesion is 40% stenosed.  3rd RPL lesion is 60% stenosed. Findings: Ao = 138/47 (72) LV = 136/2 RA = 4 RV = 29/1 PA = 33/4 (11) PCW = 10 (v = 25) Fick cardiac output/index = 5.7/3.0 PVR = < 1.0 WU Ao sat = 99% PA sat = 66%, 67% Assessment: 1. Non-obstructive  CAD 2. Severe NICM with EF 25% by echo 3. VT and frequent PVCs 4. Well-compensated hemodynamics  ROS: All systems negative except as listed in HPI, PMH and Problem List.  SH:  Social History   Socioeconomic History  . Marital status: Single    Spouse name: Not on file  . Number of children: Not on file  . Years of education: Not on file  . Highest education level: Not on file  Occupational History  . Not on file  Tobacco Use  . Smoking status: Former Smoker    Packs/day: 0.25    Years: 50.00    Pack years: 12.50    Types: Cigarettes  . Smokeless tobacco: Current User    Types: Chew  Substance and Sexual Activity  . Alcohol use: No  . Drug use: No  . Sexual activity: Not on file  Other Topics Concern  . Not on file  Social History Narrative  . Not on file   Social Determinants of Health   Financial Resource Strain:   . Difficulty of Paying Living Expenses: Not on file  Food Insecurity:   . Worried About 09/05/19 in the Last Year: Not on file  . Ran Out of Food in the Last Year: Not on file  Transportation Needs:   . Lack of Transportation (Medical): Not on file  .  Lack of Transportation (Non-Medical): Not on file  Physical Activity:   . Days of Exercise per Week: Not on file  . Minutes of Exercise per Session: Not on file  Stress:   . Feeling of Stress : Not on file  Social Connections:   . Frequency of Communication with Friends and Family: Not on file  . Frequency of Social Gatherings with Friends and Family: Not on file  . Attends Religious Services: Not on file  . Active Member of Clubs or Organizations: Not on file  . Attends Banker Meetings: Not on file  . Marital Status: Not on file  Intimate Partner Violence:   . Fear of Current or Ex-Partner: Not on file  . Emotionally Abused: Not on file  . Physically Abused: Not on file  . Sexually Abused: Not on file    FH: No family history on file.  Past Medical History:   Diagnosis Date  . Arthritis    shoulder  . Asthma   . Atrial fibrillation (HCC)   . Chronic kidney disease    stage III  . COPD (chronic obstructive pulmonary disease) (HCC)   . Coronary artery disease   . Diabetes mellitus without complication (HCC)    type 2  . Dysrhythmia    [facility] HEART IRREGULARLY IRREGULAR  . Full dentures    upper and lower  . Hypercholesteremia   . Hypertension   . Myocardial infarction (HCC)   . Shortness of breath dyspnea   . Swelling    FEET AND LEGS    Current Outpatient Medications  Medication Sig Dispense Refill  . amiodarone (PACERONE) 200 MG tablet Take 1 tablet (200 mg total) by mouth daily. 30 tablet 6  . apixaban (ELIQUIS) 2.5 MG TABS tablet Take 1 tablet (2.5 mg total) by mouth 2 (two) times daily. 60 tablet 6  . atorvastatin (LIPITOR) 40 MG tablet Take 1 tablet (40 mg total) by mouth daily at 6 PM. 30 tablet 6  . furosemide (LASIX) 20 MG tablet Take 1 tablet (20 mg total) by mouth as needed. 30 tablet 3  . isosorbide-hydrALAZINE (BIDIL) 20-37.5 MG tablet Take 1 tablet by mouth 3 (three) times daily. 90 tablet 6  . mexiletine (MEXITIL) 150 MG capsule Take 2 capsules (300 mg total) by mouth every 12 (twelve) hours. 60 capsule 6  . sacubitril-valsartan (ENTRESTO) 49-51 MG Take 1 tablet by mouth 2 (two) times daily. 60 tablet 6  . spironolactone (ALDACTONE) 25 MG tablet Take 1 tablet (25 mg total) by mouth daily. 30 tablet 6  . allopurinol (ZYLOPRIM) 100 MG tablet Take 100 mg by mouth daily.    Marland Kitchen GLIPIZIDE XL 10 MG 24 hr tablet Take 10 mg by mouth daily.     No current facility-administered medications for this encounter.    Vitals:   09/20/19 1132  BP: 130/62  Pulse: 61  SpO2: 99%  Weight: 68.2 kg   Vitals:   09/20/19 1132  BP: 130/62  Pulse: 61  SpO2: 99%   Wt Readings from Last 3 Encounters:  09/20/19 68.2 kg  09/06/19 69.7 kg  08/28/19 77.1 kg    PHYSICAL EXAM: General:  Thin Elderly AA.  No resp  difficulty. In wheel chair.  HEENT: normal Neck: supple. JVP 9-10. . Carotids 2+ bilaterally; no bruits. No lymphadenopathy or thryomegaly appreciated. Cor: PMI normal. Regular rate & rhythm. No rubs, gallops or murmurs. Life Vest in place  Lungs: clear Abdomen: soft, nontender, nondistended. No hepatosplenomegaly. No  bruits or masses. Good bowel sounds. Extremities: no cyanosis, clubbing, rash, edema Neuro: alert & orientedx3, cranial nerves grossly intact. Moves all 4 extremities w/o difficulty. Affect pleasant.   ECG: Sinus Brady 59 bpm QRS 76 ms    ASSESSMENT & PLAN: 1. Chronic Systolic HF with severe biventricular dysfunction  -Recent cardiogenic shock  - 08/2019 Echo with EF 25% with severe RV dysfunction - Suspect VT due to low EF (not vice versa) - NYHA III. Volume status mild elevated. Instructed to take 20 mg lasix for the next 2 days. Instructed to weigh and record daily.  - No bb for now.  -  Continue digoxin 0.0625 mg daily.  - Continue spiro 25 daily  - Continue Entresto 49/51 bid - Continue Bidil 1 tab tid - Check BMET, BNP  - Plan on repeating ECHO in 3 months.   2. VT  - 08/2019 -  Continue amio and mexiletine 300 twice a day  . .  -Plan to continue Life Vest. He has follow up with EP in February.  -For possible ICD down the road.    3. CAD - LHC 09/03/19 Nonobstructive CAD. - No s/s ischemia  -Continue ASA - Continue atorvastatin 40 mg daily.  4. CKD3 - baseline creatinine 1.7  - Check BMET today.   5. DM2 - hgb a1c 7.0 - Restart glipizide 10 mg daily. Consider SGLt2i next visit.  - need Follow up with PCP. Referred to HFSW to help obtain PCP   Follow up in 2-3 weeks.   Darral Rishel  NP-C  2:11 PM

## 2019-09-24 ENCOUNTER — Other Ambulatory Visit (HOSPITAL_COMMUNITY): Payer: Self-pay | Admitting: *Deleted

## 2019-09-24 ENCOUNTER — Other Ambulatory Visit (HOSPITAL_COMMUNITY): Payer: Self-pay | Admitting: Cardiology

## 2019-09-24 MED ORDER — GLIPIZIDE XL 10 MG PO TB24: 10.0000 mg | ORAL_TABLET | Freq: Every day | ORAL | 0 refills | Status: DC

## 2019-09-24 NOTE — Progress Notes (Signed)
Open in error

## 2019-09-28 ENCOUNTER — Telehealth (HOSPITAL_COMMUNITY): Payer: Self-pay | Admitting: Licensed Clinical Social Worker

## 2019-09-28 ENCOUNTER — Other Ambulatory Visit (HOSPITAL_COMMUNITY): Payer: Self-pay

## 2019-09-28 ENCOUNTER — Telehealth (HOSPITAL_COMMUNITY): Payer: Self-pay

## 2019-09-28 MED ORDER — GLIPIZIDE XL 10 MG PO TB24: 10.0000 mg | ORAL_TABLET | Freq: Every day | ORAL | 0 refills | Status: DC

## 2019-09-28 MED ORDER — FUROSEMIDE 20 MG PO TABS: 20.0000 mg | ORAL_TABLET | ORAL | 3 refills | Status: DC | PRN

## 2019-09-28 NOTE — Telephone Encounter (Signed)
CSW informed that daughter Bonita Quin stating she never got list of [facility] PCP offices- CSW confirmed that email was sent on 1/7 and resent to email address provided by Bonita Quin.  CSW then printed list and mailed to requested address.  CSW will continue to follow and assist as needed  Burna Sis, LCSW Clinical Social Worker [facility] Desk#: [phone] Cell#: [phone]

## 2019-09-28 NOTE — Telephone Encounter (Signed)
Received a phone call from [facility] patients caregiver stating that the patient needed a refill on his diabetes medication. She stated she was unaware of the name of it. I asked her what she referring to the glipizide and she said she doesn't know but she thinks that's it. She states that she has been calling for several week and was not able to speak with someone(according to patients chart several people have contacted her but she didn't answer nor callback) I advised her that it was sent in last [facility] she stated that was the wrong location and for me to [facility] [place](sent while she was on the phone with me).   She also stated that the patient has developed a cough since being seen and she wanted to know if we could call in something for his cough. Denies the patient had any weight gain nor swelling. I advised her that she would need to contact his pcp for his cough. She stated that the patient doesn't have a pcp at this time and our office was suppose to arrange for him to get a pcp([facility] note does state this). I advised her that I would look further what is going on with getting him a pcp and that I would also send a message to Dr. [name] to ask if there was something he could send in for the patients cough.   She also stated that she found it kind of strange that the patient was suppose to take lasix QD and that he was only suppose to take it prn. I advised her that, that wasn't strange at all,it varies by patient and that there are several different factors that could be the reason that the patient isn't taking it qd. I then reiterated that I would get her concerns to the right people and that we would hopefully contact her sometime later today with the next steps. Bonita Quin seemed to get slightly annoyed when I said that and asked what did I mean by hopefully?I tried to explain to her that Dr. [name] is not in office today and is on the floor working and he doesn't get to check  his messages on the computer often throughout the day.   Bonita Quin then stated that if she didn't hear anything back by the end of the day that she would come up here and talk to higher ups because she knows people and she doesn't think that is professional. I said ok and verified that the [phone] was the correct number for, she confirmed that it was.    Upon speaking with Melanie Crazier she stated that Bonita Quin did in fact speak with the social worker(Jena Uris) and was given a list of PCP's to choose from and to call to schedule an appointment with for [place].   After reviewing this case and its entirety with Dr. Prescott Gum Nurse, Meredith Staggers she advised that the patient should take the the lasix over the weekend(1 tab qd) and if he still has a cough on Monday he should be elevated at an urgent care.   I tried calling linda back at 11:59am. She did not answer. I left a message to return our call at [phone] and to choose option 2 to speak with the triage nurse. I also stated that if the triage nurse didn't answer to leave a voicemail and someone will return her call.

## 2019-09-28 NOTE — Telephone Encounter (Signed)
Called Bonita Quin again since I hadn't heard anything from her since I called her earlier. She answered and I advised her of what Dr. Volney American nurse, Meredith Staggers said(see last phone message). Bonita Quin verbalized understanding. I also asked her did she receive my message from when I contacted her earlier, she stated yeah but I didn't say anything except to callback. I asked did she call back and she said yeah but no one answered. I asked did she leave a message and she said yeah(according the triage line she did not call at all.) I also informed her that social work did indeed talk to her and gave her a list of pcps she denied speaking with social work and asked if a list of pcps could be mailed to her at [address] [place], [place] [address]. I told her I would deliver the message to social work.   Eileen Stanford can you please send of list of pcps to [facility] for this patient?

## 2019-10-01 ENCOUNTER — Other Ambulatory Visit (HOSPITAL_COMMUNITY): Payer: Self-pay | Admitting: Cardiology

## 2019-10-01 NOTE — Telephone Encounter (Signed)
Linda patients caregiver called to report rx for diabetes medications was never sent to pharmacy, patient still has cough, and never got a pcp list.  Reviewed multiple messages  rx for glipizide was sent 1/15, confirmed rx was received by pharmacy and however medication was on back order, will get script ready for patient now.   Reports she gave lasix daily through out the weekend and that seemed to help. Advised she could continue lasix daily as needed  Advised pcp list was emailed and mailed per Nolon Bussing reports she will check email shortly and arrange a new patient appt

## 2019-10-04 ENCOUNTER — Telehealth (HOSPITAL_COMMUNITY): Payer: Self-pay | Admitting: Cardiology

## 2019-10-04 MED ORDER — MAGNESIUM OXIDE -MG SUPPLEMENT 200 MG PO TABS: 200.0000 mg | ORAL_TABLET | Freq: Every day | ORAL | 3 refills | Status: DC

## 2019-10-04 NOTE — Telephone Encounter (Signed)
Per lab on  09/20/19 mag low and will require supplementation As ordered per Amy Cleeg,NP Start Mag Ox 200 mg daily Script sent

## 2019-10-05 ENCOUNTER — Inpatient Hospital Stay
Admission: EM | Admit: 2019-10-05 | Discharge: 2019-10-08 | DRG: 638 | Disposition: A | Discharge status: Home Health | Payer: Medicare Other | Attending: Hospitalist | Admitting: Hospitalist

## 2019-10-05 ENCOUNTER — Emergency Department: Payer: Medicare Other

## 2019-10-05 ENCOUNTER — Observation Stay: Payer: Medicare Other

## 2019-10-05 ENCOUNTER — Other Ambulatory Visit: Payer: Self-pay

## 2019-10-05 DIAGNOSIS — I248 Other forms of acute ischemic heart disease: Secondary | ICD-10-CM | POA: Diagnosis present

## 2019-10-05 DIAGNOSIS — I10 Essential (primary) hypertension: Secondary | ICD-10-CM | POA: Diagnosis present

## 2019-10-05 DIAGNOSIS — Z87891 Personal history of nicotine dependence: Secondary | ICD-10-CM

## 2019-10-05 DIAGNOSIS — I5042 Chronic combined systolic (congestive) and diastolic (congestive) heart failure: Secondary | ICD-10-CM | POA: Diagnosis present

## 2019-10-05 DIAGNOSIS — Z7901 Long term (current) use of anticoagulants: Secondary | ICD-10-CM

## 2019-10-05 DIAGNOSIS — E162 Hypoglycemia, unspecified: Secondary | ICD-10-CM | POA: Diagnosis not present

## 2019-10-05 DIAGNOSIS — E7800 Pure hypercholesterolemia, unspecified: Secondary | ICD-10-CM | POA: Diagnosis present

## 2019-10-05 DIAGNOSIS — N1831 Chronic kidney disease, stage 3a: Secondary | ICD-10-CM | POA: Diagnosis present

## 2019-10-05 DIAGNOSIS — Z79899 Other long term (current) drug therapy: Secondary | ICD-10-CM

## 2019-10-05 DIAGNOSIS — J449 Chronic obstructive pulmonary disease, unspecified: Secondary | ICD-10-CM | POA: Diagnosis present

## 2019-10-05 DIAGNOSIS — E11649 Type 2 diabetes mellitus with hypoglycemia without coma: Secondary | ICD-10-CM | POA: Diagnosis not present

## 2019-10-05 DIAGNOSIS — Z20822 Contact with and (suspected) exposure to covid-19: Secondary | ICD-10-CM | POA: Diagnosis present

## 2019-10-05 DIAGNOSIS — M109 Gout, unspecified: Secondary | ICD-10-CM | POA: Diagnosis present

## 2019-10-05 DIAGNOSIS — I482 Chronic atrial fibrillation, unspecified: Secondary | ICD-10-CM | POA: Diagnosis present

## 2019-10-05 DIAGNOSIS — I252 Old myocardial infarction: Secondary | ICD-10-CM

## 2019-10-05 DIAGNOSIS — R4189 Other symptoms and signs involving cognitive functions and awareness: Secondary | ICD-10-CM | POA: Diagnosis present

## 2019-10-05 DIAGNOSIS — R7989 Other specified abnormal findings of blood chemistry: Secondary | ICD-10-CM | POA: Diagnosis present

## 2019-10-05 DIAGNOSIS — R778 Other specified abnormalities of plasma proteins: Secondary | ICD-10-CM

## 2019-10-05 DIAGNOSIS — E1122 Type 2 diabetes mellitus with diabetic chronic kidney disease: Secondary | ICD-10-CM | POA: Diagnosis present

## 2019-10-05 DIAGNOSIS — I130 Hypertensive heart and chronic kidney disease with heart failure and stage 1 through stage 4 chronic kidney disease, or unspecified chronic kidney disease: Secondary | ICD-10-CM | POA: Diagnosis present

## 2019-10-05 DIAGNOSIS — Z7984 Long term (current) use of oral hypoglycemic drugs: Secondary | ICD-10-CM

## 2019-10-05 DIAGNOSIS — N183 Chronic kidney disease, stage 3 unspecified: Secondary | ICD-10-CM | POA: Diagnosis present

## 2019-10-05 DIAGNOSIS — E785 Hyperlipidemia, unspecified: Secondary | ICD-10-CM | POA: Diagnosis present

## 2019-10-05 DIAGNOSIS — I2510 Atherosclerotic heart disease of native coronary artery without angina pectoris: Secondary | ICD-10-CM | POA: Diagnosis present

## 2019-10-05 LAB — CBC WITH DIFFERENTIAL/PLATELET
Abs Immature Granulocytes: 0.05 10*3/uL (ref 0.00–0.07)
Basophils Absolute: 0 10*3/uL (ref 0.0–0.1)
Basophils Relative: 0 %
Eosinophils Absolute: 0.1 10*3/uL (ref 0.0–0.5)
Eosinophils Relative: 1 %
HCT: 44 % (ref 39.0–52.0)
Hemoglobin: 14.3 g/dL (ref 13.0–17.0)
Immature Granulocytes: 1 %
Lymphocytes Relative: 6 %
Lymphs Abs: 0.6 10*3/uL — ABNORMAL LOW (ref 0.7–4.0)
MCH: 28.2 pg (ref 26.0–34.0)
MCHC: 32.5 g/dL (ref 30.0–36.0)
MCV: 86.8 fL (ref 80.0–100.0)
Monocytes Absolute: 0.7 10*3/uL (ref 0.1–1.0)
Monocytes Relative: 8 %
Neutro Abs: 7.5 10*3/uL (ref 1.7–7.7)
Neutrophils Relative %: 84 %
Platelets: 238 10*3/uL (ref 150–400)
RBC: 5.07 MIL/uL (ref 4.22–5.81)
RDW: 15.2 % (ref 11.5–15.5)
WBC: 8.9 10*3/uL (ref 4.0–10.5)
nRBC: 0 % (ref 0.0–0.2)

## 2019-10-05 LAB — COMPREHENSIVE METABOLIC PANEL
ALT: 56 U/L — ABNORMAL HIGH (ref 0–44)
AST: 58 U/L — ABNORMAL HIGH (ref 15–41)
Albumin: 4.1 g/dL (ref 3.5–5.0)
Alkaline Phosphatase: 86 U/L (ref 38–126)
Anion gap: 13 (ref 5–15)
BUN: 13 mg/dL (ref 8–23)
CO2: 22 mmol/L (ref 22–32)
Calcium: 9.6 mg/dL (ref 8.9–10.3)
Chloride: 104 mmol/L (ref 98–111)
Creatinine, Ser: 1.44 mg/dL — ABNORMAL HIGH (ref 0.61–1.24)
GFR calc Af Amer: 52 mL/min — ABNORMAL LOW (ref >60)
GFR calc non Af Amer: 45 mL/min — ABNORMAL LOW (ref >60)
Glucose, Bld: 51 mg/dL — ABNORMAL LOW (ref 70–99)
Potassium: 4 mmol/L (ref 3.5–5.1)
Sodium: 139 mmol/L (ref 135–145)
Total Bilirubin: 0.6 mg/dL (ref 0.3–1.2)
Total Protein: 8.6 g/dL — ABNORMAL HIGH (ref 6.5–8.1)

## 2019-10-05 LAB — GLUCOSE, CAPILLARY
Glucose-Capillary: 100 mg/dL — ABNORMAL HIGH (ref 70–99)
Glucose-Capillary: 119 mg/dL — ABNORMAL HIGH (ref 70–99)
Glucose-Capillary: 128 mg/dL — ABNORMAL HIGH (ref 70–99)
Glucose-Capillary: 36 mg/dL — CL (ref 70–99)
Glucose-Capillary: 40 mg/dL — CL (ref 70–99)
Glucose-Capillary: 42 mg/dL — CL (ref 70–99)
Glucose-Capillary: 45 mg/dL — ABNORMAL LOW (ref 70–99)
Glucose-Capillary: 49 mg/dL — ABNORMAL LOW (ref 70–99)
Glucose-Capillary: 56 mg/dL — ABNORMAL LOW (ref 70–99)
Glucose-Capillary: 58 mg/dL — ABNORMAL LOW (ref 70–99)
Glucose-Capillary: 60 mg/dL — ABNORMAL LOW (ref 70–99)

## 2019-10-05 LAB — URINALYSIS, COMPLETE (UACMP) WITH MICROSCOPIC
Bilirubin Urine: NEGATIVE
Glucose, UA: 150 mg/dL — AB
Hgb urine dipstick: NEGATIVE
Ketones, ur: NEGATIVE mg/dL
Leukocytes,Ua: NEGATIVE
Nitrite: NEGATIVE
Protein, ur: 30 mg/dL — AB
Specific Gravity, Urine: 1.013 (ref 1.005–1.030)
pH: 5 (ref 5.0–8.0)

## 2019-10-05 LAB — TROPONIN I (HIGH SENSITIVITY)
Troponin I (High Sensitivity): 68 ng/L — ABNORMAL HIGH (ref <18)
Troponin I (High Sensitivity): 101 ng/L (ref <18)
Troponin I (High Sensitivity): 85 ng/L — ABNORMAL HIGH (ref <18)
Troponin I (High Sensitivity): 102 ng/L (ref <18)

## 2019-10-05 LAB — POC SARS CORONAVIRUS 2 AG: SARS Coronavirus 2 Ag: NEGATIVE

## 2019-10-05 LAB — LIPASE, BLOOD: Lipase: 31 U/L (ref 11–51)

## 2019-10-05 LAB — BRAIN NATRIURETIC PEPTIDE: B Natriuretic Peptide: 161 pg/mL — ABNORMAL HIGH (ref 0.0–100.0)

## 2019-10-05 LAB — ETHANOL: Alcohol, Ethyl (B): <10 mg/dL (ref <10)

## 2019-10-05 MED ORDER — APIXABAN 2.5 MG PO TABS
2.5000 mg | ORAL_TABLET | Freq: Two times a day (BID) | ORAL | Status: DC
  Administered 2019-10-05 – 2019-10-08 (×6): 2.5 mg via ORAL
  Filled 2019-10-05 (×6): qty 1

## 2019-10-05 MED ORDER — DEXTROSE 50 % IV SOLN
INTRAVENOUS | Status: AC
  Administered 2019-10-05: 50 mL via INTRAVENOUS
  Filled 2019-10-05: qty 50

## 2019-10-05 MED ORDER — MEXILETINE HCL 150 MG PO CAPS
300.0000 mg | ORAL_CAPSULE | Freq: Two times a day (BID) | ORAL | Status: DC
  Administered 2019-10-05 – 2019-10-08 (×6): 300 mg via ORAL
  Filled 2019-10-05 (×7): qty 2

## 2019-10-05 MED ORDER — DEXTROSE 10 % IV SOLN: INTRAVENOUS | Status: DC

## 2019-10-05 MED ORDER — MAGNESIUM OXIDE 400 (241.3 MG) MG PO TABS
200.0000 mg | ORAL_TABLET | Freq: Every day | ORAL | Status: DC
  Administered 2019-10-06 – 2019-10-08 (×3): 200 mg via ORAL
  Filled 2019-10-05 (×3): qty 1

## 2019-10-05 MED ORDER — DEXTROSE 50 % IV SOLN
INTRAVENOUS | Status: AC
  Filled 2019-10-05: qty 50

## 2019-10-05 MED ORDER — SPIRONOLACTONE 25 MG PO TABS
25.0000 mg | ORAL_TABLET | Freq: Every day | ORAL | Status: DC
  Administered 2019-10-05 – 2019-10-08 (×4): 25 mg via ORAL
  Filled 2019-10-05 (×4): qty 1

## 2019-10-05 MED ORDER — SACUBITRIL-VALSARTAN 49-51 MG PO TABS
1.0000 | ORAL_TABLET | Freq: Two times a day (BID) | ORAL | Status: DC
  Administered 2019-10-05 – 2019-10-08 (×6): 1 via ORAL
  Filled 2019-10-05 (×7): qty 1

## 2019-10-05 MED ORDER — AMIODARONE HCL 200 MG PO TABS
200.0000 mg | ORAL_TABLET | Freq: Every day | ORAL | Status: DC
  Administered 2019-10-05 – 2019-10-08 (×4): 200 mg via ORAL
  Filled 2019-10-05 (×4): qty 1

## 2019-10-05 MED ORDER — SODIUM CHLORIDE 0.9 % IV BOLUS
1000.0000 mL | Freq: Once | INTRAVENOUS | Status: AC
  Administered 2019-10-05: 1000 mL via INTRAVENOUS

## 2019-10-05 MED ORDER — HYDRALAZINE HCL 25 MG PO TABS: 25.0000 mg | ORAL_TABLET | Freq: Three times a day (TID) | ORAL | Status: DC | PRN

## 2019-10-05 MED ORDER — ATORVASTATIN CALCIUM 20 MG PO TABS
40.0000 mg | ORAL_TABLET | Freq: Every day | ORAL | Status: DC
  Administered 2019-10-05 – 2019-10-07 (×3): 40 mg via ORAL
  Filled 2019-10-05 (×2): qty 2

## 2019-10-05 MED ORDER — DEXTROSE 50 % IV SOLN
1.0000 | Freq: Once | INTRAVENOUS | Status: AC
  Administered 2019-10-05: 50 mL via INTRAVENOUS

## 2019-10-05 MED ORDER — DEXTROSE 50 % IV SOLN: 1.0000 | Freq: Once | INTRAVENOUS | Status: AC

## 2019-10-05 MED ORDER — DEXTROSE 50 % IV SOLN
50.0000 mL | INTRAVENOUS | Status: DC | PRN
  Administered 2019-10-05 – 2019-10-06 (×6): 50 mL via INTRAVENOUS
  Filled 2019-10-05 (×5): qty 50

## 2019-10-05 MED ORDER — ISOSORB DINITRATE-HYDRALAZINE 20-37.5 MG PO TABS
1.0000 | ORAL_TABLET | Freq: Three times a day (TID) | ORAL | Status: DC
  Administered 2019-10-05 – 2019-10-08 (×7): 1 via ORAL
  Filled 2019-10-05 (×10): qty 1

## 2019-10-05 MED ORDER — ALBUTEROL SULFATE (2.5 MG/3ML) 0.083% IN NEBU: 2.5000 mg | INHALATION_SOLUTION | RESPIRATORY_TRACT | Status: DC | PRN

## 2019-10-05 NOTE — ED Provider Notes (Signed)
[facility] Emergency Department Provider Note  ____________________________________________  Time seen: Approximately 1:23 PM  I have reviewed the triage vital signs and the nursing notes.   HISTORY  Chief Complaint Hypoglycemia    HPI [name] [name] is a 85 y.o. male with a history of atrial fibrillation, CKD, COPD, CAD, diabetes without insulin use, hypertension who comes the ED due to hypoglycemia.  Patient lives at home with family who called EMS today due to unresponsiveness.  They found the patient unconscious with a blood sugar of 38 and gave D50, increasing blood sugar to 160.  Patient does report a nonproductive cough but denies chest pain shortness of breath body aches chills fever sick contacts or recent illness.  He denies dysuria or other acute infectious symptoms.  Denies any overdose of medications.      Past Medical History:  Diagnosis Date  . Arthritis    shoulder  . Asthma   . Atrial fibrillation (HCC)   . Chronic kidney disease    stage III  . COPD (chronic obstructive pulmonary disease) (HCC)   . Coronary artery disease   . Diabetes mellitus without complication (HCC)    type 2  . Dysrhythmia    [facility] HEART IRREGULARLY IRREGULAR  . Full dentures    upper and lower  . Hypercholesteremia   . Hypertension   . Myocardial infarction (HCC)   . Shortness of breath dyspnea   . Swelling    FEET AND LEGS     Patient Active Problem List   Diagnosis Date Noted  . Malnutrition of moderate degree 08/30/2019  . Cardiogenic shock (HCC) 08/29/2019  . Acute systolic heart failure (HCC) 08/29/2019  . AKI (acute kidney injury) (HCC) 08/29/2019  . Atrial flutter (HCC) 08/29/2019  . Acute respiratory failure with hypoxia (HCC)   . Acute on chronic combined systolic and diastolic congestive heart failure [facility])      Past Surgical History:  Procedure Laterality Date  . CARDIAC CATHETERIZATION    . CATARACT EXTRACTION  W/PHACO Right 01/28/2016   Procedure: CATARACT EXTRACTION PHACO AND INTRAOCULAR LENS PLACEMENT (IOC) right eye;  Surgeon: Lockie Mola, MD;  Location: [facility];  Service: Ophthalmology;  Laterality: Right;  DIABETIC-oral med MALYUGIN  . RETINAL DETACHMENT SURGERY    . RIGHT/LEFT HEART CATH AND CORONARY ANGIOGRAPHY N/A 09/03/2019   Procedure: RIGHT/LEFT HEART CATH AND CORONARY ANGIOGRAPHY;  Surgeon: Dolores Patty, MD;  Location: [facility] INVASIVE CV LAB;  Service: Cardiovascular;  Laterality: N/A;     Prior to Admission medications   Medication Sig Start Date End Date Taking? Authorizing Provider  allopurinol (ZYLOPRIM) 100 MG tablet Take 100 mg by mouth daily.    [provider]  amiodarone (PACERONE) 200 MG tablet Take 1 tablet (200 mg total) by mouth daily. 09/06/19   Bensimhon, Bevelyn Buckles, MD  apixaban (ELIQUIS) 2.5 MG TABS tablet Take 1 tablet (2.5 mg total) by mouth 2 (two) times daily. 09/06/19   Bensimhon, Bevelyn Buckles, MD  atorvastatin (LIPITOR) 40 MG tablet Take 1 tablet (40 mg total) by mouth daily at 6 PM. 09/06/19   Bensimhon, Bevelyn Buckles, MD  furosemide (LASIX) 20 MG tablet Take 1 tablet (20 mg total) by mouth as needed. 09/28/19   Bensimhon, Bevelyn Buckles, MD  GLIPIZIDE XL 10 MG 24 hr tablet Take 1 tablet (10 mg total) by mouth daily. 09/28/19   Bensimhon, Bevelyn Buckles, MD  isosorbide-hydrALAZINE (BIDIL) 20-37.5 MG tablet Take 1 tablet by mouth 3 (three)  times daily. 09/06/19   Bensimhon, Bevelyn Buckles, MD  Magnesium Oxide 200 MG TABS Take 1 tablet (200 mg total) by mouth daily. 10/04/19   Clegg, Amy D, NP  mexiletine (MEXITIL) 150 MG capsule Take 2 capsules (300 mg total) by mouth every 12 (twelve) hours. 09/06/19   Bensimhon, Bevelyn Buckles, MD  sacubitril-valsartan (ENTRESTO) 49-51 MG Take 1 tablet by mouth 2 (two) times daily. 09/06/19   Bensimhon, Bevelyn Buckles, MD  spironolactone (ALDACTONE) 25 MG tablet Take 1 tablet (25 mg total) by mouth daily. 09/06/19   Bensimhon, Bevelyn Buckles, MD      Allergies Patient has no known allergies.   History reviewed. No pertinent family history.  Social History Social History   Tobacco Use  . Smoking status: Former Smoker    Packs/day: 0.25    Years: 50.00    Pack years: 12.50    Types: Cigarettes  . Smokeless tobacco: Current User    Types: Chew  Substance Use Topics  . Alcohol use: No  . Drug use: No    Review of Systems  Constitutional:   No fever or chills.  ENT:   No sore throat. No rhinorrhea. Cardiovascular:   No chest pain or syncope. Respiratory:   No dyspnea or cough. Gastrointestinal:   Negative for abdominal pain, vomiting and diarrhea.  Musculoskeletal:   Negative for focal pain or swelling All other systems reviewed and are negative except as documented above in ROS and HPI.  ____________________________________________   PHYSICAL EXAM:  VITAL SIGNS: ED Triage Vitals  Enc Vitals Group     BP 10/05/19 1300 (!) 129/95     Pulse Rate 10/05/19 1300 84     Resp 10/05/19 1300 13     Temp --      Temp src --      SpO2 10/05/19 1300 99 %     Weight 10/05/19 1251 149 lb (67.6 kg)     Height 10/05/19 1251 5\' 11"  (1.803 m)     Head Circumference --      Peak Flow --      Pain Score 10/05/19 1251 0     Pain Loc --      Pain Edu? --      Excl. in GC? --     Vital signs reviewed, nursing assessments reviewed.   Constitutional:   Alert and oriented. Non-toxic appearance. Eyes:   Conjunctivae are normal. EOMI. PERRL. ENT      Head:   Normocephalic and atraumatic.      Nose:   Wearing a mask.      Mouth/Throat:   Wearing a mask.      Neck:   No meningismus. Full ROM. Hematological/Lymphatic/Immunilogical:   No cervical lymphadenopathy. Cardiovascular:   RRR. Symmetric bilateral radial and DP pulses.  No murmurs. Cap refill less than 2 seconds. Respiratory:   Normal respiratory effort without tachypnea/retractions. Breath sounds are clear and equal bilaterally. No  wheezes/rales/rhonchi. Gastrointestinal:   Soft and nontender. Non distended. There is no CVA tenderness.  No rebound, rigidity, or guarding.  Musculoskeletal:   Normal range of motion in all extremities. No joint effusions.  No lower extremity tenderness.  No edema. Neurologic:   Normal speech and language.  Motor grossly intact. No acute focal neurologic deficits are appreciated.  Skin:    Skin is warm, dry and intact. No rash noted.  No petechiae, purpura, or bullae.  ____________________________________________    LABS (pertinent positives/negatives) (all labs ordered are listed, but only  abnormal results are displayed) Labs Reviewed  GLUCOSE, CAPILLARY - Abnormal; Notable for the following components:      Result Value   Glucose-Capillary 45 (*)    All other components within normal limits  URINE CULTURE  COMPREHENSIVE METABOLIC PANEL  ETHANOL  LIPASE, BLOOD  CBC WITH DIFFERENTIAL/PLATELET  URINALYSIS, COMPLETE (UACMP) WITH MICROSCOPIC  POC SARS CORONAVIRUS 2 AG -  ED  TROPONIN I (HIGH SENSITIVITY)   ____________________________________________   EKG    ____________________________________________    RADIOLOGY  No results found.  ____________________________________________   PROCEDURES .Critical Care Performed by: Sharman Cheek, MD Authorized by: Sharman Cheek, MD   Critical care provider statement:    Critical care time (minutes):  35   Critical care time was exclusive of:  Separately billable procedures and treating other patients   Critical care was necessary to treat or prevent imminent or life-threatening deterioration of the following conditions:  Endocrine crisis, metabolic crisis and CNS failure or compromise   Critical care was time spent personally by me on the following activities:  Development of treatment plan with patient or surrogate, discussions with consultants, evaluation of patient's response to treatment, examination of patient,  obtaining history from patient or surrogate, ordering and performing treatments and interventions, ordering and review of laboratory studies, ordering and review of radiographic studies, pulse oximetry, re-evaluation of patient's condition and review of old charts    ____________________________________________    CLINICAL IMPRESSION / ASSESSMENT AND PLAN / ED COURSE  Medications ordered in the ED: Medications  dextrose 10 % infusion ( Intravenous New Bag/Given 10/05/19 1311)  sodium chloride 0.9 % bolus 1,000 mL (1,000 mLs Intravenous New Bag/Given 10/05/19 1305)  dextrose 50 % solution 50 mL (50 mLs Intravenous Given 10/05/19 1304)    Pertinent labs & imaging results that were available during my care of the patient were reviewed by me and considered in my medical decision making (see chart for details).  [name] [name] was evaluated in Emergency Department on 10/05/2019 for the symptoms described in the history of present illness. He was evaluated in the context of the global COVID-19 pandemic, which necessitated consideration that the patient might be at risk for infection with the SARS-CoV-2 virus that causes COVID-19. Institutional protocols and algorithms that pertain to the evaluation of patients at risk for COVID-19 are in a state of rapid change based on information released by regulatory bodies including the CDC and federal and state organizations. These policies and algorithms were followed during the patient's care in the ED.     Clinical Course as of Oct 04 1321  Fri Oct 05, 2019  1300 Patient brought in by EMS due to being in a hypoglycemic coma at home with a blood sugar of 38.  He was given D50 which initially improved his blood sugar up to 160 for EMS.  On arrival to the ED, recheck of his blood sugar is 45.  We will repeat ampule of D50, started D10 drip.  Patient will need to be admitted.  Metabolic work-up with labs, Covid test, UA, chest x-ray.  Vital signs are  unremarkable, not septic on initial assessment.   [PS]    Clinical Course User Index [PS] Sharman Cheek, MD    ----------------------------------------- 2:40 PM on 10/05/2019 -----------------------------------------  Labs and chest x-ray all unremarkable.  Baseline CKD.  Discussed with hospitalist for further management.   ____________________________________________   FINAL CLINICAL IMPRESSION(S) / ED DIAGNOSES    Final diagnoses:  Hypoglycemia  Chronic kidney disease  ED Discharge Orders    None      Portions of this note were generated with dragon dictation software. Dictation errors may occur despite best attempts at proofreading.   Carrie Mew, MD 10/05/19 1440

## 2019-10-05 NOTE — Progress Notes (Signed)
Life Vest battery is completely dead. Defibrillator pads applied to patient by charge nurse. Will continue to monitor.

## 2019-10-05 NOTE — ED Notes (Signed)
This RN walked into pt's room to find pt's leg in between bed rails stating he had to urinate. This RN helped pt to urinate 5 mins earlier. This RN reinforced to pt to press call light in order for staff to help him use the restroom. Pt's linen changed, non skid socks on pt's feet and pt assisted up in bed. Call light in reach, bed low.

## 2019-10-05 NOTE — ED Triage Notes (Signed)
Pt arrived via [facility] from home with hypoglycemia and unresposive cbg was 38. after D50% from ems cbg was 160. wears external defibrillator, on waitlist for internal defib. low appetite, slowly coming back to baseline.

## 2019-10-05 NOTE — Progress Notes (Signed)
Patient FSBS 36, denies any symptoms of hypoglycemia at this time. PRN dextrose 50 % solution 50 mL administered at this time. Patient alert, will continue to monitor to ensure comfort and safety.

## 2019-10-05 NOTE — H&P (Signed)
History and Physical    RAJEEV ESCUE QQV:[id number] DOB: 1935/07/23 DOA: 10/05/2019  Referring MD/NP/PA:   PCP: Patient, No Pcp Per   Patient coming from:  The patient is coming from home.  At baseline, pt is independent for most of ADL.        Chief Complaint: Unresponsiveness, hypoglycemia  HPI: BAILY SERPE is a 85 y.o. male with medical history significant of hypertension, hyperlipidemia, diabetes mellitus, COPD, asthma, gout, CAD, myocardial infarction, atrial fibrillation on Eliquis, CHF with EF 25-30%, CKD-3, who presents with unresponsiveness and hypoglycemia.  Per report, pt became unresponsive names at home, and found to have blood sugar 38. Pt was given D50, blood sugar improved to 160, but then decreased to 45 in ED. D10 gtt was started in ED. His mental status has gradually improved.  When I saw patient in the ED, he is alert, oriented x3 answers all questions appropriately.  He does not have any chest pain, shortness of breath.  He has intermittent mild dry cough due to COPD.  No nausea vomiting, diarrhea, abdominal pain, symptoms of UTI or unilateral weakness.  Patient is wearing external defibrillator on waitlist.  ED Course: pt was found to have WBC 8.9, troponin 68, negative Covid Ag test, pending COVID-19 PCR, stable renal function, blood pressure 129/95, heart rate 84, RR 13, oxygen saturation 99% on 2 L nasal cannula oxygen.  Chest x-ray negative.  Patient is placed on MedSurg bed for observation.  Review of Systems:   General: no fevers, chills, no body weight gain, has fatigue HEENT: no blurry vision, hearing changes or sore throat Respiratory: no dyspnea, has dry coughing, no wheezing CV: no chest pain, no palpitations GI: no nausea, vomiting, abdominal pain, diarrhea, constipation GU: no dysuria, burning on urination, increased urinary frequency, hematuria  Ext: no leg edema Neuro: no unilateral weakness, numbness, or tingling, no vision change or hearing  loss.  Had unresponsiveness. Skin: no rash, no skin tear. MSK: No muscle spasm, no deformity, no limitation of range of movement in spin Heme: No easy bruising.  Travel history: No recent long distant travel.  Allergy: No Known Allergies  Past Medical History:  Diagnosis Date  . Arthritis    shoulder  . Asthma   . Atrial fibrillation ([place])   . Chronic kidney disease    stage III  . COPD (chronic obstructive pulmonary disease) ([place])   . Coronary artery disease   . Diabetes mellitus without complication ([place])    type 2  . Dysrhythmia    [facility] HEART IRREGULARLY IRREGULAR  . Full dentures    upper and lower  . Hypercholesteremia   . Hypertension   . Myocardial infarction ([place])   . Shortness of breath dyspnea   . Swelling    FEET AND LEGS    Past Surgical History:  Procedure Laterality Date  . CARDIAC CATHETERIZATION    . CATARACT EXTRACTION W/PHACO Right 01/28/2016   Procedure: CATARACT EXTRACTION PHACO AND INTRAOCULAR LENS PLACEMENT ([place]) right eye;  Surgeon: Leandrew Koyanagi, MD;  Location: [place];  Service: Ophthalmology;  Laterality: Right;  DIABETIC-oral med MALYUGIN  . RETINAL DETACHMENT SURGERY    . RIGHT/LEFT HEART CATH AND CORONARY ANGIOGRAPHY N/A 09/03/2019   Procedure: RIGHT/LEFT HEART CATH AND CORONARY ANGIOGRAPHY;  Surgeon: Jolaine Artist, MD;  Location: [place] [facility];  Service: Cardiovascular;  Laterality: N/A;    Social History:  reports that he has quit smoking. His smoking use included cigarettes. He has  a 12.50 pack-year smoking history. His smokeless tobacco use includes chew. He reports that he does not drink alcohol or use drugs.  Family History: History reviewed. No pertinent family history. tired to have reviewed patient, but patient cannot provide accurate family medical history.  Prior to Admission medications   Medication Sig Start Date End Date Taking? Authorizing Provider  allopurinol (ZYLOPRIM) 100 MG  tablet Take 100 mg by mouth daily.    [provider]  amiodarone (PACERONE) 200 MG tablet Take 1 tablet (200 mg total) by mouth daily. 09/06/19   Bensimhon, Bevelyn Buckles, MD  apixaban (ELIQUIS) 2.5 MG TABS tablet Take 1 tablet (2.5 mg total) by mouth 2 (two) times daily. 09/06/19   Bensimhon, Bevelyn Buckles, MD  atorvastatin (LIPITOR) 40 MG tablet Take 1 tablet (40 mg total) by mouth daily at 6 PM. 09/06/19   Bensimhon, Bevelyn Buckles, MD  furosemide (LASIX) 20 MG tablet Take 1 tablet (20 mg total) by mouth as needed. 09/28/19   Bensimhon, Bevelyn Buckles, MD  GLIPIZIDE XL 10 MG 24 hr tablet Take 1 tablet (10 mg total) by mouth daily. 09/28/19   Bensimhon, Bevelyn Buckles, MD  isosorbide-hydrALAZINE (BIDIL) 20-37.5 MG tablet Take 1 tablet by mouth 3 (three) times daily. 09/06/19   Bensimhon, Bevelyn Buckles, MD  Magnesium Oxide 200 MG TABS Take 1 tablet (200 mg total) by mouth daily. 10/04/19   Clegg, Amy D, NP  mexiletine (MEXITIL) 150 MG capsule Take 2 capsules (300 mg total) by mouth every 12 (twelve) hours. 09/06/19   Bensimhon, Bevelyn Buckles, MD  sacubitril-valsartan (ENTRESTO) 49-51 MG Take 1 tablet by mouth 2 (two) times daily. 09/06/19   Bensimhon, Bevelyn Buckles, MD  spironolactone (ALDACTONE) 25 MG tablet Take 1 tablet (25 mg total) by mouth daily. 09/06/19   Bensimhon, Bevelyn Buckles, MD    Physical Exam: Vitals:   10/05/19 1700 10/05/19 1715 10/05/19 1730 10/05/19 1800  BP: (!) 146/69  (!) 155/84 107/64  Pulse:  78  80  Resp: 14 12 14 12   Temp:      TempSrc:      SpO2:  98%  98%  Weight:      Height:       General: Not in acute distress HEENT:       Eyes: PERRL, EOMI, no scleral icterus.       ENT: No discharge from the ears and nose, no pharynx injection, no tonsillar enlargement.        Neck: No JVD, no bruit, no mass felt. Heme: No neck lymph node enlargement. Cardiac: S1/S2, RRR, No murmurs, No gallops or rubs. Respiratory: No rales, wheezing, rhonchi or rubs. GI: Soft, nondistended, nontender, no rebound pain, no  organomegaly, BS present. GU: No hematuria Ext: No pitting leg edema bilaterally. 2+DP/PT pulse bilaterally. Musculoskeletal: No joint deformities, No joint redness or warmth, no limitation of ROM in spin. Skin: No rashes.  Neuro: Currently, patient is alert, oriented X3, cranial nerves II-XII grossly intact, moves all extremities normally.  Psych: Patient is not psychotic, no suicidal or hemocidal ideation.  Labs on Admission: I have personally reviewed following labs and imaging studies  CBC: Recent Labs  Lab 10/05/19 1302  WBC 8.9  NEUTROABS 7.5  HGB 14.3  HCT 44.0  MCV 86.8  PLT 238   Basic Metabolic Panel: Recent Labs  Lab 10/05/19 1302  NA 139  K 4.0  CL 104  CO2 22  GLUCOSE 51*  BUN 13  CREATININE 1.44*  CALCIUM 9.6  GFR: Estimated Creatinine Clearance: 37.8 mL/min (A) (by C-G formula based on SCr of 1.44 mg/dL (H)). Liver Function Tests: Recent Labs  Lab 10/05/19 1302  AST 58*  ALT 56*  ALKPHOS 86  BILITOT 0.6  PROT 8.6*  ALBUMIN 4.1   Recent Labs  Lab 10/05/19 1302  LIPASE 31   No results for input(s): AMMONIA in the last 168 hours. Coagulation Profile: No results for input(s): INR, PROTIME in the last 168 hours. Cardiac Enzymes: No results for input(s): CKTOTAL, CKMB, CKMBINDEX, TROPONINI in the last 168 hours. BNP (last 3 results) No results for input(s): PROBNP in the last 8760 hours. HbA1C: No results for input(s): HGBA1C in the last 72 hours. CBG: Recent Labs  Lab 10/05/19 1259 10/05/19 1339 10/05/19 1506 10/05/19 1642 10/05/19 1732  GLUCAP 45* 128* 58* 60* 100*   Lipid Profile: No results for input(s): CHOL, HDL, LDLCALC, TRIG, CHOLHDL, LDLDIRECT in the last 72 hours. Thyroid Function Tests: No results for input(s): TSH, T4TOTAL, FREET4, T3FREE, THYROIDAB in the last 72 hours. Anemia Panel: No results for input(s): VITAMINB12, FOLATE, FERRITIN, TIBC, IRON, RETICCTPCT in the last 72 hours. Urine analysis:    Component Value  Date/Time   COLORURINE YELLOW (A) 10/05/2019 1429   APPEARANCEUR HAZY (A) 10/05/2019 1429   LABSPEC 1.013 10/05/2019 1429   PHURINE 5.0 10/05/2019 1429   GLUCOSEU 150 (A) 10/05/2019 1429   HGBUR NEGATIVE 10/05/2019 1429   BILIRUBINUR NEGATIVE 10/05/2019 1429   KETONESUR NEGATIVE 10/05/2019 1429   PROTEINUR 30 (A) 10/05/2019 1429   NITRITE NEGATIVE 10/05/2019 1429   LEUKOCYTESUR NEGATIVE 10/05/2019 1429   Sepsis Labs: @LABRCNTIP (procalcitonin:4,lacticidven:4) )No results found for this or any previous visit (from the past 240 hour(s)).   Radiological Exams on Admission: DG Chest Portable 1 View  Result Date: 10/05/2019 CLINICAL DATA:  Weakness, hypoglycemia. EXAM: PORTABLE CHEST 1 VIEW COMPARISON:  August 29, 2019. FINDINGS: The heart size and mediastinal contours are within normal limits. Both lungs are clear. Atherosclerosis of thoracic aorta is noted. The visualized skeletal structures are unremarkable. IMPRESSION: No active disease. Electronically Signed   By: August 31, 2019 M.D.   On: 10/05/2019 14:02     EKG: Independently reviewed.  ?  Accelerated junctional rhythm, QTC 601, poor quality of EKG strips   Assessment/Plan Principal Problem:   Hypoglycemia Active Problems:   Unresponsiveness   Atrial fibrillation, chronic (HCC)   Chronic combined systolic (congestive) and diastolic (congestive) heart failure (HCC)   Hypertension   Hypercholesteremia   COPD (chronic obstructive pulmonary disease) (HCC)   CKD (chronic kidney disease), stage IIIa   Elevated troponin   Unresponsiveness due to hypoglycemia: Patient's unresponsiveness is most likely due to hypoglycemia. His hypoglycemia is likely due to decreased oral intake and continuation of her glipizide.  Patient's mental status has already improved to the baseline with D10 drip treatment.  -Placed on MedSurg bed of observation -Frequent neuro check -Continue D10 drip at 75 cc/h -Check blood sugar level every  hour -D50 PCR -Will get CT head since patient is on Eliquis  Atrial fibrillation, chronic (HCC): -Continue Eliquis -Continue Mexitil and amiodarone  Chronic combined systolic (congestive) and diastolic (congestive) heart failure (HCC): 2D echo on 08/29/2019 showed EF 25-30% with grade 2 diastolic dysfunction.  Patient does not have leg edema or respiratory distress.  No pulmonary edema chest x-ray.  CHF seem to be compensated. -Continue Entresto, spironolactone, Bidil -Hold Lasix (pt is on prn lasix at home). -check BNP  HTN:  -Continue home medications -hydralazine  prn  Hypercholesteremia: -lipitgor  COPD (chronic obstructive pulmonary disease) (HCC): -Bronchodilators  CKD (chronic kidney disease), stage IIIa: Stable.  Baseline creatinine 1.5-1.7.  His creatinine is 1.44, BUN 13. - F/u by BMP  Elevated troponin: trop 68. No CP.  Possible demand ischemia. -Trend troponin -Check A1c, FLP, UDS -Repeat EKG in the morning -pt is on lipitor    DVT ppx: on Eliquis Code Status: Full code Family Communication: None at bed side Disposition Plan:  Anticipate discharge back to previous home environment Consults called:  none Admission status: Med-surg bed for obs  Date of Service 10/05/2019    Lorretta Harp [facility]   If 7PM-7AM, please contact night-coverage [email or web address] Password [facility] 10/05/2019, 6:26 PM

## 2019-10-05 NOTE — ED Notes (Signed)
ED TO INPATIENT HANDOFF REPORT  ED Nurse Name and Phone #: Karena Addison [phone]  P Name/Age/Gender [name] [name] 85 y.o. male Room/Bed: ED01A/ED01A  Code Status   Code Status: Prior  Home/SNF/Other Home Patient oriented to: self, place, time and situation Is this baseline? Yes   Triage Complete: Triage complete  Chief Complaint Hypoglycemia [E16.2]  Triage Note Pt arrived via [facility] from home with hypoglycemia and unresposive cbg was 38. after D50% from ems cbg was 160. wears external defibrillator, on waitlist for internal defib. low appetite, slowly coming back to baseline.     Allergies No Known Allergies  Level of Care/Admitting Diagnosis ED Disposition    ED Disposition Condition [facility] Area: [place] [[id number]]  Level of Care: Med-Surg [16]  Covid Evaluation: Asymptomatic Screening Protocol (No Symptoms)  Diagnosis: Hypoglycemia [379024]  Admitting Physician: Ivor Costa Wade  Attending Physician: Ivor Costa [[id number]]       B Medical/Surgery History Past Medical History:  Diagnosis Date  . Arthritis    shoulder  . Asthma   . Atrial fibrillation ([place])   . Chronic kidney disease    stage III  . COPD (chronic obstructive pulmonary disease) ([place])   . Coronary artery disease   . Diabetes mellitus without complication ([place])    type 2  . Dysrhythmia    [facility] HEART IRREGULARLY IRREGULAR  . Full dentures    upper and lower  . Hypercholesteremia   . Hypertension   . Myocardial infarction ([place])   . Shortness of breath dyspnea   . Swelling    FEET AND LEGS   Past Surgical History:  Procedure Laterality Date  . CARDIAC CATHETERIZATION    . CATARACT EXTRACTION W/PHACO Right 01/28/2016   Procedure: CATARACT EXTRACTION PHACO AND INTRAOCULAR LENS PLACEMENT ([place]) right eye;  Surgeon: Leandrew Koyanagi, MD;  Location: [place];  Service: Ophthalmology;  Laterality: Right;  DIABETIC-oral  med MALYUGIN  . RETINAL DETACHMENT SURGERY    . RIGHT/LEFT HEART CATH AND CORONARY ANGIOGRAPHY N/A 09/03/2019   Procedure: RIGHT/LEFT HEART CATH AND CORONARY ANGIOGRAPHY;  Surgeon: Jolaine Artist, MD;  Location: [place] [facility];  Service: Cardiovascular;  Laterality: N/A;     A IV Location/Drains/Wounds Patient Lines/Drains/Airways Status   Active Line/Drains/Airways    Name:   Placement date:   Placement time:   Site:   Days:   Peripheral IV 10/05/19 Right Wrist   10/05/19    1253    Wrist   less than 1   Peripheral IV 10/05/19 Right Antecubital   10/05/19    1304    Antecubital   less than 1          Intake/Output Last 24 hours No intake or output data in the 24 hours ending 10/05/19 1755  Labs/Imaging Results for orders placed or performed during the hospital encounter of 10/05/19 (from the past 48 hour(s))  Glucose, capillary     Status: Abnormal   Collection Time: 10/05/19 12:59 PM  Result Value Ref Range   Glucose-Capillary 45 (L) 70 - 99 mg/dL  Comprehensive metabolic panel     Status: Abnormal   Collection Time: 10/05/19  1:02 PM  Result Value Ref Range   Sodium 139 135 - 145 mmol/L   Potassium 4.0 3.5 - 5.1 mmol/L   Chloride 104 98 - 111 mmol/L   CO2 22 22 - 32 mmol/L   Glucose, Bld 51 (L) 70 - 99 mg/dL  BUN 13 8 - 23 mg/dL   Creatinine, Ser 7.82 (H) 0.61 - 1.24 mg/dL   Calcium 9.6 8.9 - 95.6 mg/dL   Total Protein 8.6 (H) 6.5 - 8.1 g/dL   Albumin 4.1 3.5 - 5.0 g/dL   AST 58 (H) 15 - 41 U/L   ALT 56 (H) 0 - 44 U/L   Alkaline Phosphatase 86 38 - 126 U/L   Total Bilirubin 0.6 0.3 - 1.2 mg/dL   GFR calc non Af Amer 45 (L) >60 mL/min   GFR calc Af Amer 52 (L) >60 mL/min   Anion gap 13 5 - 15    Comment: Performed at [facility], [address] [place], [place] [address]  Ethanol     Status: None   Collection Time: 10/05/19  1:02 PM  Result Value Ref Range   Alcohol, Ethyl (B) <10 <10 mg/dL    Comment: (NOTE) Lowest detectable limit for  serum alcohol is 10 mg/dL. For medical purposes only. Performed at [facility], [address] [place], [place] [address]   Lipase, blood     Status: None   Collection Time: 10/05/19  1:02 PM  Result Value Ref Range   Lipase 31 11 - 51 U/L    Comment: Performed at [facility], [address] [place], [place] [address]  CBC with Differential     Status: Abnormal   Collection Time: 10/05/19  1:02 PM  Result Value Ref Range   WBC 8.9 4.0 - 10.5 K/uL   RBC 5.07 4.22 - 5.81 MIL/uL   Hemoglobin 14.3 13.0 - 17.0 g/dL   HCT 52.8 41.3 - 24.4 %   MCV 86.8 80.0 - 100.0 fL   MCH 28.2 26.0 - 34.0 pg   MCHC 32.5 30.0 - 36.0 g/dL   RDW 01.0 27.2 - 53.6 %   Platelets 238 150 - 400 K/uL   nRBC 0.0 0.0 - 0.2 %   Neutrophils Relative % 84 %   Neutro Abs 7.5 1.7 - 7.7 K/uL   Lymphocytes Relative 6 %   Lymphs Abs 0.6 (L) 0.7 - 4.0 K/uL   Monocytes Relative 8 %   Monocytes Absolute 0.7 0.1 - 1.0 K/uL   Eosinophils Relative 1 %   Eosinophils Absolute 0.1 0.0 - 0.5 K/uL   Basophils Relative 0 %   Basophils Absolute 0.0 0.0 - 0.1 K/uL   Immature Granulocytes 1 %   Abs Immature Granulocytes 0.05 0.00 - 0.07 K/uL    Comment: Performed at [facility], [address] [place], [place] [address]  Troponin I (High Sensitivity)     Status: Abnormal   Collection Time: 10/05/19  1:02 PM  Result Value Ref Range   Troponin I (High Sensitivity) 68 (H) <18 ng/L    Comment: (NOTE) Elevated high sensitivity troponin I (hsTnI) values and significant  changes across serial measurements may suggest ACS but many other  chronic and acute conditions are known to elevate hsTnI results.  Refer to the "Links" section for chest pain algorithms and additional  guidance. Performed at [facility], [address] [place], [place] [address]   Glucose, capillary     Status: Abnormal   Collection Time: 10/05/19  1:39 PM  Result Value Ref Range   Glucose-Capillary 128 (H) 70  - 99 mg/dL  POC SARS Coronavirus 2 Ag     Status: None   Collection Time: 10/05/19  2:03 PM  Result Value Ref Range   SARS Coronavirus  2 Ag NEGATIVE NEGATIVE    Comment: (NOTE) SARS-CoV-2 antigen NOT DETECTED.  Negative results are presumptive.  Negative results do not preclude SARS-CoV-2 infection and should not be used as the sole basis for treatment or other patient management decisions, including infection  control decisions, particularly in the presence of clinical signs and  symptoms consistent with COVID-19, or in those who have been in contact with the virus.  Negative results must be combined with clinical observations, patient history, and epidemiological information. The expected result is Negative. Fact Sheet for Patients: https://sanders-williams.net/ Fact Sheet for Healthcare Providers: https://martinez.com/ This test is not yet approved or cleared by the [place] FDA and  has been authorized for detection and/or diagnosis of SARS-CoV-2 by FDA under an Emergency Use Authorization (EUA).  This EUA will remain in effect (meaning this test can be used) for the duration of  the COVID-19 de claration under Section 564(b)(1) of the Act, 21 U.S.C. section 360bbb-3(b)(1), unless the authorization is terminated or revoked sooner.   Urinalysis, Complete w Microscopic     Status: Abnormal   Collection Time: 10/05/19  2:29 PM  Result Value Ref Range   Color, Urine YELLOW (A) YELLOW   APPearance HAZY (A) CLEAR   Specific Gravity, Urine 1.013 1.005 - 1.030   pH 5.0 5.0 - 8.0   Glucose, UA 150 (A) NEGATIVE mg/dL   Hgb urine dipstick NEGATIVE NEGATIVE   Bilirubin Urine NEGATIVE NEGATIVE   Ketones, ur NEGATIVE NEGATIVE mg/dL   Protein, ur 30 (A) NEGATIVE mg/dL   Nitrite NEGATIVE NEGATIVE   Leukocytes,Ua NEGATIVE NEGATIVE   RBC / HPF 0-5 0 - 5 RBC/hpf   WBC, UA 0-5 0 - 5 WBC/hpf   Bacteria, UA RARE (A) NONE SEEN   Squamous Epithelial / LPF  6-10 0 - 5   Mucus PRESENT    Hyaline Casts, UA PRESENT     Comment: Performed at [facility], [address] [place], [place] [address]  Glucose, capillary     Status: Abnormal   Collection Time: 10/05/19  3:06 PM  Result Value Ref Range   Glucose-Capillary 58 (L) 70 - 99 mg/dL  Brain natriuretic peptide     Status: Abnormal   Collection Time: 10/05/19  3:49 PM  Result Value Ref Range   B Natriuretic Peptide 161.0 (H) 0.0 - 100.0 pg/mL    Comment: Performed at [facility], [address] [place], [place] [address]  Glucose, capillary     Status: Abnormal   Collection Time: 10/05/19  4:42 PM  Result Value Ref Range   Glucose-Capillary 60 (L) 70 - 99 mg/dL  Glucose, capillary     Status: Abnormal   Collection Time: 10/05/19  5:32 PM  Result Value Ref Range   Glucose-Capillary 100 (H) 70 - 99 mg/dL   DG Chest Portable 1 View  Result Date: 10/05/2019 CLINICAL DATA:  Weakness, hypoglycemia. EXAM: PORTABLE CHEST 1 VIEW COMPARISON:  August 29, 2019. FINDINGS: The heart size and mediastinal contours are within normal limits. Both lungs are clear. Atherosclerosis of thoracic aorta is noted. The visualized skeletal structures are unremarkable. IMPRESSION: No active disease. Electronically Signed   By: Lupita Raider M.D.   On: 10/05/2019 14:02    Pending Labs Unresulted Labs (From admission, onward)    Start     Ordered   10/05/19 1411  SARS CORONAVIRUS 2 (TAT 6-24 HRS) Nasopharyngeal Nasopharyngeal Swab  (Tier 3 (TAT 6-24 hrs))  ONCE - STAT,   STAT  Question Answer Comment  Is this test for diagnosis or screening Screening   Symptomatic for COVID-19 as defined by CDC No   Hospitalized for COVID-19 No   Admitted to ICU for COVID-19 No   Previously tested for COVID-19 Yes   Resident in a congregate (group) care setting No   Employed in healthcare setting No      10/05/19 1410   10/05/19 1259  Urine culture  Once,   STAT     10/05/19 1259   Signed and Held   Hemoglobin A1c  Tomorrow morning,   R     Signed and Held   Signed and Held  Lipid panel  Tomorrow morning,   R     Signed and Held   Signed and Held  Basic metabolic panel  Tomorrow morning,   R     Signed and Held   Signed and Held  CBC  Tomorrow morning,   R     Signed and Held          Vitals/Pain Today's Vitals   10/05/19 1428 10/05/19 1435 10/05/19 1437 10/05/19 1600  BP:  (!) 164/82  (!) 159/89  Pulse:   83 94  Resp:  18 16   Temp: 98.3 F (36.8 C)     TempSrc: Oral     SpO2:   100% 98%  Weight:      Height:      PainSc:        Isolation Precautions Airborne and Contact precautions  Medications Medications  dextrose 10 % infusion ( Intravenous Rate/Dose Change 10/05/19 1511)  dextrose 50 % solution 50 mL (50 mLs Intravenous Given 10/05/19 1647)  sodium chloride 0.9 % bolus 1,000 mL (0 mLs Intravenous Stopped 10/05/19 1442)  dextrose 50 % solution 50 mL (50 mLs Intravenous Given 10/05/19 1304)  dextrose 50 % solution 50 mL (50 mLs Intravenous Given 10/05/19 1511)    Mobility walks with person assist Low fall risk   Focused Assessments    R Recommendations: See Admitting Provider Note  Report given to:

## 2019-10-05 NOTE — Progress Notes (Signed)
Report called to [place] Rn on telemetry room 251. Pt transferred for closer monitoring as ordered. [place] aware opf low blood glucose IVF increased top 100 as ordered.

## 2019-10-06 DIAGNOSIS — I5042 Chronic combined systolic (congestive) and diastolic (congestive) heart failure: Secondary | ICD-10-CM | POA: Diagnosis present

## 2019-10-06 DIAGNOSIS — I252 Old myocardial infarction: Secondary | ICD-10-CM | POA: Diagnosis not present

## 2019-10-06 DIAGNOSIS — J449 Chronic obstructive pulmonary disease, unspecified: Secondary | ICD-10-CM | POA: Diagnosis present

## 2019-10-06 DIAGNOSIS — E1122 Type 2 diabetes mellitus with diabetic chronic kidney disease: Secondary | ICD-10-CM | POA: Diagnosis present

## 2019-10-06 DIAGNOSIS — E7800 Pure hypercholesterolemia, unspecified: Secondary | ICD-10-CM | POA: Diagnosis present

## 2019-10-06 DIAGNOSIS — E162 Hypoglycemia, unspecified: Secondary | ICD-10-CM | POA: Diagnosis present

## 2019-10-06 DIAGNOSIS — E11649 Type 2 diabetes mellitus with hypoglycemia without coma: Secondary | ICD-10-CM | POA: Diagnosis present

## 2019-10-06 DIAGNOSIS — Z87891 Personal history of nicotine dependence: Secondary | ICD-10-CM | POA: Diagnosis not present

## 2019-10-06 DIAGNOSIS — Z20822 Contact with and (suspected) exposure to covid-19: Secondary | ICD-10-CM | POA: Diagnosis present

## 2019-10-06 DIAGNOSIS — N1831 Chronic kidney disease, stage 3a: Secondary | ICD-10-CM | POA: Diagnosis present

## 2019-10-06 DIAGNOSIS — I248 Other forms of acute ischemic heart disease: Secondary | ICD-10-CM | POA: Diagnosis present

## 2019-10-06 DIAGNOSIS — R778 Other specified abnormalities of plasma proteins: Secondary | ICD-10-CM

## 2019-10-06 DIAGNOSIS — M109 Gout, unspecified: Secondary | ICD-10-CM | POA: Diagnosis present

## 2019-10-06 DIAGNOSIS — I130 Hypertensive heart and chronic kidney disease with heart failure and stage 1 through stage 4 chronic kidney disease, or unspecified chronic kidney disease: Secondary | ICD-10-CM | POA: Diagnosis present

## 2019-10-06 DIAGNOSIS — I2510 Atherosclerotic heart disease of native coronary artery without angina pectoris: Secondary | ICD-10-CM | POA: Diagnosis present

## 2019-10-06 DIAGNOSIS — I482 Chronic atrial fibrillation, unspecified: Secondary | ICD-10-CM | POA: Diagnosis present

## 2019-10-06 DIAGNOSIS — Z7984 Long term (current) use of oral hypoglycemic drugs: Secondary | ICD-10-CM | POA: Diagnosis not present

## 2019-10-06 DIAGNOSIS — Z7901 Long term (current) use of anticoagulants: Secondary | ICD-10-CM | POA: Diagnosis not present

## 2019-10-06 DIAGNOSIS — E785 Hyperlipidemia, unspecified: Secondary | ICD-10-CM | POA: Diagnosis present

## 2019-10-06 DIAGNOSIS — Z79899 Other long term (current) drug therapy: Secondary | ICD-10-CM | POA: Diagnosis not present

## 2019-10-06 LAB — LIPID PANEL
Cholesterol: 109 mg/dL (ref 0–200)
HDL: 49 mg/dL (ref >40)
LDL Cholesterol: 49 mg/dL (ref 0–99)
Total CHOL/HDL Ratio: 2.2 RATIO
Triglycerides: 57 mg/dL (ref <150)
VLDL: 11 mg/dL (ref 0–40)

## 2019-10-06 LAB — GLUCOSE, CAPILLARY
Glucose-Capillary: 129 mg/dL — ABNORMAL HIGH (ref 70–99)
Glucose-Capillary: 141 mg/dL — ABNORMAL HIGH (ref 70–99)
Glucose-Capillary: 143 mg/dL — ABNORMAL HIGH (ref 70–99)
Glucose-Capillary: 144 mg/dL — ABNORMAL HIGH (ref 70–99)
Glucose-Capillary: 154 mg/dL — ABNORMAL HIGH (ref 70–99)
Glucose-Capillary: 155 mg/dL — ABNORMAL HIGH (ref 70–99)
Glucose-Capillary: 156 mg/dL — ABNORMAL HIGH (ref 70–99)
Glucose-Capillary: 172 mg/dL — ABNORMAL HIGH (ref 70–99)
Glucose-Capillary: 173 mg/dL — ABNORMAL HIGH (ref 70–99)
Glucose-Capillary: 48 mg/dL — ABNORMAL LOW (ref 70–99)
Glucose-Capillary: 62 mg/dL — ABNORMAL LOW (ref 70–99)
Glucose-Capillary: 63 mg/dL — ABNORMAL LOW (ref 70–99)
Glucose-Capillary: 64 mg/dL — ABNORMAL LOW (ref 70–99)
Glucose-Capillary: 65 mg/dL — ABNORMAL LOW (ref 70–99)
Glucose-Capillary: 66 mg/dL — ABNORMAL LOW (ref 70–99)
Glucose-Capillary: 67 mg/dL — ABNORMAL LOW (ref 70–99)
Glucose-Capillary: 87 mg/dL (ref 70–99)
Glucose-Capillary: 90 mg/dL (ref 70–99)
Glucose-Capillary: 94 mg/dL (ref 70–99)
Glucose-Capillary: 95 mg/dL (ref 70–99)
Glucose-Capillary: 96 mg/dL (ref 70–99)
Glucose-Capillary: 98 mg/dL (ref 70–99)

## 2019-10-06 LAB — HEMOGLOBIN A1C
Hgb A1c MFr Bld: 6.5 % — ABNORMAL HIGH (ref 4.8–5.6)
Mean Plasma Glucose: 139.85 mg/dL

## 2019-10-06 LAB — BASIC METABOLIC PANEL
Anion gap: 10 (ref 5–15)
BUN: 11 mg/dL (ref 8–23)
CO2: 22 mmol/L (ref 22–32)
Calcium: 8.7 mg/dL — ABNORMAL LOW (ref 8.9–10.3)
Chloride: 103 mmol/L (ref 98–111)
Creatinine, Ser: 1.87 mg/dL — ABNORMAL HIGH (ref 0.61–1.24)
GFR calc Af Amer: 38 mL/min — ABNORMAL LOW (ref >60)
GFR calc non Af Amer: 33 mL/min — ABNORMAL LOW (ref >60)
Glucose, Bld: 67 mg/dL — ABNORMAL LOW (ref 70–99)
Potassium: 3.7 mmol/L (ref 3.5–5.1)
Sodium: 135 mmol/L (ref 135–145)

## 2019-10-06 LAB — SARS CORONAVIRUS 2 (TAT 6-24 HRS): SARS Coronavirus 2: NEGATIVE

## 2019-10-06 LAB — CBC
HCT: 42.4 % (ref 39.0–52.0)
Hemoglobin: 14 g/dL (ref 13.0–17.0)
MCH: 28.7 pg (ref 26.0–34.0)
MCHC: 33 g/dL (ref 30.0–36.0)
MCV: 86.9 fL (ref 80.0–100.0)
Platelets: 208 10*3/uL (ref 150–400)
RBC: 4.88 MIL/uL (ref 4.22–5.81)
RDW: 15.3 % (ref 11.5–15.5)
WBC: 9.2 10*3/uL (ref 4.0–10.5)
nRBC: 0 % (ref 0.0–0.2)

## 2019-10-06 MED ORDER — GLUCOSE 4 G PO CHEW
CHEWABLE_TABLET | ORAL | Status: AC
  Filled 2019-10-06: qty 1

## 2019-10-06 NOTE — Progress Notes (Signed)
Patient FSBS 63.

## 2019-10-06 NOTE — Progress Notes (Signed)
Pt care handed over to [place], [place].

## 2019-10-06 NOTE — Progress Notes (Signed)
Patient FSBS currently 47. On call physician notified, awaiting response.

## 2019-10-06 NOTE — Progress Notes (Signed)
Patient FSBS currently 73.

## 2019-10-06 NOTE — Progress Notes (Signed)
PROGRESS NOTE    [name] [name]  BZJ:[id number] DOB: Sep 14, 1934 DOA: 10/05/2019 PCP: Patient, No Pcp Per    Assessment & Plan:   Principal Problem:   Hypoglycemia Active Problems:   Unresponsiveness   Atrial fibrillation, chronic (HCC)   Chronic combined systolic (congestive) and diastolic (congestive) heart failure (HCC)   Hypertension   Hypercholesteremia   COPD (chronic obstructive pulmonary disease) (HCC)   CKD (chronic kidney disease), stage IIIa   Elevated troponin    [name] [name] is a 85 y.o. male with medical history significant of hypertension, hyperlipidemia, diabetes mellitus, COPD, asthma, gout, CAD, myocardial infarction, atrial fibrillation on Eliquis, CHF with EF 25-30%, CKD-3, who presents with unresponsiveness and hypoglycemia.   Unresponsiveness due to hypoglycemia:  His hypoglycemia is likely due to decreased oral intake and continuation of glipizide.  --A1c 6.5 --Has been on D10@125  since admission, but still intermittently dropped his BG. PLAN: --Decrease D10 drip at 75 cc/h -Check blood sugar level every hour --d/c Glipizide at discharge  Atrial fibrillation, chronic (HCC): -Continue Eliquis -Continue Mexitil and amiodarone  Chronic combined systolic (congestive) and diastolic (congestive) heart failure (HCC): stable 2D echo on 08/29/2019 showed EF 25-30% with grade 2 diastolic dysfunction.  Patient does not have leg edema or respiratory distress.  No pulmonary edema chest x-ray. Pt has a life vest on. -Continue Entresto, spironolactone, Bidil -Hold Lasix (pt is on prn lasix at home).  HTN:  -Continue home medications -hydralazine prn  Hypercholesteremia: -lipitgor  COPD (chronic obstructive pulmonary disease) (HCC): -Bronchodilators  CKD (chronic kidney disease), stage IIIa: Stable.    Elevated troponin 2/2 demand ischemia. --troponin peak 102, flat   DVT prophylaxis: OF:BPZWCHE Code Status: Full code  Family  Communication: not today Disposition Plan: Home tomorrow   Subjective and Interval History:  Pt reported feeling better.  Eating and drinking.  No fever, dyspnea, chest pain, abdominal pain, N/V/D.     Objective: Vitals:   10/06/19 0418 10/06/19 0740 10/06/19 1552 10/06/19 1931  BP: 93/66 131/64 96/65 115/70  Pulse: 90 83 75 73  Resp: 18 14 20 16   Temp: 99.1 F (37.3 C) 98.7 F (37.1 C) 98.6 F (37 C) 98.5 F (36.9 C)  TempSrc: Oral Oral Oral Oral  SpO2: 100% 100%  99%  Weight: 62.2 kg     Height:        Intake/Output Summary (Last 24 hours) at 10/06/2019 2008 Last data filed at 10/06/2019 1500 Gross per 24 hour  Intake 2388.97 ml  Output 250 ml  Net 2138.97 ml   Filed Weights   10/05/19 1251 10/06/19 0418  Weight: 67.6 kg 62.2 kg    Examination:   Constitutional: NAD, AAOx3 HEENT: conjunctivae and lids normal, EOMI CV: RRR no M,R,G. Distal pulses +2.  No cyanosis.   RESP: CTA B/L, normal respiratory effort  GI: +BS, NTND Extremities: No effusions, edema, or tenderness in BLE MSK: normal ROM and strength, no joint enlargement or tenderness of both UE and LE SKIN: warm, dry and intact Neuro: II - XII grossly intact.  Sensation intact Psych: Normal mood and affect.  Appropriate judgement and reason   Data Reviewed: I have personally reviewed following labs and imaging studies  CBC: Recent Labs  Lab 10/05/19 1302 10/06/19 0606  WBC 8.9 9.2  NEUTROABS 7.5  --   HGB 14.3 14.0  HCT 44.0 42.4  MCV 86.8 86.9  PLT 238 208   Basic Metabolic Panel: Recent Labs  Lab 10/05/19 1302 10/06/19 0606  NA  139 135  K 4.0 3.7  CL 104 103  CO2 22 22  GLUCOSE 51* 67*  BUN 13 11  CREATININE 1.44* 1.87*  CALCIUM 9.6 8.7*   GFR: Estimated Creatinine Clearance: 26.8 mL/min (A) (by C-G formula based on SCr of 1.87 mg/dL (H)). Liver Function Tests: Recent Labs  Lab 10/05/19 1302  AST 58*  ALT 56*  ALKPHOS 86  BILITOT 0.6  PROT 8.6*  ALBUMIN 4.1   Recent  Labs  Lab 10/05/19 1302  LIPASE 31   No results for input(s): AMMONIA in the last 168 hours. Coagulation Profile: No results for input(s): INR, PROTIME in the last 168 hours. Cardiac Enzymes: No results for input(s): CKTOTAL, CKMB, CKMBINDEX, TROPONINI in the last 168 hours. BNP (last 3 results) No results for input(s): PROBNP in the last 8760 hours. HbA1C: Recent Labs    10/06/19 0606  HGBA1C 6.5*   CBG: Recent Labs  Lab 10/06/19 1459 10/06/19 1613 10/06/19 1736 10/06/19 1844 10/06/19 1932  GLUCAP 90 95 129* 144* 155*   Lipid Profile: Recent Labs    10/06/19 0606  CHOL 109  HDL 49  LDLCALC 49  TRIG 57  CHOLHDL 2.2   Thyroid Function Tests: No results for input(s): TSH, T4TOTAL, FREET4, T3FREE, THYROIDAB in the last 72 hours. Anemia Panel: No results for input(s): VITAMINB12, FOLATE, FERRITIN, TIBC, IRON, RETICCTPCT in the last 72 hours. Sepsis Labs: No results for input(s): PROCALCITON, LATICACIDVEN in the last 168 hours.  Recent Results (from the past 240 hour(s))  SARS CORONAVIRUS 2 (TAT 6-24 HRS) Nasopharyngeal Nasopharyngeal Swab     Status: None   Collection Time: 10/05/19  2:18 PM   Specimen: Nasopharyngeal Swab  Result Value Ref Range Status   SARS Coronavirus 2 NEGATIVE NEGATIVE Final    Comment: (NOTE) SARS-CoV-2 target nucleic acids are NOT DETECTED. The SARS-CoV-2 RNA is generally detectable in upper and lower respiratory specimens during the acute phase of infection. Negative results do not preclude SARS-CoV-2 infection, do not rule out co-infections with other pathogens, and should not be used as the sole basis for treatment or other patient management decisions. Negative results must be combined with clinical observations, patient history, and epidemiological information. The expected result is Negative. Fact Sheet for Patients: HairSlick.no Fact Sheet for Healthcare  Providers: quierodirigir.com This test is not yet approved or cleared by the [place] FDA and  has been authorized for detection and/or diagnosis of SARS-CoV-2 by FDA under an Emergency Use Authorization (EUA). This EUA will remain  in effect (meaning this test can be used) for the duration of the COVID-19 declaration under Section 56 4(b)(1) of the Act, 21 U.S.C. section 360bbb-3(b)(1), unless the authorization is terminated or revoked sooner. Performed at [facility], [address], [place], [place] [address]       Radiology Studies: CT HEAD WO CONTRAST  Result Date: 10/05/2019 CLINICAL DATA:  Encephalopathy EXAM: CT HEAD WITHOUT CONTRAST TECHNIQUE: Contiguous axial images were obtained from the base of the skull through the vertex without intravenous contrast. COMPARISON:  August 28, 2019 FINDINGS: Brain: No evidence of acute infarction, hemorrhage, hydrocephalus, extra-axial collection or mass lesion/mass effect. Advanced volume loss and chronic microvascular ischemic changes are noted. 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 Vascular: No hyperdense vessel or unexpected calcification. Skull: Normal. Negative for fracture or focal lesion. Sinuses/Orbits: No acute finding. Other: None. IMPRESSION: 1. No acute intracranial abnormality. 2. Atrophy and chronic microvascular ischemic changes are again noted. Electronically Signed  By: Constance Holster M.D.   On: 10/05/2019 19:07   DG Chest Portable 1 View  Result Date: 10/05/2019 CLINICAL DATA:  Weakness, hypoglycemia. EXAM: PORTABLE CHEST 1 VIEW COMPARISON:  August 29, 2019. FINDINGS: The heart size and mediastinal contours are within normal limits. Both lungs are clear. Atherosclerosis of thoracic aorta is noted. The visualized skeletal structures are unremarkable. IMPRESSION: No active disease. Electronically Signed   By: Marijo Conception M.D.   On: 10/05/2019 14:02     Scheduled Meds: .  amiodarone  200 mg Oral Daily  . apixaban  2.5 mg Oral BID  . atorvastatin  40 mg Oral q1800  . glucose      . isosorbide-hydrALAZINE  1 tablet Oral TID  . magnesium oxide  200 mg Oral Daily  . mexiletine  300 mg Oral Q12H  . sacubitril-valsartan  1 tablet Oral BID  . spironolactone  25 mg Oral Daily   Continuous Infusions: . dextrose 75 mL/hr at 10/06/19 1928     LOS: 0 days     Enzo Bi, MD [facility] If 7PM-7AM, please contact night-coverage 10/06/2019, 8:08 PM

## 2019-10-06 NOTE — Plan of Care (Signed)
  Problem: Education: Goal: Knowledge of General Education information will improve Description: Including pain rating scale, medication(s)/side effects and non-pharmacologic comfort measures Outcome: Progressing   Problem: Pain Managment: Goal: General experience of comfort will improve Outcome: Progressing   Problem: Safety: Goal: Ability to remain free from injury will improve Outcome: Progressing   

## 2019-10-06 NOTE — Progress Notes (Signed)
Patient FSBS 143. Will continue to monitor.

## 2019-10-06 NOTE — Progress Notes (Signed)
Ouma, NP responds to message r/t current FSBS of 63, states to administer another amp of dextrose.

## 2019-10-06 NOTE — Progress Notes (Signed)
NP Ouma responds with new order to increases D10 to 172mL/hr.

## 2019-10-07 LAB — URINE CULTURE

## 2019-10-07 LAB — GLUCOSE, CAPILLARY
Glucose-Capillary: 127 mg/dL — ABNORMAL HIGH (ref 70–99)
Glucose-Capillary: 141 mg/dL — ABNORMAL HIGH (ref 70–99)
Glucose-Capillary: 142 mg/dL — ABNORMAL HIGH (ref 70–99)
Glucose-Capillary: 161 mg/dL — ABNORMAL HIGH (ref 70–99)
Glucose-Capillary: 165 mg/dL — ABNORMAL HIGH (ref 70–99)
Glucose-Capillary: 167 mg/dL — ABNORMAL HIGH (ref 70–99)
Glucose-Capillary: 183 mg/dL — ABNORMAL HIGH (ref 70–99)
Glucose-Capillary: 184 mg/dL — ABNORMAL HIGH (ref 70–99)

## 2019-10-07 LAB — CBC
HCT: 36.3 % — ABNORMAL LOW (ref 39.0–52.0)
Hemoglobin: 12.1 g/dL — ABNORMAL LOW (ref 13.0–17.0)
MCH: 28.7 pg (ref 26.0–34.0)
MCHC: 33.3 g/dL (ref 30.0–36.0)
MCV: 86.2 fL (ref 80.0–100.0)
Platelets: 195 10*3/uL (ref 150–400)
RBC: 4.21 MIL/uL — ABNORMAL LOW (ref 4.22–5.81)
RDW: 15.1 % (ref 11.5–15.5)
WBC: 7.3 10*3/uL (ref 4.0–10.5)
nRBC: 0 % (ref 0.0–0.2)

## 2019-10-07 LAB — MAGNESIUM: Magnesium: 1.3 mg/dL — ABNORMAL LOW (ref 1.7–2.4)

## 2019-10-07 LAB — BASIC METABOLIC PANEL
Anion gap: 9 (ref 5–15)
BUN: 11 mg/dL (ref 8–23)
CO2: 21 mmol/L — ABNORMAL LOW (ref 22–32)
Calcium: 8.4 mg/dL — ABNORMAL LOW (ref 8.9–10.3)
Chloride: 97 mmol/L — ABNORMAL LOW (ref 98–111)
Creatinine, Ser: 1.54 mg/dL — ABNORMAL HIGH (ref 0.61–1.24)
GFR calc Af Amer: 48 mL/min — ABNORMAL LOW (ref >60)
GFR calc non Af Amer: 41 mL/min — ABNORMAL LOW (ref >60)
Glucose, Bld: 149 mg/dL — ABNORMAL HIGH (ref 70–99)
Potassium: 4.2 mmol/L (ref 3.5–5.1)
Sodium: 127 mmol/L — ABNORMAL LOW (ref 135–145)

## 2019-10-07 MED ORDER — MAGNESIUM SULFATE 2 GM/50ML IV SOLN
2.0000 g | INTRAVENOUS | Status: AC
  Administered 2019-10-07 (×2): 2 g via INTRAVENOUS
  Filled 2019-10-07 (×2): qty 50

## 2019-10-07 MED ORDER — ALUM & MAG HYDROXIDE-SIMETH 200-200-20 MG/5ML PO SUSP: 15.0000 mL | Freq: Four times a day (QID) | ORAL | Status: DC | PRN

## 2019-10-07 MED ORDER — CALCIUM CARBONATE ANTACID 500 MG PO CHEW
1.0000 | CHEWABLE_TABLET | Freq: Three times a day (TID) | ORAL | Status: DC | PRN
  Administered 2019-10-07 (×2): 200 mg via ORAL
  Filled 2019-10-07 (×2): qty 1

## 2019-10-07 MED ORDER — DOCUSATE SODIUM 100 MG PO CAPS: 100.0000 mg | ORAL_CAPSULE | Freq: Two times a day (BID) | ORAL | Status: DC | PRN

## 2019-10-07 MED ORDER — ONDANSETRON 4 MG PO TBDP
4.0000 mg | ORAL_TABLET | Freq: Three times a day (TID) | ORAL | Status: DC | PRN
  Filled 2019-10-07: qty 1

## 2019-10-07 MED ORDER — GUAIFENESIN-DM 100-10 MG/5ML PO SYRP: 10.0000 mL | ORAL_SOLUTION | Freq: Four times a day (QID) | ORAL | Status: DC | PRN

## 2019-10-07 MED ORDER — ONDANSETRON HCL 4 MG/2ML IJ SOLN: 4.0000 mg | Freq: Four times a day (QID) | INTRAMUSCULAR | Status: DC | PRN

## 2019-10-07 MED ORDER — POLYETHYLENE GLYCOL 3350 17 G PO PACK: 17.0000 g | PACK | Freq: Two times a day (BID) | ORAL | Status: DC | PRN

## 2019-10-07 MED ORDER — SODIUM CHLORIDE 0.9% FLUSH
10.0000 mL | Freq: Two times a day (BID) | INTRAVENOUS | Status: DC
  Administered 2019-10-07 – 2019-10-08 (×2): 10 mL via INTRAVENOUS

## 2019-10-07 NOTE — Progress Notes (Signed)
Pts CBGs have been normal throughout the shift. Notified on call NP. Received new orders to check CBG Q4H.

## 2019-10-07 NOTE — Plan of Care (Signed)
?  Problem: Clinical Measurements: ?Goal: Diagnostic test results will improve ?Outcome: Progressing ?  ?Problem: Safety: ?Goal: Ability to remain free from injury will improve ?Outcome: Progressing ?  ?

## 2019-10-07 NOTE — Progress Notes (Addendum)
Rn was able to reach pt niece joyce daye , asking if she could bring in the battery to his life vest. She states she would bring it today. . No distress noted. vss.

## 2019-10-07 NOTE — Progress Notes (Signed)
PROGRESS NOTE    ASHTAN GIRTMAN  HXT:[id number] DOB: Mar 11, 1935 DOA: 10/05/2019 PCP: Patient, No Pcp Per    Assessment & Plan:   Principal Problem:   Hypoglycemia Active Problems:   Unresponsiveness   Atrial fibrillation, chronic (HCC)   Chronic combined systolic (congestive) and diastolic (congestive) heart failure (HCC)   Hypertension   Hypercholesteremia   COPD (chronic obstructive pulmonary disease) (HCC)   CKD (chronic kidney disease), stage IIIa   Elevated troponin    MARIN MILLEY is a 85 y.o. AA male with medical history significant of hypertension, hyperlipidemia, diabetes mellitus, COPD, asthma, gout, CAD, myocardial infarction, atrial fibrillation on Eliquis, CHF with EF 25-30%, CKD-3, who presents with unresponsiveness and hypoglycemia.   Unresponsiveness due to hypoglycemia:  His hypoglycemia is likely due to decreased oral intake and continuation of glipizide.  --A1c 6.5 --Has been on D10 gtt since admission, and BG finally stable in 100's today 1/24. PLAN: --d/c D10 gtt -Check blood sugar level every hour --d/c Glipizide at discharge  Atrial fibrillation, chronic (HCC): -Continue Eliquis -Continue Mexitil and amiodarone  Chronic combined systolic (congestive) and diastolic (congestive) heart failure (HCC): stable 2D echo on 08/29/2019 showed EF 25-30% with grade 2 diastolic dysfunction.  Patient does not have leg edema or respiratory distress.  No pulmonary edema chest x-ray. Pt has a life vest on. -Continue Entresto, spironolactone, Bidil -Hold Lasix (pt is on prn lasix at home).  HTN:  -Continue home medications -hydralazine prn  Hypercholesteremia: -lipitgor  COPD (chronic obstructive pulmonary disease) (HCC): -Bronchodilators  CKD (chronic kidney disease), stage IIIa: Stable.    Elevated troponin 2/2 demand ischemia. --troponin peak 102, flat   DVT prophylaxis: XK:PVVZSMO Code Status: Full code  Family Communication: not  today Disposition Plan: Home tomorrow after PT   Subjective and Interval History:  Pt reported doing ok.  No fever, dyspnea, chest pain, abdominal pain, N/V/D, dysuria, increased swelling.  BG stable, D10 infusion stopped.   Objective: Vitals:   10/06/19 1931 10/07/19 0356 10/07/19 0748 10/07/19 1530  BP: 115/70 130/60 138/64 (!) 145/62  Pulse: 73 73 87 68  Resp: 16 16 18 17   Temp: 98.5 F (36.9 C) 98.1 F (36.7 C) 98.2 F (36.8 C) 98.2 F (36.8 C)  TempSrc: Oral Oral Oral Oral  SpO2: 99% 96% 100% 100%  Weight:  64.5 kg    Height:        Intake/Output Summary (Last 24 hours) at 10/07/2019 1651 Last data filed at 10/07/2019 1640 Gross per 24 hour  Intake 0 ml  Output 375 ml  Net -375 ml   Filed Weights   10/05/19 1251 10/06/19 0418 10/07/19 0356  Weight: 67.6 kg 62.2 kg 64.5 kg    Examination:   Constitutional: NAD, AAOx3 HEENT: conjunctivae and lids normal, EOMI CV: RRR no M,R,G. Distal pulses +2.  No cyanosis.   RESP: CTA B/L, normal respiratory effort  GI: +BS, NTND Extremities: No effusions, edema, or tenderness in BLE SKIN: warm, dry and intact Neuro: II - XII grossly intact.  Sensation intact   Data Reviewed: I have personally reviewed following labs and imaging studies  CBC: Recent Labs  Lab 10/05/19 1302 10/06/19 0606 10/07/19 0928  WBC 8.9 9.2 7.3  NEUTROABS 7.5  --   --   HGB 14.3 14.0 12.1*  HCT 44.0 42.4 36.3*  MCV 86.8 86.9 86.2  PLT 238 208 195   Basic Metabolic Panel: Recent Labs  Lab 10/05/19 1302 10/06/19 0606 10/07/19 0928  NA 139 135  127*  K 4.0 3.7 4.2  CL 104 103 97*  CO2 22 22 21*  GLUCOSE 51* 67* 149*  BUN 13 11 11   CREATININE 1.44* 1.87* 1.54*  CALCIUM 9.6 8.7* 8.4*  MG  --   --  1.3*   GFR: Estimated Creatinine Clearance: 33.7 mL/min (A) (by C-G formula based on SCr of 1.54 mg/dL (H)). Liver Function Tests: Recent Labs  Lab 10/05/19 1302  AST 58*  ALT 56*  ALKPHOS 86  BILITOT 0.6  PROT 8.6*  ALBUMIN 4.1    Recent Labs  Lab 10/05/19 1302  LIPASE 31   No results for input(s): AMMONIA in the last 168 hours. Coagulation Profile: No results for input(s): INR, PROTIME in the last 168 hours. Cardiac Enzymes: No results for input(s): CKTOTAL, CKMB, CKMBINDEX, TROPONINI in the last 168 hours. BNP (last 3 results) No results for input(s): PROBNP in the last 8760 hours. HbA1C: Recent Labs    10/06/19 0606  HGBA1C 6.5*   CBG: Recent Labs  Lab 10/07/19 0108 10/07/19 0627 10/07/19 0750 10/07/19 1045 10/07/19 1559  GLUCAP 183* 165* 161* 141* 127*   Lipid Profile: Recent Labs    10/06/19 0606  CHOL 109  HDL 49  LDLCALC 49  TRIG 57  CHOLHDL 2.2   Thyroid Function Tests: No results for input(s): TSH, T4TOTAL, FREET4, T3FREE, THYROIDAB in the last 72 hours. Anemia Panel: No results for input(s): VITAMINB12, FOLATE, FERRITIN, TIBC, IRON, RETICCTPCT in the last 72 hours. Sepsis Labs: No results for input(s): PROCALCITON, LATICACIDVEN in the last 168 hours.  Recent Results (from the past 240 hour(s))  SARS CORONAVIRUS 2 (TAT 6-24 HRS) Nasopharyngeal Nasopharyngeal Swab     Status: None   Collection Time: 10/05/19  2:18 PM   Specimen: Nasopharyngeal Swab  Result Value Ref Range Status   SARS Coronavirus 2 NEGATIVE NEGATIVE Final    Comment: (NOTE) SARS-CoV-2 target nucleic acids are NOT DETECTED. The SARS-CoV-2 RNA is generally detectable in upper and lower respiratory specimens during the acute phase of infection. Negative results do not preclude SARS-CoV-2 infection, do not rule out co-infections with other pathogens, and should not be used as the sole basis for treatment or other patient management decisions. Negative results must be combined with clinical observations, patient history, and epidemiological information. The expected result is Negative. Fact Sheet for Patients: SugarRoll.be Fact Sheet for Healthcare  Providers: https://www.woods-mathews.com/ This test is not yet approved or cleared by the [place] FDA and  has been authorized for detection and/or diagnosis of SARS-CoV-2 by FDA under an Emergency Use Authorization (EUA). This EUA will remain  in effect (meaning this test can be used) for the duration of the COVID-19 declaration under Section 56 4(b)(1) of the Act, 21 U.S.C. section 360bbb-3(b)(1), unless the authorization is terminated or revoked sooner. Performed at [facility], [place] [address], [place], [place] [address]   Urine culture     Status: Abnormal   Collection Time: 10/05/19  2:29 PM   Specimen: Urine, Random  Result Value Ref Range Status   Specimen Description   Final    URINE, RANDOM Performed at [facility], [address], [place], [place] [address]    Special Requests   Final    NONE Performed at [facility], [address] [place], [place] [address]    Culture MULTIPLE SPECIES PRESENT, SUGGEST RECOLLECTION (A)  Final   Report Status 10/07/2019 FINAL  Final      Radiology Studies: CT HEAD WO  CONTRAST  Result Date: 10/05/2019 CLINICAL DATA:  Encephalopathy EXAM: CT HEAD WITHOUT CONTRAST TECHNIQUE: Contiguous axial images were obtained from the base of the skull through the vertex without intravenous contrast. COMPARISON:  August 28, 2019 FINDINGS: Brain: No evidence of acute infarction, hemorrhage, hydrocephalus, extra-axial collection or mass lesion/mass effect. Advanced volume loss and chronic microvascular ischemic changes are noted. 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 Vascular: No hyperdense vessel or unexpected calcification. Skull: Normal. Negative for fracture or focal lesion. Sinuses/Orbits: No acute finding. Other: None. IMPRESSION: 1. No acute intracranial abnormality. 2. Atrophy and chronic microvascular ischemic changes are again noted. Electronically Signed   By: Katherine Mantle M.D.   On:  10/05/2019 19:07     Scheduled Meds: . amiodarone  200 mg Oral Daily  . apixaban  2.5 mg Oral BID  . atorvastatin  40 mg Oral q1800  . isosorbide-hydrALAZINE  1 tablet Oral TID  . magnesium oxide  200 mg Oral Daily  . mexiletine  300 mg Oral Q12H  . sacubitril-valsartan  1 tablet Oral BID  . spironolactone  25 mg Oral Daily   Continuous Infusions:    LOS: 1 day     Darlin Priestly, MD [facility] If 7PM-7AM, please contact night-coverage 10/07/2019, 4:51 PM

## 2019-10-08 LAB — BASIC METABOLIC PANEL
Anion gap: 6 (ref 5–15)
BUN: 14 mg/dL (ref 8–23)
CO2: 22 mmol/L (ref 22–32)
Calcium: 8.6 mg/dL — ABNORMAL LOW (ref 8.9–10.3)
Chloride: 101 mmol/L (ref 98–111)
Creatinine, Ser: 1.7 mg/dL — ABNORMAL HIGH (ref 0.61–1.24)
GFR calc Af Amer: 43 mL/min — ABNORMAL LOW (ref >60)
GFR calc non Af Amer: 37 mL/min — ABNORMAL LOW (ref >60)
Glucose, Bld: 120 mg/dL — ABNORMAL HIGH (ref 70–99)
Potassium: 4.4 mmol/L (ref 3.5–5.1)
Sodium: 129 mmol/L — ABNORMAL LOW (ref 135–145)

## 2019-10-08 LAB — GLUCOSE, CAPILLARY
Glucose-Capillary: 145 mg/dL — ABNORMAL HIGH (ref 70–99)
Glucose-Capillary: 117 mg/dL — ABNORMAL HIGH (ref 70–99)
Glucose-Capillary: 200 mg/dL — ABNORMAL HIGH (ref 70–99)
Glucose-Capillary: 122 mg/dL — ABNORMAL HIGH (ref 70–99)

## 2019-10-08 LAB — CBC
HCT: 34.6 % — ABNORMAL LOW (ref 39.0–52.0)
Hemoglobin: 11.6 g/dL — ABNORMAL LOW (ref 13.0–17.0)
MCH: 29.3 pg (ref 26.0–34.0)
MCHC: 33.5 g/dL (ref 30.0–36.0)
MCV: 87.4 fL (ref 80.0–100.0)
Platelets: 204 10*3/uL (ref 150–400)
RBC: 3.96 MIL/uL — ABNORMAL LOW (ref 4.22–5.81)
RDW: 14.9 % (ref 11.5–15.5)
WBC: 6 10*3/uL (ref 4.0–10.5)
nRBC: 0 % (ref 0.0–0.2)

## 2019-10-08 LAB — MAGNESIUM: Magnesium: 2.1 mg/dL (ref 1.7–2.4)

## 2019-10-08 NOTE — Progress Notes (Signed)
PT Cancellation Note  Patient Details Name: [name] [name] MRN: [id number] DOB: 14-Jul-1935   Cancelled Treatment:    Reason Eval/Treat Not Completed: Other (comment)(Patient sitting at side of bed eating breakfast, receiving medications, PT to re-attempt as able.)  Olga Coaster PT, DPT 9:01 AM,10/08/19

## 2019-10-08 NOTE — TOC Transition Note (Signed)
Transition of Care [facility]) - CM/SW Discharge Note   Patient Details  Name: [name] [name] MRN: [id number] Date of Birth: April 09, 1935  Transition of [facility]) CM/SW Contact:  Shawn Route, RN Phone Number: 10/08/2019, 10:27 AM   Clinical Narrative:     Patient lives at home with his sister in a single family home.  His niece helps with errands and shopping for bother her mother and uncle.  Physical Therapy recommended Home Health PT and a rolling walker.  Patient has DME in the home:  Wheelchair and cane.  Rolling walker was provided by [facility] and [facility] services will be provided by [facility].  No further TOC needs at this time, please re-consult for new needs.   Final next level of care: Home w Home Health Services Barriers to Discharge: Barriers Resolved   Patient Goals and CMS Choice        Discharge Placement                       Discharge Plan and Services   Discharge Planning Services: CM Consult Post Acute Care Choice: Durable Medical Equipment          DME Arranged: Walker rolling DME Agency: [facility] Date DME Agency Contacted: 10/08/19 Time DME Agency Contacted: 1026 Representative spoke with at DME Agency: Nida Boatman HH Arranged: PT HH Agency: [facility] Date HH Agency Contacted: 10/08/19 Time HH Agency Contacted: 1026 Representative spoke with at [facility] Agency: Barbara Cower  Social Determinants of Health (SDOH) Interventions     Readmission Risk Interventions No flowsheet data found.

## 2019-10-08 NOTE — Progress Notes (Signed)
Iv and telemetry removed for discharge. Medication and discharge instructions reviewed with pt. Blood sugar before discharge 133. Pt wheeled down to family vehicle,no distress noted

## 2019-10-08 NOTE — Plan of Care (Signed)

## 2019-10-08 NOTE — Evaluation (Signed)
Physical Therapy Evaluation Patient Details Name: [name] [name] MRN: [id number] DOB: April 12, 1935 Today's Date: 10/08/2019   History of Present Illness  Pt is 85 y.o. AA male with medical history significant of hypertension, hyperlipidemia, diabetes mellitus, COPD, asthma, gout, CAD, myocardial infarction, atrial fibrillation on Eliquis, CHF with EF 25-30%, CKD-3, has a life vest who presents with unresponsiveness and hypoglycemia, elevated troponin attributed to demand ischemia.    Clinical Impression  Patient A&O x4, denied pain. Reported at baseline he uses his [facility], mod I for ADLs, niece assists with sister who is disabled as well as cooking/cleaning, pt denies any recent falls.  The patient demonstrated bed mobility mod I, and sit <> stand with and without RW, CGA. Improved safety noted with RW. The patient ambulated ~179ft total with RW and CGA/supervision. No overt LOB noted, instructed in safe use of RW (pt tended to ambulate with RW outside BOS and had difficulty turning RW.). The patient stated he felt nearly returned to normal. Overall the patient demonstrated deficits and changes from PLOF and would benefit from further skilled PT intervention. Recommendation is HHPT with intermittent supervision.    Follow Up Recommendations Home health PT;Supervision - Intermittent    Equipment Recommendations  Rolling walker with 5" wheels    Recommendations for Other Services       Precautions / Restrictions Precautions Precautions: Fall Precaution Comments: life vest Restrictions Weight Bearing Restrictions: No      Mobility  Bed Mobility Overal bed mobility: Modified Independent                Transfers Overall transfer level: Needs assistance Equipment used: Rolling walker (2 wheeled) Transfers: Sit to/from Stand Sit to Stand: Min guard         General transfer comment: performed twice during session, pt cued for safety first trial, improved safety noted with hand  placement/decreased impulsivity noted second trial  Ambulation/Gait Ambulation/Gait assistance: Min guard Gait Distance (Feet): 170 Feet Assistive device: Rolling walker (2 wheeled)   Gait velocity: decreased   General Gait Details: Pt slightly impulsive with RW, cued for proper use  Stairs            Wheelchair Mobility    Modified Rankin (Stroke Patients Only)       Balance Overall balance assessment: Needs assistance Sitting-balance support: Feet supported Sitting balance-Leahy Scale: Fair       Standing balance-Leahy Scale: Fair                               Pertinent Vitals/Pain Pain Assessment: No/denies pain    Home Living Family/patient expects to be discharged to:: Private residence Living Arrangements: Other relatives(sister) Available Help at Discharge: Family;Available PRN/intermittently(niece) Type of Home: Mobile home Home Access: Other (comment)(pt reported he has 1 step)     Home Layout: One level Home Equipment: Bedside commode;Walker - 2 wheels;Cane - single point Additional Comments: Pt reported his walker with two wheels is very old, does not roll well in his home. uses cane normally    Prior Function Level of Independence: Needs assistance   Gait / Transfers Assistance Needed: uses SPC at baseline  ADL's / Homemaking Assistance Needed: mod I for ADLs, niece performs cookig/cleaning        Hand Dominance   Dominant Hand: Right    Extremity/Trunk Assessment   Upper Extremity Assessment Upper Extremity Assessment: Defer to OT evaluation    Lower Extremity Assessment Lower Extremity  Assessment: Generalized weakness(Pt able to lift all extremities against gravity)       Communication   Communication: No difficulties  Cognition Arousal/Alertness: Awake/alert Behavior During Therapy: WFL for tasks assessed/performed Overall Cognitive Status: Within Functional Limits for tasks assessed                                         General Comments General comments (skin integrity, edema, etc.): HR monitoring intermittently, WFLs    Exercises Other Exercises Other Exercises: Pt instructed to utilize RW at home for improved safety   Assessment/Plan    PT Assessment Patient needs continued PT services  PT Problem List Decreased strength;Decreased mobility;Decreased safety awareness;Decreased activity tolerance;Decreased balance       PT Treatment Interventions Therapeutic exercise;DME instruction;Gait training;Balance training;Stair training;Neuromuscular re-education;Functional mobility training;Therapeutic activities;Patient/family education    PT Goals (Current goals can be found in the Care Plan section)  Acute Rehab PT Goals Patient Stated Goal: to go home PT Goal Formulation: With patient Time For Goal Achievement: 10/22/19 Potential to Achieve Goals: Good    Frequency Min 2X/week   Barriers to discharge        Co-evaluation               AM-PAC PT "6 Clicks" Mobility  Outcome Measure Help needed turning from your back to your side while in a flat bed without using bedrails?: A Little Help needed moving from lying on your back to sitting on the side of a flat bed without using bedrails?: A Little Help needed moving to and from a bed to a chair (including a wheelchair)?: A Little Help needed standing up from a chair using your arms (e.g., wheelchair or bedside chair)?: A Little Help needed to walk in hospital room?: A Little Help needed climbing 3-5 steps with a railing? : A Little 6 Click Score: 18    End of Session Equipment Utilized During Treatment: Gait belt Activity Tolerance: Patient tolerated treatment well Patient left: in chair;with chair alarm set;with call bell/phone within reach Nurse Communication: Mobility status PT Visit Diagnosis: Other abnormalities of gait and mobility (R26.89);Muscle weakness (generalized) (M62.81);Difficulty in walking,  not elsewhere classified (R26.2)    Time: 4098-1191 PT Time Calculation (min) (ACUTE ONLY): 18 min   Charges:   PT Evaluation $PT Eval Low Complexity: 1 Low PT Treatments $Gait Training: 8-22 mins        Olga Coaster PT, DPT 10:13 AM,10/08/19

## 2019-10-11 ENCOUNTER — Encounter (HOSPITAL_COMMUNITY): Payer: Medicare Other

## 2019-10-13 NOTE — Discharge Summary (Signed)
Physician Discharge Summary   [name]  male DOB: 1934/12/08  IZT:[id number]  PCP: [name] Sato, MD  Admit date: 10/05/2019 Discharge date: 10/08/2019  Admitted From: Home Disposition:  Home.  Niece (pt's decision maker) updated on the phone prior to discharge.  Home Health: Yes CODE STATUS: Full code  Discharge Instructions    Diet - low sodium heart healthy   Complete by: As directed    Discharge instructions   Complete by: As directed    Please stop taking your diabetes medication GLIPIZIDE, because your diabetes is now under control, and GLIPIZIDE was dropping your sugars too low.    Dr. Darlin Priestly - -   Increase activity slowly   Complete by: As directed        Hospital Course:  For full details, please see H&P, progress notes, consult notes and ancillary notes.  Briefly,  [name] [name] Mitchellis a 85 y.o.AA malewith medical history significant ofhypertension, hyperlipidemia, diabetes mellitus, COPD, asthma, gout, CAD, myocardial infarction, atrial fibrillation on Eliquis, CHF with EF 25-30%, CKD-3, who presented with unresponsiveness and hypoglycemia.   Unresponsiveness due to hypoglycemia: Per report, pt became unresponsive names at home, and found to have blood sugar 38. Pt was given D50, blood sugar improved to 160, but then decreased to 45 in ED. D10 gtt was started in ED.  Pt required D10 gtt since admission, and BG finally stabilized in 100's on 1/24.  Hishypoglycemia is likely due to decreased oral intake and continuation of glipizide.   A1c 6.5.  Given that A1c was well with in goal, pt's advanced age, and persistent hypoglycemia from Glipizide, Glipizide was d/c'ed, and pt was discharged on NO antiglycemics.  Atrial fibrillation, chronic (HCC): Continued Eliquis Continued Mexitilandamiodarone  Chronic combined systolic (congestive) and diastolic (congestive) heart failure (HCC):stable 2D echo on 08/29/2019 showed EF 25-30% with grade 2  diastolic dysfunction. Patient does not have leg edema or respiratory distress. No pulmonary edema chest x-ray. Pt had a life vest on.  Continued Entresto, spironolactone, Bidil.  Lasix not given since it was a PRN medication.  HTN:  Continued home medications  Hypercholesteremia: Continued home statin  COPD (chronic obstructive pulmonary disease) (HCC): Stable.  Continued Bronchodilators  CKD (chronic kidney disease), stage IIIa:Stable.   Elevated troponin 2/2 demand ischemia. troponin peak 102, flat.  No chest pain.   Discharge Diagnoses:  Principal Problem:   Hypoglycemia Active Problems:   Unresponsiveness   Atrial fibrillation, chronic (HCC)   Chronic combined systolic (congestive) and diastolic (congestive) heart failure (HCC)   Hypertension   Hypercholesteremia   COPD (chronic obstructive pulmonary disease) (HCC)   CKD (chronic kidney disease), stage IIIa   Elevated troponin    Discharge Instructions:  Allergies as of 10/08/2019   No Known Allergies     Medication List    STOP taking these medications   glipiZIDE XL 10 MG 24 hr tablet Generic drug: glipiZIDE     TAKE these medications   amiodarone 200 MG tablet Commonly known as: PACERONE Take 1 tablet (200 mg total) by mouth daily.   apixaban 2.5 MG Tabs tablet Commonly known as: ELIQUIS Take 1 tablet (2.5 mg total) by mouth 2 (two) times daily.   atorvastatin 40 MG tablet Commonly known as: LIPITOR Take 1 tablet (40 mg total) by mouth daily at 6 PM.   furosemide 20 MG tablet Commonly known as: LASIX Take 1 tablet (20 mg total) by mouth as needed.   isosorbide-hydrALAZINE 20-37.5 MG tablet Commonly  known as: BIDIL Take 1 tablet by mouth 3 (three) times daily.   Magnesium Oxide 200 MG Tabs Take 1 tablet (200 mg total) by mouth daily.   mexiletine 150 MG capsule Commonly known as: MEXITIL Take 2 capsules (300 mg total) by mouth every 12 (twelve) hours.   sacubitril-valsartan 49-51  MG Commonly known as: ENTRESTO Take 1 tablet by mouth 2 (two) times daily.   spironolactone 25 MG tablet Commonly known as: ALDACTONE Take 1 tablet (25 mg total) by mouth daily.         No Known Allergies   The results of significant diagnostics from this hospitalization (including imaging, microbiology, ancillary and laboratory) are listed below for reference.   Consultations:   Procedures/Studies: CT HEAD WO CONTRAST  Result Date: 10/05/2019 CLINICAL DATA:  Encephalopathy EXAM: CT HEAD WITHOUT CONTRAST TECHNIQUE: Contiguous axial images were obtained from the base of the skull through the vertex without intravenous contrast. COMPARISON:  August 28, 2019 FINDINGS: Brain: No evidence of acute infarction, hemorrhage, hydrocephalus, extra-axial collection or mass lesion/mass effect. Advanced volume loss and chronic microvascular ischemic changes are noted. 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 Vascular: No hyperdense vessel or unexpected calcification. Skull: Normal. Negative for fracture or focal lesion. Sinuses/Orbits: No acute finding. Other: None. IMPRESSION: 1. No acute intracranial abnormality. 2. Atrophy and chronic microvascular ischemic changes are again noted. Electronically Signed   By: Katherine Mantle M.D.   On: 10/05/2019 19:07   DG Chest Portable 1 View  Result Date: 10/05/2019 CLINICAL DATA:  Weakness, hypoglycemia. EXAM: PORTABLE CHEST 1 VIEW COMPARISON:  August 29, 2019. FINDINGS: The heart size and mediastinal contours are within normal limits. Both lungs are clear. Atherosclerosis of thoracic aorta is noted. The visualized skeletal structures are unremarkable. IMPRESSION: No active disease. Electronically Signed   By: Lupita Raider M.D.   On: 10/05/2019 14:02      Labs: BNP (last 3 results) Recent Labs    08/28/19 1919 09/20/19 1132 10/05/19 1549  BNP 1,337.0* 28.5 161.0*   Basic Metabolic Panel: Recent Labs  Lab 10/06/19 0606  10/07/19 0928 10/08/19 0515  NA 135 127* 129*  K 3.7 4.2 4.4  CL 103 97* 101  CO2 22 21* 22  GLUCOSE 67* 149* 120*  BUN 11 11 14   CREATININE 1.87* 1.54* 1.70*  CALCIUM 8.7* 8.4* 8.6*  MG  --  1.3* 2.1   Liver Function Tests: No results for input(s): AST, ALT, ALKPHOS, BILITOT, PROT, ALBUMIN in the last 168 hours. No results for input(s): LIPASE, AMYLASE in the last 168 hours. No results for input(s): AMMONIA in the last 168 hours. CBC: Recent Labs  Lab 10/06/19 0606 10/07/19 0928 10/08/19 0515  WBC 9.2 7.3 6.0  HGB 14.0 12.1* 11.6*  HCT 42.4 36.3* 34.6*  MCV 86.9 86.2 87.4  PLT 208 195 204   Cardiac Enzymes: No results for input(s): CKTOTAL, CKMB, CKMBINDEX, TROPONINI in the last 168 hours. BNP: Invalid input(s): POCBNP CBG: Recent Labs  Lab 10/07/19 2044 10/08/19 0120 10/08/19 0425 10/08/19 1110 10/08/19 1436  GLUCAP 184* 145* 117* 200* 122*   D-Dimer No results for input(s): DDIMER in the last 72 hours. Hgb A1c No results for input(s): HGBA1C in the last 72 hours. Lipid Profile No results for input(s): CHOL, HDL, LDLCALC, TRIG, CHOLHDL, LDLDIRECT in the last 72 hours. Thyroid function studies No results for input(s): TSH, T4TOTAL, T3FREE, THYROIDAB in the last 72 hours.  Invalid input(s): FREET3 Anemia work up No results for input(s):  VITAMINB12, FOLATE, FERRITIN, TIBC, IRON, RETICCTPCT in the last 72 hours. Urinalysis    Component Value Date/Time   COLORURINE YELLOW (A) 10/05/2019 1429   APPEARANCEUR HAZY (A) 10/05/2019 1429   LABSPEC 1.013 10/05/2019 1429   PHURINE 5.0 10/05/2019 1429   GLUCOSEU 150 (A) 10/05/2019 1429   HGBUR NEGATIVE 10/05/2019 1429   BILIRUBINUR NEGATIVE 10/05/2019 1429   KETONESUR NEGATIVE 10/05/2019 1429   PROTEINUR 30 (A) 10/05/2019 1429   NITRITE NEGATIVE 10/05/2019 1429   LEUKOCYTESUR NEGATIVE 10/05/2019 1429   Sepsis Labs Invalid input(s): PROCALCITONIN,  WBC,  LACTICIDVEN Microbiology Recent Results (from the past  240 hour(s))  SARS CORONAVIRUS 2 (TAT 6-24 HRS) Nasopharyngeal Nasopharyngeal Swab     Status: None   Collection Time: 10/05/19  2:18 PM   Specimen: Nasopharyngeal Swab  Result Value Ref Range Status   SARS Coronavirus 2 NEGATIVE NEGATIVE Final    Comment: (NOTE) SARS-CoV-2 target nucleic acids are NOT DETECTED. The SARS-CoV-2 RNA is generally detectable in upper and lower respiratory specimens during the acute phase of infection. Negative results do not preclude SARS-CoV-2 infection, do not rule out co-infections with other pathogens, and should not be used as the sole basis for treatment or other patient management decisions. Negative results must be combined with clinical observations, patient history, and epidemiological information. The expected result is Negative. Fact Sheet for Patients: SugarRoll.be Fact Sheet for Healthcare Providers: https://www.woods-mathews.com/ This test is not yet approved or cleared by the [place] FDA and  has been authorized for detection and/or diagnosis of SARS-CoV-2 by FDA under an Emergency Use Authorization (EUA). This EUA will remain  in effect (meaning this test can be used) for the duration of the COVID-19 declaration under Section 56 4(b)(1) of the Act, 21 U.S.C. section 360bbb-3(b)(1), unless the authorization is terminated or revoked sooner. Performed at [facility], [place] [address], [place], [place] [address]   Urine culture     Status: Abnormal   Collection Time: 10/05/19  2:29 PM   Specimen: Urine, Random  Result Value Ref Range Status   Specimen Description   Final    URINE, RANDOM Performed at [facility], [address], [place],  [address]    Special Requests   Final    NONE Performed at [facility], [place] [place],  [address]    Culture MULTIPLE SPECIES PRESENT, SUGGEST RECOLLECTION (A)  Final   Report Status 10/07/2019 FINAL   Final     Total time spend on discharging this patient, including the last patient exam, discussing the hospital stay, instructions for ongoing care as it relates to all pertinent caregivers, as well as preparing the medical discharge records, prescriptions, and/or referrals as applicable, is 35 minutes.    Enzo Bi, MD  [facility] 10/13/2019, 3:46 AM  If 7PM-7AM, please contact night-coverage

## 2019-11-06 ENCOUNTER — Encounter: Payer: Self-pay | Admitting: Cardiology

## 2019-11-06 ENCOUNTER — Ambulatory Visit (INDEPENDENT_AMBULATORY_CARE_PROVIDER_SITE_OTHER): Payer: Medicare Other | Admitting: Cardiology

## 2019-11-06 ENCOUNTER — Other Ambulatory Visit: Payer: Self-pay

## 2019-11-06 VITALS — BP 118/58 | HR 50 | Ht 70.0 in | Wt 142.5 lb

## 2019-11-06 DIAGNOSIS — I5022 Chronic systolic (congestive) heart failure: Secondary | ICD-10-CM | POA: Diagnosis not present

## 2019-11-06 DIAGNOSIS — I472 Ventricular tachycardia, unspecified: Secondary | ICD-10-CM

## 2019-11-06 NOTE — Patient Instructions (Signed)
Medication Instructions:  Your physician recommends that you continue on your current medications as directed. Please refer to the Current Medication list given to you today.  *If you need a refill on your cardiac medications before your next appointment, please call your pharmacy*  Lab Work: None ordered If you have labs (blood work) drawn today and your tests are completely normal, you will receive your results only by: Marland Kitchen MyChart Message (if you have MyChart) OR . A paper copy in the mail If you have any lab test that is abnormal or we need to change your treatment, we will call you to review the results.  Testing/Procedures: None ordered  Follow-Up: At [facility], you and your health needs are our priority.  As part of our continuing mission to provide you with exceptional heart care, we have created designated Provider Care Teams.  These Care Teams include your primary Cardiologist (physician) and Advanced Practice Providers (APPs -  Physician Assistants and Nurse Practitioners) who all work together to provide you with the care you need, when you need it.  Your next appointment:   To be determined.  Dr. Elberta Fortis is going to discuss your treatment plan with Dr. [name]. We will call you and let you know their recommendation/decision.

## 2019-11-06 NOTE — Progress Notes (Signed)
Electrophysiology Office Note   Date:  11/06/2019   ID:  [name] [name], DOB 25-Jul-1935, MRN [id number]  PCP:  [name] Sato, MD  Cardiologist:  Bens[place] Primary Electrophysiologist:  [name] Eden Jorja Loa, MD    Chief Complaint: VT   History of Present Illness: [name] [name] is a 85 y.o. male who is being seen today for the evaluation of VT at the request of No ref. provider found. Presenting today for electrophysiology evaluation.  He has a history significant for type 2 diabetes, hypertension, hyperlipidemia, coronary artery disease, atrial fibrillation, and CKD stage III.  He presented to the [facility] ER on 08/28/2019.  He was found to be in a wide-complex tachycardia with rapid rates.  He was hypothermic with a temperature of 80 degrees and smelled kerosene.  He was given IV diltiazem which made him markedly hypotensive and underwent a cardioversion.  Cardioversion had no effect.  He was started on norepinephrine, vancomycin and cefepime.  Echo at the time showed an ejection fraction of 25 to 30%.  Left heart catheterization showed nonobstructive coronary artery disease.  He was found to have VT and frequent PVCs.  Mexiletine was added and he was discharged with a LifeVest.  Today, he denies symptoms of palpitations, chest pain, shortness of breath, orthopnea, PND, lower extremity edema, claudication, dizziness, presyncope, syncope, bleeding, or neurologic sequela. The patient is tolerating medications without difficulties.  He has not had any further palpitations.  He was admitted to the hospital January 2020 with hypoglycemia and was taken off of his glipizide.  Since that time he has done well without complaint.   Past Medical History:  Diagnosis Date  . Arthritis    shoulder  . Asthma   . Atrial fibrillation (HCC)   . Chronic kidney disease    stage III  . COPD (chronic obstructive pulmonary disease) (HCC)   . Coronary artery disease   . Diabetes mellitus without  complication (HCC)    type 2  . Dysrhythmia    [facility] HEART IRREGULARLY IRREGULAR  . Full dentures    upper and lower  . Hypercholesteremia   . Hypertension   . Myocardial infarction (HCC)   . Shortness of breath dyspnea   . Swelling    FEET AND LEGS   Past Surgical History:  Procedure Laterality Date  . CARDIAC CATHETERIZATION    . CATARACT EXTRACTION W/PHACO Right 01/28/2016   Procedure: CATARACT EXTRACTION PHACO AND INTRAOCULAR LENS PLACEMENT (IOC) right eye;  Surgeon: [name] Mola, MD;  Location: [facility];  Service: Ophthalmology;  Laterality: Right;  DIABETIC-oral med MALYUGIN  . RETINAL DETACHMENT SURGERY    . RIGHT/LEFT HEART CATH AND CORONARY ANGIOGRAPHY N/A 09/03/2019   Procedure: RIGHT/LEFT HEART CATH AND CORONARY ANGIOGRAPHY;  Surgeon: [name] Patty, MD;  Location: [facility];  Service: Cardiovascular;  Laterality: N/A;     Current Outpatient Medications  Medication Sig Dispense Refill  . amiodarone (PACERONE) 200 MG tablet Take 1 tablet (200 mg total) by mouth daily. 30 tablet 6  . apixaban (ELIQUIS) 2.5 MG TABS tablet Take 1 tablet (2.5 mg total) by mouth 2 (two) times daily. 60 tablet 6  . atorvastatin (LIPITOR) 40 MG tablet Take 1 tablet (40 mg total) by mouth daily at 6 PM. 30 tablet 6  . furosemide (LASIX) 20 MG tablet Take 20 mg by mouth daily.    . isosorbide-hydrALAZINE (BIDIL) 20-37.5 MG tablet Take 1 tablet by mouth 3 (three) times daily. 90 tablet  6  . Magnesium Oxide 200 MG TABS Take 1 tablet (200 mg total) by mouth daily. 90 tablet 3  . mexiletine (MEXITIL) 150 MG capsule Take 2 capsules (300 mg total) by mouth every 12 (twelve) hours. 60 capsule 6  . sacubitril-valsartan (ENTRESTO) 49-51 MG Take 1 tablet by mouth 2 (two) times daily. 60 tablet 6  . spironolactone (ALDACTONE) 25 MG tablet Take 1 tablet (25 mg total) by mouth daily. 30 tablet 6   No current facility-administered medications for this visit.     Allergies:   Patient has no known allergies.   Social History:  The patient  reports that he has quit smoking. His smoking use included cigarettes. He has a 12.50 pack-year smoking history. His smokeless tobacco use includes chew. He reports that he does not drink alcohol or use drugs.   Family History:  The patient's family history includes Stroke in his mother.    ROS:  Please see the history of present illness.   Otherwise, review of systems is positive for none.   All other systems are reviewed and negative.    PHYSICAL EXAM: VS:  BP (!) 118/58   Pulse (!) 50   Ht 5\' 10"  (1.778 m)   Wt 142 lb 8 oz (64.6 kg) Comment: Pt unable to stand to weight today  SpO2 94%   BMI 20.45 kg/m  , BMI Body mass index is 20.45 kg/m. GEN: Well nourished, well developed, in no acute distress  HEENT: normal  Neck: no JVD, carotid bruits, or masses Cardiac: RRR; no murmurs, rubs, or gallops,no edema  Respiratory:  clear to auscultation bilaterally, normal work of breathing GI: soft, nontender, nondistended, + BS MS: no deformity or atrophy  Skin: warm and dry Neuro:  Strength and sensation are intact Psych: euthymic mood, full affect  EKG:  EKG is ordered today. Personal review of the ekg ordered shows sinus rhythm, rate 65   Recent Labs: 08/29/2019: TSH 1.042 10/05/2019: ALT 56; B Natriuretic Peptide 161.0 10/08/2019: BUN 14; Creatinine, Ser 1.70; Hemoglobin 11.6; Magnesium 2.1; Platelets 204; Potassium 4.4; Sodium 129    Lipid Panel     Component Value Date/Time   CHOL 109 10/06/2019 0606   TRIG 57 10/06/2019 0606   HDL 49 10/06/2019 0606   CHOLHDL 2.2 10/06/2019 0606   VLDL 11 10/06/2019 0606   LDLCALC 49 10/06/2019 0606     Wt Readings from Last 3 Encounters:  11/06/19 142 lb 8 oz (64.6 kg)  10/08/19 142 lb 8 oz (64.6 kg)  09/20/19 150 lb 6.4 oz (68.2 kg)      Other studies Reviewed: Additional studies/ records that were reviewed today include: TTE 08/29/19  Review  of the above records today demonstrates:  1. Severely reduced LVEF (25-30%) with global HK. LVOT VTI 10 cm which  equates to CO/CI of 2.0 L/min and 1.0 L/min/m2.  2. Left ventricular ejection fraction, by visual estimation, is 25 to  30%. The left ventricle has severely decreased function. There is  borderline left ventricular hypertrophy.  3. Left ventricular diastolic parameters are consistent with Grade II  diastolic dysfunction (pseudonormalization).  4. The left ventricle demonstrates global hypokinesis.  5. Global right ventricle has severely reduced systolic function.The  right ventricular size is mildly enlarged. No increase in right  ventricular wall thickness.  6. Left atrial size was normal.  7. Right atrial size was mild-moderately dilated.  8. Presence of pericardial fat pad.  9. The pericardial effusion is circumferential.  10. Trivial  pericardial effusion is present.  11. The mitral valve is degenerative. Mild mitral valve regurgitation.  12. The tricuspid valve is grossly normal. Tricuspid valve regurgitation  moderate-severe.  13. The aortic valve is tricuspid. Aortic valve regurgitation is trivial.  Mild to moderate aortic valve sclerosis/calcification without any evidence  of aortic stenosis.  14. The pulmonic valve was grossly normal. Pulmonic valve regurgitation is  not visualized.  15. Normal pulmonary artery systolic pressure.  16. The inferior vena cava is dilated in size with <50% respiratory  variability, suggesting right atrial pressure of 15 mmHg.  17. The tricuspid regurgitant velocity is 1.89 m/s, and with an assumed  right atrial pressure of 15 mmHg, the estimated right ventricular systolic  pressure is normal at 29.3 mmHg.  18. Elevated left atrial pressure.   RHC/LHC 09/03/19  Ost LAD to Prox LAD lesion is 40% stenosed.  2nd Diag lesion is 60% stenosed.  Dist Cx lesion is 70% stenosed.  1st Mrg lesion is 30% stenosed.  Ost RCA to  Prox RCA lesion is 30% stenosed.  RPAV lesion is 40% stenosed.  3rd RPL lesion is 60% stenosed.     ASSESSMENT AND PLAN:  1.  Chronic systolic heart failure due to severe biventricular dysfunction: Recent hospital admission for cardiogenic shock likely due to ventricular tachycardia.  Is NYHA class III.  Currently on Entresto, Aldactone, BiDil.  He Hendrixx Severin have a repeat echocardiogram to see if his ejection fraction has improved.  2.  Ventricular tachycardia: Currently on amiodarone and mexiletine.  Currently has a LifeVest in place.  We Larinda Herter plan for repeat echocardiogram to see if his ejection fraction is improved.  If his ejection fraction has improved, he may not need ICD therapy.  We Jace Fermin discuss this with his primary cardiologist.  3.  Coronary artery disease: Left heart catheterization 09/03/2019 showed nonobstructive disease.  No signs of ischemia.    Current medicines are reviewed at length with the patient today.   The patient does not have concerns regarding his medicines.  The following changes were made today:  none  Labs/ tests ordered today include:  Orders Placed This Encounter  Procedures  . EKG 12-Lead     Disposition:   FU with Dayron Odland pending echo  Signed, Kanasia Gayman Jorja Loa, MD  11/06/2019 4:39 PM     [facility] [address] [place] [address] [phone] (office) [phone] (fax)

## 2019-11-07 ENCOUNTER — Encounter (HOSPITAL_COMMUNITY): Payer: Medicare Other | Admitting: Internal Medicine

## 2019-11-13 ENCOUNTER — Telehealth (HOSPITAL_COMMUNITY): Payer: Self-pay

## 2019-11-13 NOTE — Telephone Encounter (Signed)

## 2019-11-14 ENCOUNTER — Encounter (HOSPITAL_COMMUNITY): Payer: Self-pay | Admitting: Internal Medicine

## 2019-11-14 ENCOUNTER — Ambulatory Visit (HOSPITAL_COMMUNITY)
Admission: RE | Admit: 2019-11-14 | Discharge: 2019-11-14 | Disposition: A | Discharge status: Home / Self Care | Payer: Medicare Other | Source: Ambulatory Visit | Attending: Internal Medicine | Admitting: Internal Medicine

## 2019-11-14 ENCOUNTER — Other Ambulatory Visit: Payer: Self-pay

## 2019-11-14 VITALS — BP 94/42 | HR 62 | Wt 136.0 lb

## 2019-11-14 DIAGNOSIS — E1122 Type 2 diabetes mellitus with diabetic chronic kidney disease: Secondary | ICD-10-CM | POA: Diagnosis not present

## 2019-11-14 DIAGNOSIS — Z79899 Other long term (current) drug therapy: Secondary | ICD-10-CM | POA: Diagnosis not present

## 2019-11-14 DIAGNOSIS — J449 Chronic obstructive pulmonary disease, unspecified: Secondary | ICD-10-CM | POA: Insufficient documentation

## 2019-11-14 DIAGNOSIS — I472 Ventricular tachycardia, unspecified: Secondary | ICD-10-CM

## 2019-11-14 DIAGNOSIS — R54 Age-related physical debility: Secondary | ICD-10-CM | POA: Insufficient documentation

## 2019-11-14 DIAGNOSIS — I5022 Chronic systolic (congestive) heart failure: Secondary | ICD-10-CM | POA: Diagnosis not present

## 2019-11-14 DIAGNOSIS — Z7901 Long term (current) use of anticoagulants: Secondary | ICD-10-CM | POA: Insufficient documentation

## 2019-11-14 DIAGNOSIS — Z681 Body mass index (BMI) 19 or less, adult: Secondary | ICD-10-CM | POA: Diagnosis not present

## 2019-11-14 DIAGNOSIS — I252 Old myocardial infarction: Secondary | ICD-10-CM | POA: Diagnosis not present

## 2019-11-14 DIAGNOSIS — Z87891 Personal history of nicotine dependence: Secondary | ICD-10-CM | POA: Insufficient documentation

## 2019-11-14 DIAGNOSIS — N183 Chronic kidney disease, stage 3 unspecified: Secondary | ICD-10-CM | POA: Insufficient documentation

## 2019-11-14 DIAGNOSIS — E7800 Pure hypercholesterolemia, unspecified: Secondary | ICD-10-CM | POA: Diagnosis not present

## 2019-11-14 DIAGNOSIS — I5042 Chronic combined systolic (congestive) and diastolic (congestive) heart failure: Secondary | ICD-10-CM | POA: Diagnosis present

## 2019-11-14 DIAGNOSIS — N1832 Chronic kidney disease, stage 3b: Secondary | ICD-10-CM | POA: Diagnosis not present

## 2019-11-14 DIAGNOSIS — I2510 Atherosclerotic heart disease of native coronary artery without angina pectoris: Secondary | ICD-10-CM

## 2019-11-14 DIAGNOSIS — I130 Hypertensive heart and chronic kidney disease with heart failure and stage 1 through stage 4 chronic kidney disease, or unspecified chronic kidney disease: Secondary | ICD-10-CM | POA: Diagnosis not present

## 2019-11-14 DIAGNOSIS — R64 Cachexia: Secondary | ICD-10-CM | POA: Diagnosis not present

## 2019-11-14 DIAGNOSIS — I482 Chronic atrial fibrillation, unspecified: Secondary | ICD-10-CM | POA: Insufficient documentation

## 2019-11-14 DIAGNOSIS — I428 Other cardiomyopathies: Secondary | ICD-10-CM | POA: Diagnosis not present

## 2019-11-14 LAB — CBC
HCT: 31.8 % — ABNORMAL LOW (ref 39.0–52.0)
Hemoglobin: 10 g/dL — ABNORMAL LOW (ref 13.0–17.0)
MCH: 28.5 pg (ref 26.0–34.0)
MCHC: 31.4 g/dL (ref 30.0–36.0)
MCV: 90.6 fL (ref 80.0–100.0)
Platelets: 339 10*3/uL (ref 150–400)
RBC: 3.51 MIL/uL — ABNORMAL LOW (ref 4.22–5.81)
RDW: 15.9 % — ABNORMAL HIGH (ref 11.5–15.5)
WBC: 9.7 10*3/uL (ref 4.0–10.5)
nRBC: 0 % (ref 0.0–0.2)

## 2019-11-14 LAB — COMPREHENSIVE METABOLIC PANEL
ALT: 63 U/L — ABNORMAL HIGH (ref 0–44)
AST: 56 U/L — ABNORMAL HIGH (ref 15–41)
Albumin: 3.1 g/dL — ABNORMAL LOW (ref 3.5–5.0)
Alkaline Phosphatase: 109 U/L (ref 38–126)
Anion gap: 10 (ref 5–15)
BUN: 27 mg/dL — ABNORMAL HIGH (ref 8–23)
CO2: 21 mmol/L — ABNORMAL LOW (ref 22–32)
Calcium: 8.9 mg/dL (ref 8.9–10.3)
Chloride: 105 mmol/L (ref 98–111)
Creatinine, Ser: 2.86 mg/dL — ABNORMAL HIGH (ref 0.61–1.24)
GFR calc Af Amer: 23 mL/min — ABNORMAL LOW (ref >60)
GFR calc non Af Amer: 20 mL/min — ABNORMAL LOW (ref >60)
Glucose, Bld: 115 mg/dL — ABNORMAL HIGH (ref 70–99)
Potassium: 5.2 mmol/L — ABNORMAL HIGH (ref 3.5–5.1)
Sodium: 136 mmol/L (ref 135–145)
Total Bilirubin: 0.3 mg/dL (ref 0.3–1.2)
Total Protein: 7.3 g/dL (ref 6.5–8.1)

## 2019-11-14 LAB — BRAIN NATRIURETIC PEPTIDE: B Natriuretic Peptide: 49.5 pg/mL (ref 0.0–100.0)

## 2019-11-14 LAB — T4, FREE: Free T4: 1.52 ng/dL — ABNORMAL HIGH (ref 0.61–1.12)

## 2019-11-14 LAB — TSH: TSH: 1.806 u[IU]/mL (ref 0.350–4.500)

## 2019-11-14 MED ORDER — SACUBITRIL-VALSARTAN 24-26 MG PO TABS: 1.0000 | ORAL_TABLET | Freq: Two times a day (BID) | ORAL | 5 refills | Status: DC

## 2019-11-14 NOTE — Progress Notes (Signed)
Advanced HF Clinic Note  PCP: None  Primary HF Cardiologist: Dr [name]  HPI: Mr Paulding is an 85 year old with history of DM2, HTN. HL, CAD, chronic AF and CKD 3 (1.7) .   Went to [facility] ER on 08/28/19 and found to be AFL/AF with RVR with rates in 190s. Reportedly was hypothermic with temp of 88 degrees and smelled like kerosene.Given 25mg  of IV diltiazem in ER and became markedly hypotensive. Underwent attempted DC-CV without effect. Felt to have code sepsis as well. Started on NE and vanc/cefipime. Lactate 7.8. Transferred to [facility]. ECHO was completed and showed EF 25-30%. Had RHC/LHC with nonobstructive CAD and well compensated hemodynamics. EP consulted for VT and frequent PVCs. Loaded on amio drip and due to ongoing PVCs mexiletene was added. Discharged with Life Vest.   Seen in HF Clinic in January. Mildly overloaded but doing ok.  Readmitted 1/22-25/2021 with symptomatic hypoglycemia (38). Given d50 and D10 gtt. Glipizide stopped.   Today he returns for HF follow up. Says he feels ok. Weak. Can do ADLs but legs get tired quickly. No SOB, orthopnea or PND. Has poor appetite. Denies dizziness or CP.   ECHO 08/29/19 EF 25-30% with global HK. LVOT VTI 10 cm which equates to CO/CI of 2.0 L/min and 1.0 L/min/m2.   RHC/LHC 09/03/19   Ost LAD to Prox LAD lesion is 40% stenosed.  2nd Diag lesion is 60% stenosed.  Dist Cx lesion is 70% stenosed.  1st Mrg lesion is 30% stenosed.  Ost RCA to Prox RCA lesion is 30% stenosed.  RPAV lesion is 40% stenosed.  3rd RPL lesion is 60% stenosed. Findings: Ao = 138/47 (72) LV = 136/2 RA = 4 RV = 29/1 PA = 33/4 (11) PCW = 10 (v = 25) Fick cardiac output/index = 5.7/3.0 PVR = < 1.0 WU Ao sat = 99% PA sat = 66%, 67% Assessment: 1. Non-obstructive CAD 2. Severe NICM with EF 25% by echo 3. VT and frequent PVCs 4. Well-compensated hemodynamics  ROS: All systems negative except as listed in HPI, PMH and Problem List.  SH:   Social History   Socioeconomic History  . Marital status: Single    Spouse name: Not on file  . Number of children: Not on file  . Years of education: Not on file  . Highest education level: Not on file  Occupational History  . Not on file  Tobacco Use  . Smoking status: Former Smoker    Packs/day: 0.25    Years: 50.00    Pack years: 12.50    Types: Cigarettes  . Smokeless tobacco: Current User    Types: Chew  Substance and Sexual Activity  . Alcohol use: No  . Drug use: No  . Sexual activity: Not on file  Other Topics Concern  . Not on file  Social History Narrative  . Not on file   Social Determinants of Health   Financial Resource Strain:   . Difficulty of Paying Living Expenses: Not on file  Food Insecurity:   . Worried About 09/05/19 in the Last Year: Not on file  . Ran Out of Food in the Last Year: Not on file  Transportation Needs:   . Lack of Transportation (Medical): Not on file  . Lack of Transportation (Non-Medical): Not on file  Physical Activity:   . Days of Exercise per Week: Not on file  . Minutes of Exercise per Session: Not on file  Stress:   .  Feeling of Stress : Not on file  Social Connections:   . Frequency of Communication with Friends and Family: Not on file  . Frequency of Social Gatherings with Friends and Family: Not on file  . Attends Religious Services: Not on file  . Active Member of Clubs or Organizations: Not on file  . Attends Archivist Meetings: Not on file  . Marital Status: Not on file  Intimate Partner Violence:   . Fear of Current or Ex-Partner: Not on file  . Emotionally Abused: Not on file  . Physically Abused: Not on file  . Sexually Abused: Not on file    FH:  Family History  Problem Relation Age of Onset  . Stroke Mother     Past Medical History:  Diagnosis Date  . Arthritis    shoulder  . Asthma   . Atrial fibrillation ([place])   . Chronic kidney disease    stage III  . COPD (chronic  obstructive pulmonary disease) ([place])   . Coronary artery disease   . Diabetes mellitus without complication ([place])    type 2  . Dysrhythmia    [facility] HEART IRREGULARLY IRREGULAR  . Full dentures    upper and lower  . Hypercholesteremia   . Hypertension   . Myocardial infarction ([place])   . Shortness of breath dyspnea   . Swelling    FEET AND LEGS    Current Outpatient Medications  Medication Sig Dispense Refill  . amiodarone (PACERONE) 200 MG tablet Take 1 tablet (200 mg total) by mouth daily. 30 tablet 6  . apixaban (ELIQUIS) 2.5 MG TABS tablet Take 1 tablet (2.5 mg total) by mouth 2 (two) times daily. 60 tablet 6  . atorvastatin (LIPITOR) 40 MG tablet Take 1 tablet (40 mg total) by mouth daily at 6 PM. 30 tablet 6  . furosemide (LASIX) 20 MG tablet Take 20 mg by mouth daily.    . isosorbide-hydrALAZINE (BIDIL) 20-37.5 MG tablet Take 1 tablet by mouth 3 (three) times daily. 90 tablet 6  . Magnesium Oxide 200 MG TABS Take 1 tablet (200 mg total) by mouth daily. 90 tablet 3  . mexiletine (MEXITIL) 150 MG capsule Take 2 capsules (300 mg total) by mouth every 12 (twelve) hours. 60 capsule 6  . sacubitril-valsartan (ENTRESTO) 49-51 MG Take 1 tablet by mouth 2 (two) times daily. 60 tablet 6  . spironolactone (ALDACTONE) 25 MG tablet Take 1 tablet (25 mg total) by mouth daily. 30 tablet 6   No current facility-administered medications for this encounter.    Vitals:   11/14/19 1508  Weight: 61.7 kg (136 lb)   Vitals:   11/14/19 1508  BP: (!) 94/42  Pulse: 62  SpO2: 100%   Wt Readings from Last 3 Encounters:  11/14/19 61.7 kg (136 lb)  11/06/19 64.6 kg (142 lb 8 oz)  10/08/19 64.6 kg (142 lb 8 oz)    PHYSICAL EXAM: General:  cachetic elderly male.  No resp difficulty. In wheel chair. Hard to understand HEENT: normal Neck: supple. JVP 7-8. Carotids 2+ bilat; no bruits. No lymphadenopathy or thryomegaly appreciated. Cor: PMI nondisplaced. Regular rate & rhythm.  No rubs, gallops or murmurs. Lungs: clear Abdomen: soft, nontender, nondistended. No hepatosplenomegaly. No bruits or masses. Good bowel sounds. Extremities: cachetic no cyanosis, clubbing, rash, edema Neuro: alert & orientedx3, cranial nerves grossly intact. moves all 4 extremities w/o difficulty. Affect pleasant LifeVest in palce    ASSESSMENT & PLAN: 1. Chronic Systolic HF with  severe biventricular dysfunction  -Recent cardiogenic shock  - 08/2019 Echo with EF 25% with severe RV dysfunction - Suspect VT due to low EF (not vice versa) - NYHA III-IIIB likely due to combination of HF and cachexia - Volume status ok  - BP low - No bb for now.  - Continue spiro 25 daily  - Decrease Entresto to 24/26 bid - Continue Bidil 1 tab tid - Check CMET, BNP and TFTs  - See back in 1-2 months with echo. Given fraility we discussed possibly removing LifeVest but he would like to continue. Doubt he will be ICD candidate   2. VT  -  Admitted for VT in 08/2019. Wearing lifevest with no recurrence -  Continue amio and mexiletine per EP.  - See back in 1-2 months with echo. Given fraility we discussed possibly removing LifeVest but he would like to continue. Doubt he will be ICD candidate. Continue f/u with EP  3. CAD - LHC 09/03/19 Nonobstructive CAD. - No s/s ischemia -Continue ASA - Continue atorvastatin 40 mg daily.  4. CKD3 - baseline creatinine 1.7  - Check BMET  5. DM2 - off all DM meds due to symptomatic hypoglycemia   6. Cachexia - recommended Boost supplements. Concern for possible underlying malignancy.  - asked him to f/u with PCP    Arvilla Meres  MD 3:19 PM

## 2019-11-14 NOTE — Patient Instructions (Signed)
Make sure you are not taking Digoxin   DECREASE Entresto 24/26mg  (1 tab) twice a day   Labs today We will only contact you if something comes back abnormal or we need to make some changes. Otherwise no news is good news!   Your physician has requested that you have an echocardiogram. Echocardiography is a painless test that uses sound waves to create images of your heart. It provides your doctor with information about the size and shape of your heart and how well your heart's chambers and valves are working. This procedure takes approximately one hour. There are no restrictions for this procedure.   Your physician recommends that you schedule a follow-up appointment in: 2 month with Dr [name]  Please call office at [phone] option 2 if you have any questions or concerns.   At the [facility], you and your health needs are our priority. As part of our continuing mission to provide you with exceptional heart care, we have created designated Provider Care Teams. These Care Teams include your primary Cardiologist (physician) and Advanced Practice Providers (APPs- Physician Assistants and Nurse Practitioners) who all work together to provide you with the care you need, when you need it.   You may see any of the following providers on your designated Care Team at your next follow up: Marland Kitchen Dr Arvilla Meres . Dr Marca Ancona . Tonye Becket, NP . Robbie Lis, PA . Karle Plumber, PharmD   Please be sure to bring in all your medications bottles to every appointment.

## 2019-11-15 ENCOUNTER — Telehealth (HOSPITAL_COMMUNITY): Payer: Self-pay

## 2019-11-15 ENCOUNTER — Telehealth (HOSPITAL_COMMUNITY): Payer: Self-pay | Admitting: *Deleted

## 2019-11-15 DIAGNOSIS — I5042 Chronic combined systolic (congestive) and diastolic (congestive) heart failure: Secondary | ICD-10-CM

## 2019-11-15 NOTE — Telephone Encounter (Signed)
Spoke w/pt, he is aware and states he understands, he states he will try to come Monday as long as he can get a ride, advised if not to call us back.

## 2019-11-15 NOTE — Telephone Encounter (Signed)
Patients niece Bonita Quin called to inquire about removing Technical sales engineer. She states patient told her that the doctor mentioned removing at visit. She called to inquire the truth to that. Per Dr [name] note, it was discussed at visit however patient declined and wanted to continue wearing it. D/w Dr [name] in clinic. He advised that patient can remove vest if he wishes. They should understand that patient can have a cardiac event that can take his life.  He havent had an event in 2 months wearing the vest and he thinks the risk is low however still there. Made linda aware of this.  She verbalized understanding and will have a conversation with patient again. I will notify rep from [employer] to reach out to patient

## 2019-11-15 NOTE — Telephone Encounter (Signed)
-----   Message from Dolores Patty, MD sent at 11/14/2019 11:02 PM EST ----- He is volume depleted. Stop lasix and spiro. Hgb dropping as well.   Repeat BMET and CBC on Monday,

## 2019-11-16 NOTE — Telephone Encounter (Signed)
Called [employer], verbal order to discontinue vest issued.  They will call patients niece Bonita Quin to discuss return of vest. They advised vest can be removed. Spoke with Bonita Quin, advised patient can remove vest and the company will call her to discuss return.  Verbalized understanding and appreciation.

## 2019-11-19 ENCOUNTER — Other Ambulatory Visit (HOSPITAL_COMMUNITY): Payer: Medicare Other

## 2019-11-20 LAB — T3, FREE: T3, Free: 1 pg/mL — ABNORMAL LOW (ref 2.0–4.4)

## 2019-11-26 MED ORDER — IPRATROPIUM-ALBUTEROL 0.5-2.5 (3) MG/3ML IN SOLN
3.00 | RESPIRATORY_TRACT | Status: DC
Start: ? — End: 2019-11-26

## 2019-11-26 MED ORDER — ALUM & MAG HYDROXIDE-SIMETH 400-400-40 MG/5ML PO SUSP
30.00 | ORAL | Status: DC
Start: ? — End: 2019-11-26

## 2019-11-26 MED ORDER — NALOXONE HCL 4 MG/10ML IJ SOLN
0.10 | INTRAMUSCULAR | Status: DC
Start: ? — End: 2019-11-26

## 2019-11-26 MED ORDER — ATORVASTATIN CALCIUM 40 MG PO TABS
40.00 | ORAL_TABLET | ORAL | Status: DC
Start: 2019-11-27 — End: 2019-11-26

## 2019-11-26 MED ORDER — MELATONIN 3 MG PO TABS
3.00 | ORAL_TABLET | ORAL | Status: DC
Start: ? — End: 2019-11-26

## 2019-11-26 MED ORDER — ISOSORBIDE DINITRATE 20 MG PO TABS
20.00 | ORAL_TABLET | ORAL | Status: DC
Start: 2019-11-26 — End: 2019-11-26

## 2019-11-26 MED ORDER — GENERIC EXTERNAL MEDICATION
2.00 | Status: DC
Start: 2019-11-26 — End: 2019-11-26

## 2019-11-26 MED ORDER — HEPARIN SODIUM (PORCINE) 5000 UNIT/ML IJ SOLN
5000.00 | INTRAMUSCULAR | Status: DC
Start: 2019-11-26 — End: 2019-11-26

## 2019-11-26 MED ORDER — GUAIFENESIN 100 MG/5ML PO SYRP
200.00 | ORAL_SOLUTION | ORAL | Status: DC
Start: ? — End: 2019-11-26

## 2019-11-26 MED ORDER — GENERIC EXTERNAL MEDICATION
Status: DC
Start: ? — End: 2019-11-26

## 2019-11-26 MED ORDER — INFLUENZA VAC SPLIT QUAD 0.5 ML IM SUSY
0.50 | PREFILLED_SYRINGE | INTRAMUSCULAR | Status: DC
Start: ? — End: 2019-11-26

## 2019-11-26 MED ORDER — MIRTAZAPINE 15 MG PO TABS
15.00 | ORAL_TABLET | ORAL | Status: DC
Start: 2019-11-26 — End: 2019-11-26

## 2019-11-26 MED ORDER — MAGNESIUM OXIDE 400 MG PO TABS
200.00 | ORAL_TABLET | ORAL | Status: DC
Start: 2019-11-27 — End: 2019-11-26

## 2019-11-26 MED ORDER — POLYETHYLENE GLYCOL 3350 17 GM/SCOOP PO POWD
17.00 | ORAL | Status: DC
Start: 2019-11-27 — End: 2019-11-26

## 2019-11-26 MED ORDER — ASPIRIN 81 MG PO CHEW
81.00 | CHEWABLE_TABLET | ORAL | Status: DC
Start: 2019-11-27 — End: 2019-11-26

## 2019-11-26 MED ORDER — DEXTROSE 50 % IV SOLN
12.50 | INTRAVENOUS | Status: DC
Start: ? — End: 2019-11-26

## 2019-11-26 MED ORDER — NICOTINE 7 MG/24HR TD PT24
1.00 | MEDICATED_PATCH | TRANSDERMAL | Status: DC
Start: 2019-11-27 — End: 2019-11-26

## 2019-11-26 MED ORDER — MEXILETINE HCL 150 MG PO CAPS
300.00 | ORAL_CAPSULE | ORAL | Status: DC
Start: 2019-11-26 — End: 2019-11-26

## 2019-11-26 MED ORDER — HYDRALAZINE HCL 25 MG PO TABS
37.50 | ORAL_TABLET | ORAL | Status: DC
Start: 2019-11-26 — End: 2019-11-26

## 2019-11-26 MED ORDER — INSULIN LISPRO 100 UNIT/ML ~~LOC~~ SOLN
0.00 | SUBCUTANEOUS | Status: DC
Start: 2019-11-26 — End: 2019-11-26

## 2019-11-26 MED ORDER — METRONIDAZOLE 500 MG PO TABS
500.00 | ORAL_TABLET | ORAL | Status: DC
Start: 2019-11-26 — End: 2019-11-26

## 2019-11-26 MED ORDER — ACETAMINOPHEN 325 MG PO TABS
650.00 | ORAL_TABLET | ORAL | Status: DC
Start: ? — End: 2019-11-26

## 2019-11-26 MED ORDER — PNEUMOCOCCAL VAC POLYVALENT 25 MCG/0.5ML IJ INJ
0.50 | INJECTION | INTRAMUSCULAR | Status: DC
Start: ? — End: 2019-11-26

## 2020-01-14 ENCOUNTER — Encounter (HOSPITAL_COMMUNITY): Payer: Medicare Other | Admitting: Internal Medicine

## 2020-01-14 ENCOUNTER — Ambulatory Visit (HOSPITAL_COMMUNITY): Admission: RE | Admit: 2020-01-14 | Payer: Medicare Other | Source: Ambulatory Visit

## 2020-01-14 MED ORDER — SACUBITRIL-VALSARTAN 49-51 MG PO TABS
1.00 | ORAL_TABLET | ORAL | Status: DC
Start: 2020-01-14 — End: 2020-01-14

## 2020-01-14 MED ORDER — FUROSEMIDE 40 MG PO TABS
40.00 | ORAL_TABLET | ORAL | Status: DC
Start: 2020-01-14 — End: 2020-01-14

## 2020-01-14 MED ORDER — ACETAMINOPHEN 325 MG PO TABS
650.00 | ORAL_TABLET | ORAL | Status: DC
Start: ? — End: 2020-01-14

## 2020-01-14 MED ORDER — ASPIRIN 81 MG PO CHEW
81.00 | CHEWABLE_TABLET | ORAL | Status: DC
Start: 2020-01-14 — End: 2020-01-14

## 2020-01-14 MED ORDER — LIDOCAINE 5 % EX PTCH
1.00 | MEDICATED_PATCH | CUTANEOUS | Status: DC
Start: 2020-01-14 — End: 2020-01-14

## 2020-01-14 MED ORDER — MELATONIN 3 MG PO TABS
3.00 | ORAL_TABLET | ORAL | Status: DC
Start: 2020-01-14 — End: 2020-01-14

## 2020-01-14 MED ORDER — ATORVASTATIN CALCIUM 40 MG PO TABS
40.00 | ORAL_TABLET | ORAL | Status: DC
Start: 2020-01-14 — End: 2020-01-14

## 2020-01-14 MED ORDER — MEXILETINE HCL 150 MG PO CAPS
300.00 | ORAL_CAPSULE | ORAL | Status: DC
Start: 2020-01-14 — End: 2020-01-14

## 2020-01-14 MED ORDER — APIXABAN 2.5 MG PO TABS
2.50 | ORAL_TABLET | ORAL | Status: DC
Start: 2020-01-14 — End: 2020-01-14

## 2020-01-14 MED ORDER — FLUTICASONE FUROATE-VILANTEROL 100-25 MCG/INH IN AEPB
1.00 | INHALATION_SPRAY | RESPIRATORY_TRACT | Status: DC
Start: 2020-01-14 — End: 2020-01-14

## 2020-01-14 MED ORDER — NICOTINE 14 MG/24HR TD PT24
1.00 | MEDICATED_PATCH | TRANSDERMAL | Status: DC
Start: ? — End: 2020-01-14

## 2020-01-14 MED ORDER — GENERIC EXTERNAL MEDICATION
Status: DC
Start: ? — End: 2020-01-14

## 2020-01-14 MED ORDER — MIRTAZAPINE 15 MG PO TABS
15.00 | ORAL_TABLET | ORAL | Status: DC
Start: 2020-01-14 — End: 2020-01-14

## 2020-02-04 ENCOUNTER — Other Ambulatory Visit (HOSPITAL_COMMUNITY): Payer: Self-pay | Admitting: Internal Medicine

## 2020-03-18 ENCOUNTER — Emergency Department: Payer: Medicare Other

## 2020-03-18 ENCOUNTER — Other Ambulatory Visit: Payer: Self-pay

## 2020-03-18 ENCOUNTER — Encounter: Payer: Self-pay | Admitting: Emergency Medicine

## 2020-03-18 ENCOUNTER — Emergency Department
Admission: EM | Admit: 2020-03-18 | Discharge: 2020-03-18 | Disposition: A | Discharge status: Home / Self Care | Payer: Medicare Other | Attending: Emergency Medicine | Admitting: Emergency Medicine

## 2020-03-18 DIAGNOSIS — Y999 Unspecified external cause status: Secondary | ICD-10-CM | POA: Insufficient documentation

## 2020-03-18 DIAGNOSIS — R0781 Pleurodynia: Secondary | ICD-10-CM | POA: Diagnosis not present

## 2020-03-18 DIAGNOSIS — S46911A Strain of unspecified muscle, fascia and tendon at shoulder and upper arm level, right arm, initial encounter: Secondary | ICD-10-CM | POA: Insufficient documentation

## 2020-03-18 DIAGNOSIS — J45909 Unspecified asthma, uncomplicated: Secondary | ICD-10-CM | POA: Diagnosis not present

## 2020-03-18 DIAGNOSIS — E119 Type 2 diabetes mellitus without complications: Secondary | ICD-10-CM | POA: Insufficient documentation

## 2020-03-18 DIAGNOSIS — Y9389 Activity, other specified: Secondary | ICD-10-CM | POA: Diagnosis not present

## 2020-03-18 DIAGNOSIS — Y92410 Unspecified street and highway as the place of occurrence of the external cause: Secondary | ICD-10-CM | POA: Insufficient documentation

## 2020-03-18 DIAGNOSIS — M7918 Myalgia, other site: Secondary | ICD-10-CM

## 2020-03-18 DIAGNOSIS — S4991XA Unspecified injury of right shoulder and upper arm, initial encounter: Secondary | ICD-10-CM | POA: Diagnosis present

## 2020-03-18 DIAGNOSIS — Z041 Encounter for examination and observation following transport accident: Secondary | ICD-10-CM | POA: Insufficient documentation

## 2020-03-18 DIAGNOSIS — N183 Chronic kidney disease, stage 3 unspecified: Secondary | ICD-10-CM | POA: Diagnosis not present

## 2020-03-18 DIAGNOSIS — J449 Chronic obstructive pulmonary disease, unspecified: Secondary | ICD-10-CM | POA: Diagnosis not present

## 2020-03-18 DIAGNOSIS — I2510 Atherosclerotic heart disease of native coronary artery without angina pectoris: Secondary | ICD-10-CM | POA: Insufficient documentation

## 2020-03-18 DIAGNOSIS — I129 Hypertensive chronic kidney disease with stage 1 through stage 4 chronic kidney disease, or unspecified chronic kidney disease: Secondary | ICD-10-CM | POA: Diagnosis not present
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
## 2020-03-18 NOTE — ED Notes (Signed)
Pt states his ride will be here soon and is requesting to sit outside and wait for ride. This RN offered to call pts ride and pt states that is not necessary he would just wait because he does not know the phone number.
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
## 2020-05-01 ENCOUNTER — Other Ambulatory Visit (HOSPITAL_COMMUNITY): Payer: Self-pay | Admitting: Internal Medicine

## 2020-05-06 ENCOUNTER — Other Ambulatory Visit (HOSPITAL_COMMUNITY): Payer: Self-pay | Admitting: Internal Medicine

## 2020-05-25 ENCOUNTER — Other Ambulatory Visit (HOSPITAL_COMMUNITY): Payer: Self-pay | Admitting: Adult Health

## 2020-06-03 ENCOUNTER — Other Ambulatory Visit (HOSPITAL_COMMUNITY): Payer: Self-pay | Admitting: Internal Medicine

## 2020-06-07 ENCOUNTER — Other Ambulatory Visit (HOSPITAL_COMMUNITY): Payer: Self-pay | Admitting: Internal Medicine

## 2020-06-11 ENCOUNTER — Other Ambulatory Visit (HOSPITAL_COMMUNITY): Payer: Self-pay | Admitting: Internal Medicine

## 2020-10-10 ENCOUNTER — Encounter: Payer: Medicare Other | Attending: Physician Assistant | Admitting: Physician Assistant

## 2020-10-10 ENCOUNTER — Other Ambulatory Visit: Payer: Self-pay

## 2020-10-10 DIAGNOSIS — N183 Chronic kidney disease, stage 3 unspecified: Secondary | ICD-10-CM | POA: Diagnosis not present

## 2020-10-10 DIAGNOSIS — E11621 Type 2 diabetes mellitus with foot ulcer: Secondary | ICD-10-CM | POA: Diagnosis not present

## 2020-10-10 DIAGNOSIS — I509 Heart failure, unspecified: Secondary | ICD-10-CM | POA: Diagnosis not present

## 2020-10-10 DIAGNOSIS — Z87891 Personal history of nicotine dependence: Secondary | ICD-10-CM | POA: Diagnosis not present

## 2020-10-10 DIAGNOSIS — I130 Hypertensive heart and chronic kidney disease with heart failure and stage 1 through stage 4 chronic kidney disease, or unspecified chronic kidney disease: Secondary | ICD-10-CM | POA: Insufficient documentation

## 2020-10-10 DIAGNOSIS — L97522 Non-pressure chronic ulcer of other part of left foot with fat layer exposed: Secondary | ICD-10-CM | POA: Diagnosis not present

## 2020-10-10 DIAGNOSIS — I252 Old myocardial infarction: Secondary | ICD-10-CM | POA: Diagnosis not present

## 2020-10-10 DIAGNOSIS — E1122 Type 2 diabetes mellitus with diabetic chronic kidney disease: Secondary | ICD-10-CM | POA: Diagnosis not present

## 2020-10-10 DIAGNOSIS — I480 Paroxysmal atrial fibrillation: Secondary | ICD-10-CM | POA: Diagnosis not present

## 2020-10-10 DIAGNOSIS — E1140 Type 2 diabetes mellitus with diabetic neuropathy, unspecified: Secondary | ICD-10-CM | POA: Diagnosis not present

## 2020-10-10 DIAGNOSIS — Z7901 Long term (current) use of anticoagulants: Secondary | ICD-10-CM | POA: Insufficient documentation

## 2020-10-13 NOTE — Progress Notes (Signed)
[name] [name], [name] [name] ([id number]) Visit Report for 10/10/2020 Abuse/Suicide Risk Screen Details Patient Name: [name] [name], [name] [name]. Date of Service: 10/10/2020 10:30 AM Medical Record Number: [id number] Patient Account Number: [id number] Date of Birth/Sex: [birth year] (86 y.o. Male) Treating RN: Yevonne Pax Primary Care Bailley Guilford: Gorden Harms Other Clinician: Referring Danniella Robben: Gorden Harms Treating Bethanny Toelle/Extender: Rowan Blase in Treatment: 0 Abuse/Suicide Risk Screen Items Answer ABUSE RISK SCREEN: Has anyone close to you tried to hurt or harm you recentlyo No Do you feel uncomfortable with anyone in your familyo No Has anyone forced you do things that you didnot want to doo No Electronic Signature(s) Signed: 10/13/2020 7:56:30 AM By: Yevonne Pax RN Entered By: Yevonne Pax on 10/10/2020 11:21:49 [name] [name] ([id number]) -------------------------------------------------------------------------------- Activities of Daily Living Details Patient Name: [name] [name]. Date of Service: 10/10/2020 10:30 AM Medical Record Number: [id number] Patient Account Number: [id number] Date of Birth/Sex: October 13, 1934 (86 y.o. Male) Treating RN: Yevonne Pax Primary Care Nikolos Billig: Gorden Harms Other Clinician: Referring Julious Langlois: Gorden Harms Treating Anice Wilshire/Extender: Rowan Blase in Treatment: 0 Activities of Daily Living Items Answer Activities of Daily Living (Please select one for each item) Drive Automobile Not Able Take Medications Need Assistance Use Telephone Completely Able Care for Appearance Completely Able Use Toilet Completely Able Bath / Shower Completely Able Dress Self Completely Able Feed Self Completely Able Walk Completely Able Get In / Out Bed Completely Able Housework Completely Able Prepare Meals Completely Able Handle Money Not Able Shop for Self Not Able Electronic Signature(s) Signed: 10/13/2020 7:56:30 AM By: Yevonne Pax RN Entered  By: Yevonne Pax on 10/10/2020 11:22:48 [name] [name] ([id number]) -------------------------------------------------------------------------------- Education Screening Details Patient Name: [name] [name]. Date of Service: 10/10/2020 10:30 AM Medical Record Number: [id number] Patient Account Number: [id number] Date of Birth/Sex: 11/30/1934 (86 y.o. Male) Treating RN: Yevonne Pax Primary Care Culver Feighner: Gorden Harms Other Clinician: Referring Natacha Jepsen: Gorden Harms Treating Rodell Marrs/Extender: Rowan Blase in Treatment: 0 Primary Learner Assessed: Patient Learning Preferences/Education Level/Primary Language Learning Preference: Explanation Highest Education Level: Grade School Preferred Language: English Cognitive Barrier Language Barrier: No Translator Needed: No Memory Deficit: No Emotional Barrier: No Cultural/Religious Beliefs Affecting Medical Care: No Physical Barrier Impaired Vision: Yes Glasses Impaired Hearing: No Decreased Hand dexterity: No Knowledge/Comprehension Knowledge Level: Medium Comprehension Level: Medium Ability to understand written instructions: Medium Ability to understand verbal instructions: Medium Motivation Anxiety Level: Anxious Cooperation: Cooperative Education Importance: Acknowledges Need Interest in Health Problems: Asks Questions Perception: Coherent Willingness to Engage in Self-Management Medium Activities: Readiness to Engage in Self-Management Medium Activities: Electronic Signature(s) Signed: 10/13/2020 7:56:30 AM By: Yevonne Pax RN Entered By: Yevonne Pax on 10/10/2020 11:23:35 [name] [name] ([id number]) -------------------------------------------------------------------------------- Fall Risk Assessment Details Patient Name: [name] [name]. Date of Service: 10/10/2020 10:30 AM Medical Record Number: [id number] Patient Account Number: [id number] Date of Birth/Sex: 1935-07-14 (86 y.o. Male) Treating  RN: Yevonne Pax Primary Care Merion Caton: Gorden Harms Other Clinician: Referring Eren Puebla: Gorden Harms Treating Oziel Beitler/Extender: Rowan Blase in Treatment: 0 Fall Risk Assessment Items Have you had 2 or more falls in the last 12 monthso 0 No Have you had any fall that resulted in injury in the last 12 monthso 0 No FALLS RISK SCREEN History of falling - immediate or within 3 months 0 No Secondary diagnosis (Do you have 2 or more medical diagnoseso) 0 No Ambulatory aid None/bed rest/wheelchair/nurse 0 No Crutches/cane/walker 0 No Furniture 0 No Intravenous therapy Access/Saline/Heparin Lock 0 No Gait/Transferring Normal/ bed rest/ wheelchair 0 No Weak (short steps with  or without shuffle, stooped but able to lift head while walking, may 0 No seek support from furniture) Impaired (short steps with shuffle, may have difficulty arising from chair, head down, impaired 0 No balance) Mental Status Oriented to own ability 0 No Electronic Signature(s) Signed: 10/13/2020 7:56:30 AM By: Yevonne Pax RN Entered By: Yevonne Pax on 10/10/2020 11:24:35 [name] [name] ([id number]) -------------------------------------------------------------------------------- Foot Assessment Details Patient Name: [name] [name]. Date of Service: 10/10/2020 10:30 AM Medical Record Number: [id number] Patient Account Number: [id number] Date of Birth/Sex: 02-02-1935 (86 y.o. Male) Treating RN: Yevonne Pax Primary Care Shaquel Josephson: Gorden Harms Other Clinician: Referring Aeron Lheureux: Gorden Harms Treating Yanixan Mellinger/Extender: Rowan Blase in Treatment: 0 Foot Assessment Items Site Locations + = Sensation present, - = Sensation absent, C = Callus, U = Ulcer R = Redness, W = Warmth, M = Maceration, PU = Pre-ulcerative lesion F = Fissure, S = Swelling, D = Dryness Assessment Right: Left: Other Deformity: No No Prior Foot Ulcer: Yes No Prior Amputation: Yes Yes Charcot Joint: No  No Ambulatory Status: Ambulatory Without Help Gait: Steady Electronic Signature(s) Signed: 10/13/2020 7:56:30 AM By: Yevonne Pax RN Entered By: Yevonne Pax on 10/10/2020 11:27:13 [name] [name] ([id number]) -------------------------------------------------------------------------------- Nutrition Risk Screening Details Patient Name: [name] [name]. Date of Service: 10/10/2020 10:30 AM Medical Record Number: [id number] Patient Account Number: [id number] Date of Birth/Sex: 10/08/1934 (86 y.o. Male) Treating RN: Yevonne Pax Primary Care Arland Usery: Gorden Harms Other Clinician: Referring Kinston Magnan: Gorden Harms Treating Laurine Kuyper/Extender: Rowan Blase in Treatment: 0 Height (in): 70 Weight (lbs): 151 Body Mass Index (BMI): 21.7 Nutrition Risk Screening Items Score Screening NUTRITION RISK SCREEN: I have an illness or condition that made me change the kind and/or amount of food I eat 0 No I eat fewer than two meals per day 0 No I eat few fruits and vegetables, or milk products 0 No I have three or more drinks of beer, liquor or wine almost every day 0 No I have tooth or mouth problems that make it hard for me to eat 0 No I don't always have enough money to buy the food I need 0 No I eat alone most of the time 0 No I take three or more different prescribed or over-the-counter drugs a day 1 Yes Without wanting to, I have lost or gained 10 pounds in the last six months 0 No I am not always physically able to shop, cook and/or feed myself 0 No Nutrition Protocols Good Risk Protocol 0 No interventions needed Moderate Risk Protocol High Risk Proctocol Risk Level: Good Risk Score: 1 Electronic Signature(s) Signed: 10/13/2020 7:56:30 AM By: Yevonne Pax RN Entered By: Yevonne Pax on 10/10/2020 11:24:48

## 2020-10-13 NOTE — Progress Notes (Signed)
RYMAN, RATHGEBER ([id number]) Visit Report for 10/10/2020 Allergy List Details Patient Name: [name] [name], [name] [name]. Date of Service: 10/10/2020 10:30 AM Medical Record Number: [id number] Patient Account Number: [id number] Date of Birth/Sex: 1934/10/29 (86 y.o. Male) Treating RN: Yevonne Pax Primary Care Anthoni Geerts: Gorden Harms Other Clinician: Referring Quin Mathenia: Gorden Harms Treating Leandro Berkowitz/Extender: Allen Derry Weeks in Treatment: 0 Allergies Active Allergies No Known Allergies Type: Allergen Allergy Notes Electronic Signature(s) Signed: 10/13/2020 7:56:30 AM By: Yevonne Pax RN Entered By: Yevonne Pax on 10/10/2020 11:17:09 [name] [name] ([id number]) -------------------------------------------------------------------------------- Arrival Information Details Patient Name: [name] [name] Date of Service: 10/10/2020 10:30 AM Medical Record Number: [id number] Patient Account Number: [id number] Date of Birth/Sex: 1935/07/12 (86 y.o. Male) Treating RN: Huel Coventry Primary Care Lillyanna Glandon: Gorden Harms Other Clinician: Referring Shakyla Nolley: Gorden Harms Treating Halley Shepheard/Extender: Rowan Blase in Treatment: 0 Visit Information Patient Arrived: Danella Maiers Time: 10:48 Accompanied By: niece Transfer Assistance: None Patient Identification Verified: Yes Secondary Verification Process Completed: Yes Patient Requires Transmission-Based No Precautions: Patient Has Alerts: Yes Patient Alerts: Patient on Blood Thinner NON COMPRESSABLE Electronic Signature(s) Signed: 10/13/2020 7:56:30 AM By: Yevonne Pax RN Entered By: Yevonne Pax on 10/10/2020 11:54:57 [name] [name], [name] [name] ([id number]) -------------------------------------------------------------------------------- Clinic Level of Care Assessment Details Patient Name: [name] [name]. Date of Service: 10/10/2020 10:30 AM Medical Record Number: [id number] Patient Account Number: [id number] Date of Birth/Sex:  1935-03-08 (86 y.o. Male) Treating RN: Yevonne Pax Primary Care Samariah Hokenson: Gorden Harms Other Clinician: Referring Leshia Kope: Gorden Harms Treating Rumeal Cullipher/Extender: Rowan Blase in Treatment: 0 Clinic Level of Care Assessment Items TOOL 2 Quantity Score X - Use when only an EandM is performed on the INITIAL visit 1 0 ASSESSMENTS - Nursing Assessment / Reassessment X - General Physical Exam (combine w/ comprehensive assessment (listed just below) when performed on new 1 20 pt. evals) X- 1 25 Comprehensive Assessment (HX, ROS, Risk Assessments, Wounds Hx, etc.) ASSESSMENTS - Wound and Skin Assessment / Reassessment X - Simple Wound Assessment / Reassessment - one wound 1 5 []  - 0 Complex Wound Assessment / Reassessment - multiple wounds []  - 0 Dermatologic / Skin Assessment (not related to wound area) ASSESSMENTS - Ostomy and/or Continence Assessment and Care []  - Incontinence Assessment and Management 0 []  - 0 Ostomy Care Assessment and Management (repouching, etc.) PROCESS - Coordination of Care X - Simple Patient / Family Education for ongoing care 1 15 []  - 0 Complex (extensive) Patient / Family Education for ongoing care X- 1 10 Staff obtains , Records, Test Results / Process Orders []  - 0 Staff telephones HHA, Nursing Homes / Clarify orders / etc []  - 0 Routine Transfer to another Facility (non-emergent condition) []  - 0 Routine Hospital Admission (non-emergent condition) X- 1 15 New Admissions / / Ordering NPWT, Apligraf, etc. []  - 0 Emergency Hospital Admission (emergent condition) X- 1 10 Simple Discharge Coordination []  - 0 Complex (extensive) Discharge Coordination PROCESS - Special Needs []  - Pediatric / Minor Patient Management 0 []  - 0 Isolation Patient Management []  - 0 Hearing / Language / Visual special needs []  - 0 Assessment of Community assistance (transportation, D/C planning, etc.) []  - 0 Additional  assistance / Altered mentation []  - 0 Support Surface(s) Assessment (bed, cushion, seat, etc.) INTERVENTIONS - Wound Cleansing / Measurement X - Wound Imaging (photographs - any number of wounds) 1 5 []  - 0 Wound Tracing (instead of photographs) X- 1 5 Simple Wound Measurement - one wound []  - 0 Complex Wound Measurement - multiple  wounds [name] [name], [name] [name] ([id number]) X- 1 5 Simple Wound Cleansing - one wound []  - 0 Complex Wound Cleansing - multiple wounds INTERVENTIONS - Wound Dressings X - Small Wound Dressing one or multiple wounds 1 10 []  - 0 Medium Wound Dressing one or multiple wounds []  - 0 Large Wound Dressing one or multiple wounds []  - 0 Application of Medications - injection INTERVENTIONS - Miscellaneous []  - External ear exam 0 []  - 0 Specimen Collection (cultures, biopsies, blood, body fluids, etc.) []  - 0 Specimen(s) / Culture(s) sent or taken to Lab for analysis []  - 0 Patient Transfer (multiple staff / Michiel Sites Lift / Similar devices) []  - 0 Simple Staple / Suture removal (25 or less) []  - 0 Complex Staple / Suture removal (26 or more) []  - 0 Hypo / Hyperglycemic Management (close monitor of Blood Glucose) X- 1 15 Ankle / Brachial Index (ABI) - do not check if billed separately Has the patient been seen at the hospital within the last three years: Yes Total Score: 140 Level Of Care: New/Established - Level 4 Electronic Signature(s) Signed: 10/13/2020 7:56:30 AM By: Yevonne Pax RN Entered By: Yevonne Pax on 10/10/2020 11:57:59 [name] [name] ([id number]) -------------------------------------------------------------------------------- Encounter Discharge Information Details Patient Name: [name] [name]. Date of Service: 10/10/2020 10:30 AM Medical Record Number: [id number] Patient Account Number: [id number] Date of Birth/Sex: Jul 29, 1935 (86 y.o. Male) Treating RN: Yevonne Pax Primary Care Merilyn Pagan: Gorden Harms Other Clinician: Referring  Kasim Mccorkle: Gorden Harms Treating Kethan Papadopoulos/Extender: Rowan Blase in Treatment: 0 Encounter Discharge Information Items Discharge Condition: Stable Ambulatory Status: Cane Discharge Destination: Home Transportation: Private Auto Accompanied By: Huel Cote Schedule Follow-up Appointment: Yes Clinical Summary of Care: Patient Declined Electronic Signature(s) Signed: 10/13/2020 7:56:30 AM By: Yevonne Pax RN Entered By: Yevonne Pax on 10/10/2020 11:59:07 [name] [name] ([id number]) -------------------------------------------------------------------------------- Lower Extremity Assessment Details Patient Name: [name] [name], [name] [name]. Date of Service: 10/10/2020 10:30 AM Medical Record Number: [id number] Patient Account Number: [id number] Date of Birth/Sex: [birth year] (86 y.o. Male) Treating RN: Yevonne Pax Primary Care Nitisha Civello: Gorden Harms Other Clinician: Referring Temima Kutsch: Gorden Harms Treating Cosandra Plouffe/Extender: Rowan Blase in Treatment: 0 Edema Assessment Assessed: [Left: No] [Right: No] Edema: [Left: Ye] [Right: s] Calf Left: Right: Point of Measurement: 42 cm From Medial Instep 29 cm Ankle Left: Right: Point of Measurement: 10 cm From Medial Instep 20 cm Knee To Floor Left: Right: From Medial Instep 49 cm Vascular Assessment Pulses: Dorsalis Pedis Palpable: [Left:Yes] Notes non compressable Electronic Signature(s) Signed: 10/13/2020 7:56:30 AM By: Yevonne Pax RN Entered By: Yevonne Pax on 10/10/2020 11:16:37 Sizer, [name] [name] ([id number]) -------------------------------------------------------------------------------- Multi Wound Chart Details Patient Name: [name] [name]. Date of Service: 10/10/2020 10:30 AM Medical Record Number: [id number] Patient Account Number: [id number] Date of Birth/Sex: 10-03-1934 (86 y.o. Male) Treating RN: Yevonne Pax Primary Care Carrisa Keller: Gorden Harms Other Clinician: Referring Berlyn Saylor: Gorden Harms Treating Audreena Sachdeva/Extender: Rowan Blase in Treatment: 0 Vital Signs Height(in): 70 Pulse(bpm): 74 Weight(lbs): 151 Blood Pressure(mmHg): 185/79 Body Mass Index(BMI): 22 Temperature(F): 98.2 Respiratory Rate(breaths/min): 16 Wound Assessments Treatment Notes Electronic Signature(s) Signed: 10/13/2020 7:56:30 AM By: Yevonne Pax RN Entered By: Yevonne Pax on 10/10/2020 11:38:44 [name] [name] ([id number]) -------------------------------------------------------------------------------- Multi-Disciplinary Care Plan Details Patient Name: [name] [name]. Date of Service: 10/10/2020 10:30 AM Medical Record Number: [id number] Patient Account Number: [id number] Date of Birth/Sex: 1935-06-07 (86 y.o. Male) Treating RN: Yevonne Pax Primary Care Grahm Etsitty: Gorden Harms Other Clinician: Referring Lucienne Sawyers: Gorden Harms Treating Jivan Symanski/Extender: Rowan Blase in Treatment: 0 Active Inactive  Wound/Skin Impairment Nursing Diagnoses: Knowledge deficit related to ulceration/compromised skin integrity Goals: Patient/caregiver will verbalize understanding of skin care regimen Date Initiated: 10/10/2020 Target Resolution Date: 11/10/2020 Goal Status: Active Ulcer/skin breakdown will have a volume reduction of 30% by week 4 Date Initiated: 10/10/2020 Target Resolution Date: 11/10/2020 Goal Status: Active Ulcer/skin breakdown will have a volume reduction of 50% by week 8 Date Initiated: 10/10/2020 Target Resolution Date: 12/08/2020 Goal Status: Active Ulcer/skin breakdown will have a volume reduction of 80% by week 12 Date Initiated: 10/10/2020 Target Resolution Date: 01/08/2021 Goal Status: Active Ulcer/skin breakdown will heal within 14 weeks Date Initiated: 10/10/2020 Target Resolution Date: 02/07/2021 Goal Status: Active Interventions: Assess patient/caregiver ability to obtain necessary supplies Assess patient/caregiver ability to perform ulcer/skin care  regimen upon admission and as needed Assess ulceration(s) every visit Notes: Electronic Signature(s) Signed: 10/13/2020 7:56:30 AM By: Yevonne Pax RN Entered By: Yevonne Pax on 10/10/2020 11:38:12 Ports, [name] [name] ([id number]) -------------------------------------------------------------------------------- Pain Assessment Details Patient Name: [name] [name]. Date of Service: 10/10/2020 10:30 AM Medical Record Number: [id number] Patient Account Number: [id number] Date of Birth/Sex: [birth year] (86 y.o. Male) Treating RN: Huel Coventry Primary Care Kenise Barraco: Gorden Harms Other Clinician: Referring Leotta Weingarten: Gorden Harms Treating Nandini Bogdanski/Extender: Rowan Blase in Treatment: 0 Active Problems Location of Pain Severity and Description of Pain Patient Has Paino No Site Locations Pain Management and Medication Current Pain Management: Electronic Signature(s) Signed: 10/10/2020 3:17:51 PM By: Sallee Provencal, RRT, CHT Signed: 10/10/2020 5:21:46 PM By: Elliot Gurney, BSN, RN, CWS, Kim RN, BSN Entered By: Dayton Martes on 10/10/2020 10:49:25 [name] [name] ([id number]) -------------------------------------------------------------------------------- Patient/Caregiver Education Details Patient Name: [name] [name] Date of Service: 10/10/2020 10:30 AM Medical Record Number: [id number] Patient Account Number: [id number] Date of Birth/Gender: 07-Mar-1935 (86 y.o. Male) Treating RN: Yevonne Pax Primary Care Physician: Gorden Harms Other Clinician: Referring Physician: Gorden Harms Treating Physician/Extender: Rowan Blase in Treatment: 0 Education Assessment Education Provided To: Patient Education Topics Provided Wound/Skin Impairment: Methods: Explain/Verbal Responses: State content correctly Electronic Signature(s) Signed: 10/13/2020 7:56:30 AM By: Yevonne Pax RN Entered By: Yevonne Pax on 10/10/2020 11:58:15 [name] [name]  ([id number]) -------------------------------------------------------------------------------- Wound Assessment Details Patient Name: [name] [name]. Date of Service: 10/10/2020 10:30 AM Medical Record Number: [id number] Patient Account Number: [id number] Date of Birth/Sex: July 27, 1935 (86 y.o. Male) Treating RN: Yevonne Pax Primary Care Darick Fetters: Gorden Harms Other Clinician: Referring Jeraline Marcinek: Gorden Harms Treating Tashima Scarpulla/Extender: Rowan Blase in Treatment: 0 Wound Status Wound Number: 1 Primary Diabetic Wound/Ulcer of the Lower Extremity Etiology: Wound Location: Left, Lateral Foot Wound Open Wounding Event: Gradually Appeared Status: Date Acquired: 09/14/2019 Comorbid Chronic Obstructive Pulmonary Disease (COPD), Weeks Of Treatment: 0 History: Congestive Heart Failure, Hypertension, Myocardial Clustered Wound: No Infarction, Type II Diabetes Photos Wound Measurements Length: (cm) 2 Width: (cm) 2.1 Depth: (cm) 0.2 Area: (cm) 3.299 Volume: (cm) 0.66 % Reduction in Area: % Reduction in Volume: Epithelialization: None Tunneling: No Undermining: No Wound Description Classification: Grade 2 Exudate Amount: Medium Exudate Type: Serosanguineous Exudate Color: red, brown Foul Odor After Cleansing: No Slough/Fibrino Yes Wound Bed Granulation Amount: Large (67-100%) Exposed Structure Granulation Quality: Red Fascia Exposed: No Necrotic Amount: Small (1-33%) Fat Layer (Subcutaneous Tissue) Exposed: Yes Necrotic Quality: Adherent Slough Tendon Exposed: No Muscle Exposed: No Joint Exposed: No Bone Exposed: No Treatment Notes Wound #1 (Foot) Wound Laterality: Left, Lateral Cleanser Normal Saline Discharge Instruction: Wash your hands with soap and water. Remove old dressing, discard into plastic bag and place into trash. Cleanse the wound with  Normal Saline prior to applying a clean dressing using gauze sponges, not tissues or cotton balls. Do not scrub  or use excessive force. Pat dry using gauze sponges, not tissue or cotton balls. JAMIR, RONE ([id number]) Peri-Wound Care Topical Primary Dressing Prisma 4.34 (in) Discharge Instruction: Moisten w/normal saline or sterile water; Cover wound as directed. Do not remove from wound bed. Secondary Dressing Bordered Gauze Sterile-HBD 4x4 (in/in) Discharge Instruction: Cover wound with Bordered Guaze Sterile as directed Secured With Compression Wrap Compression Stockings Add-Ons Electronic Signature(s) Signed: 10/13/2020 7:56:30 AM By: Yevonne Pax RN Entered By: Yevonne Pax on 10/10/2020 11:53:29 Mcelwain, [name] [name] ([id number]) -------------------------------------------------------------------------------- Vitals Details Patient Name: [name] [name]. Date of Service: 10/10/2020 10:30 AM Medical Record Number: [id number] Patient Account Number: [id number] Date of Birth/Sex: [birth year] (86 y.o. Male) Treating RN: Yevonne Pax Primary Care Obbie Lewallen: Gorden Harms Other Clinician: Referring Yarielys Beed: Gorden Harms Treating Kaydan Wilhoite/Extender: Rowan Blase in Treatment: 0 Vital Signs Time Taken: 10:58 Temperature (F): 98.2 Height (in): 70 Pulse (bpm): 74 Source: Stated Respiratory Rate (breaths/min): 16 Weight (lbs): 151 Blood Pressure (mmHg): 185/79 Source: Stated Reference Range: 80 - 120 mg / dl Body Mass Index (BMI): 21.7 Electronic Signature(s) Signed: 10/13/2020 7:56:30 AM By: Yevonne Pax RN Entered By: Yevonne Pax on 10/10/2020 10:58:53

## 2020-10-13 NOTE — Progress Notes (Signed)
[name] [name], Manual L. ([id number]) Visit Report for 10/10/2020 Chief Complaint Document Details Patient Name: [name] [name], [name] L. Date of Service: 10/10/2020 10:30 AM Medical Record Number: [id number] Patient Account Number: [id number] Date of Birth/Sex: 07/11/1935 [age] y.o. Male) Treating RN: Huel CoventryWoody, Kim Primary Care Provider: Gorden HarmsMcManus, Brenna Other Clinician: Referring Provider: Gorden HarmsMcManus, Brenna Treating Provider/Extender: Rowan BlaseStone, Hoyt Weeks in Treatment: 0 Information Obtained from: Patient Chief Complaint Left foot ulcer Electronic Signature(s) Signed: 10/10/2020 11:32:49 AM By: Lenda KelpStone III, Hoyt PA-C Entered By: Lenda KelpStone III, Hoyt on 10/10/2020 11:32:49 [name] [name], Mercury L. ([id number]) -------------------------------------------------------------------------------- HPI Details Patient Name: [name] [name], [name] L. Date of Service: 10/10/2020 10:30 AM Medical Record Number: [id number] Patient Account Number: [id number] Date of Birth/Sex: 02/04/1935 [age] y.o. Male) Treating RN: Huel CoventryWoody, Kim Primary Care Provider: Gorden HarmsMcManus, Brenna Other Clinician: Referring Provider: Gorden HarmsMcManus, Brenna Treating Provider/Extender: Rowan BlaseStone, Hoyt Weeks in Treatment: 0 History of Present Illness HPI Description: 10/10/2020 upon evaluation today patient presents for initial evaluation in our clinic concerning a wound he has on the left lateral foot. 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 With that being said currently he has a wound portion of his left foot that is what we are seeing him for today everything else appears to actually be doing quite well which is great news. There does not appear to be any signs of active infection at this time which is also great news. He does have a history of diabetes mellitus type 2, chronic kidney disease stage III, hypertension,  paroxysmal atrial fibrillation, and long-term use of anticoagulant therapy due to the atrial fibrillation. He does not have any pain secondary to neuropathy. His most recent hemoglobin A1c was 6.5 roughly a year ago as best we can tell. He does have [facility] coming out currently to help take care of his wound. Electronic Signature(s) Signed: 10/10/2020 4:39:41 PM By: Lenda KelpStone III, Hoyt PA-C Entered By: Lenda KelpStone III, Hoyt on 10/10/2020 16:39:41 [name] [name], Janziel L. ([id number]) -------------------------------------------------------------------------------- Physical Exam Details Patient Name: [name] [name], [name] L. Date of Service: 10/10/2020 10:30 AM Medical Record Number: [id number] Patient Account Number: [id number] Date of Birth/Sex: 09/06/1935 [age] y.o. Male) Treating RN: Huel CoventryWoody, Kim Primary Care Provider: Gorden HarmsMcManus, Brenna Other Clinician: Referring Provider: Gorden HarmsMcManus, Brenna Treating Provider/Extender: Rowan BlaseStone, Hoyt Weeks in Treatment: 0 Constitutional patient is hypertensive.. pulse regular and within target range for patient.Marland Kitchen. respirations regular, non-labored and within target range for patient.Marland Kitchen. temperature within target range for patient.. Well-nourished and well-hydrated in no acute distress. Eyes conjunctiva clear no eyelid edema noted. pupils equal round and reactive to light and accommodation. Ears, Nose, Mouth, and Throat no gross abnormality of ear auricles or external auditory canals. normal hearing noted during conversation. mucus membranes moist. Respiratory normal breathing without difficulty. Musculoskeletal normal gait and posture. no significant deformity or arthritic changes, no loss or range of motion, no clubbing. Psychiatric this patient is able to make decisions and demonstrates good insight into disease process. Alert and Oriented x 3. pleasant and cooperative. Notes Upon inspection patient's wound bed actually showed signs of good granulation at this time. There does not  appear to be any evidence of active infection which is great news and overall I am extremely pleased with how things stand. I believe that a collagen-based dressing would be beneficial for the patient at this point. Electronic Signature(s) Signed: 10/10/2020 4:40:58 PM By: Lenda KelpStone III, Hoyt PA-C  Entered By: Lenda Kelp on 10/10/2020 16:40:57 [name] [name] ([id number]) -------------------------------------------------------------------------------- Physician Orders Details Patient Name: [name], [name]. Date of Service: 10/10/2020 10:30 AM Medical Record Number: [id number] Patient Account Number: [id number] Date of Birth/Sex: 1935/04/21 (86 y.o. Male) Treating RN: Yevonne Pax Primary Care Provider: Gorden Harms Other Clinician: Referring Provider: Gorden Harms Treating Provider/Extender: Rowan Blase in Treatment: 0 Verbal / Phone Orders: No Diagnosis Coding ICD-10 Coding Code Description E11.621 Type 2 diabetes mellitus with foot ulcer L97.522 Non-pressure chronic ulcer of other part of left foot with fat layer exposed N18.30 Chronic kidney disease, stage 3 unspecified I10 Essential (primary) hypertension I48.0 Paroxysmal atrial fibrillation Z79.01 Long term (current) use of anticoagulants Follow-up Appointments o Return Appointment in 2 weeks. Home Health o [facility] for wound care. May utilize formulary equivalent dressing for wound treatment orders unless otherwise specified. Home Health Nurse may visit PRN to address patientos wound care needs. - [facility] Bathing/ Shower/ Hygiene o Clean wound with Normal Saline or wound cleanser. o May shower with wound dressing protected with water repellent cover or cast protector. Edema Control - Lymphedema / Segmental Compressive Device / Other o Elevate, Exercise Daily and Avoid Standing for Long Periods of Time. o Elevate legs to the level of the heart and pump ankles as often as possible o  Elevate leg(s) parallel to the floor when sitting. Off-Loading Bilateral Lower Extremities o Open toe surgical shoe Wound Treatment Wound #1 - Foot Wound Laterality: Left, Lateral Cleanser: Normal Saline (Generic) 3 x Per Week/30 Days Discharge Instructions: Wash your hands with soap and water. Remove old dressing, discard into plastic bag and place into trash. Cleanse the wound with Normal Saline prior to applying a clean dressing using gauze sponges, not tissues or cotton balls. Do not scrub or use excessive force. Pat dry using gauze sponges, not tissue or cotton balls. Primary Dressing: Prisma 4.34 (in) (Generic) 3 x Per Week/30 Days Discharge Instructions: Moisten w/normal saline or sterile water; Cover wound as directed. Do not remove from wound bed. Secondary Dressing: Bordered Gauze Sterile-HBD 4x4 (in/in) (Generic) 3 x Per Week/30 Days Discharge Instructions: Cover wound with Bordered Guaze Sterile as directed Electronic Signature(s) Signed: 10/10/2020 4:54:01 PM By: Lenda Kelp PA-C Signed: 10/13/2020 7:56:30 AM By: Yevonne Pax RN Entered By: Yevonne Pax on 10/10/2020 11:57:24 Cegielski, [name] ([id number]) -------------------------------------------------------------------------------- Problem List Details Patient Name: DYSHAUN, BONZO. Date of Service: 10/10/2020 10:30 AM Medical Record Number: [id number] Patient Account Number: [id number] Date of Birth/Sex: [birth year] (86 y.o. Male) Treating RN: Huel Coventry Primary Care Provider: Gorden Harms Other Clinician: Referring Provider: Gorden Harms Treating Provider/Extender: Allen Derry Weeks in Treatment: 0 Active Problems ICD-10 Encounter Code Description Active Date MDM Diagnosis E11.621 Type 2 diabetes mellitus with foot ulcer 10/10/2020 No Yes L97.522 Non-pressure chronic ulcer of other part of left foot with fat layer 10/10/2020 No Yes exposed N18.30 Chronic kidney disease, stage 3 unspecified 10/10/2020 No  Yes I10 Essential (primary) hypertension 10/10/2020 No Yes I48.0 Paroxysmal atrial fibrillation 10/10/2020 No Yes Z79.01 Long term (current) use of anticoagulants 10/10/2020 No Yes Inactive Problems Resolved Problems Electronic Signature(s) Signed: 10/10/2020 11:32:39 AM By: Lenda Kelp PA-C Entered By: Lenda Kelp on 10/10/2020 11:32:38 [name] [name] ([id number]) -------------------------------------------------------------------------------- Progress Note Details Patient Name: [name] [name]. Date of Service: 10/10/2020 10:30 AM Medical Record Number: [id number] Patient Account Number: [id number] Date of Birth/Sex: Jan 31, 1935 (86 y.o. Male) Treating RN: Huel Coventry Primary Care Provider: Gorden Harms Other Clinician: Referring Provider:  Gorden Harms Treating Provider/Extender: Allen Derry Weeks in Treatment: 0 Subjective Chief Complaint Information obtained from Patient Left foot ulcer History of Present Illness (HPI) 10/10/2020 upon evaluation today patient presents for initial evaluation in our clinic concerning a wound he has on the left lateral foot. 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 With that being said currently he has a wound portion of his left foot that is what we are seeing him for today everything else appears to actually be doing quite well which is great news. There does not appear to be any signs of active infection at this time which is also great news. He does have a history of diabetes mellitus type 2, chronic kidney disease stage III, hypertension, paroxysmal atrial fibrillation, and long-term use of anticoagulant therapy due to the atrial fibrillation. He does not have any pain secondary to neuropathy. His most recent hemoglobin A1c was 6.5 roughly a year ago as best we can tell. He  does have [facility] coming out currently to help take care of his wound. Patient History Information obtained from Patient. Allergies No Known Allergies Social History Former smoker, Marital Status - Widowed, Alcohol Use - Never, Drug Use - No History, Caffeine Use - Daily. Medical History Eyes Denies history of Cataracts, Glaucoma, Optic Neuritis Ear/Nose/Mouth/Throat Denies history of Chronic sinus problems/congestion, Middle ear problems Hematologic/Lymphatic Denies history of Anemia, Hemophilia, Human Immunodeficiency Virus, Lymphedema, Sickle Cell Disease Respiratory Patient has history of Chronic Obstructive Pulmonary Disease (COPD) Denies history of Aspiration, Asthma, Pneumothorax, Sleep Apnea, Tuberculosis Cardiovascular Patient has history of Congestive Heart Failure, Hypertension, Myocardial Infarction Denies history of Angina, Arrhythmia, Coronary Artery Disease, Deep Vein Thrombosis, Hypotension, Peripheral Arterial Disease, Peripheral Venous Disease, Phlebitis, Vasculitis Gastrointestinal Denies history of Cirrhosis , Colitis, Crohn s, Hepatitis A, Hepatitis B, Hepatitis C Endocrine Patient has history of Type II Diabetes Denies history of Type I Diabetes Genitourinary Denies history of End Stage Renal Disease Immunological Denies history of Lupus Erythematosus, Raynaud s, Scleroderma Integumentary (Skin) Denies history of History of Burn, History of pressure wounds Musculoskeletal Denies history of Gout, Rheumatoid Arthritis, Osteoarthritis, Osteomyelitis Neurologic Denies history of Dementia, Neuropathy, Quadriplegia, Paraplegia, Seizure Disorder Oncologic Denies history of Received Chemotherapy, Received Radiation Psychiatric Denies history of Anorexia/bulimia, Confinement Anxiety Patient is treated with Oral Agents. Review of Systems (ROS) Constitutional Symptoms (General Health) Denies complaints or symptoms of Fatigue, Fever, Chills, Marked Weight  Change. Eyes Muraoka, Maclean L. ([id number]) Complains or has symptoms of Glasses / Contacts. Denies complaints or symptoms of Dry Eyes, Vision Changes. Ear/Nose/Mouth/Throat Denies complaints or symptoms of Difficult clearing ears, Sinusitis. Hematologic/Lymphatic Denies complaints or symptoms of Bleeding / Clotting Disorders, Human Immunodeficiency Virus. Respiratory Denies complaints or symptoms of Chronic or frequent coughs, Shortness of Breath. Cardiovascular Denies complaints or symptoms of Chest pain, LE edema. Gastrointestinal Denies complaints or symptoms of Frequent diarrhea, Nausea, Vomiting. Endocrine Denies complaints or symptoms of Hepatitis, Thyroid disease, Polydypsia (Excessive Thirst). Genitourinary Denies complaints or symptoms of Kidney failure/ Dialysis, Incontinence/dribbling. Immunological Denies complaints or symptoms of Hives, Itching. Integumentary (Skin) Complains or has symptoms of Wounds, Swelling. Denies complaints or symptoms of Bleeding or bruising tendency, Breakdown. Musculoskeletal Denies complaints or symptoms of Muscle Pain, Muscle Weakness. Neurologic Denies complaints or symptoms of Numbness/parasthesias, Focal/Weakness. Psychiatric Denies complaints or symptoms of Anxiety, Claustrophobia. Objective Constitutional patient is  hypertensive.. pulse regular and within target range for patient.Marland Kitchen respirations regular, non-labored and within target range for patient.Marland Kitchen temperature within target range for patient.. Well-nourished and well-hydrated in no acute distress. Vitals Time Taken: 10:58 AM, Height: 70 in, Source: Stated, Weight: 151 lbs, Source: Stated, BMI: 21.7, Temperature: 98.2 F, Pulse: 74 bpm, Respiratory Rate: 16 breaths/min, Blood Pressure: 185/79 mmHg. Eyes conjunctiva clear no eyelid edema noted. pupils equal round and reactive to light and accommodation. Ears, Nose, Mouth, and Throat no gross abnormality of ear auricles or  external auditory canals. normal hearing noted during conversation. mucus membranes moist. Respiratory normal breathing without difficulty. Musculoskeletal normal gait and posture. no significant deformity or arthritic changes, no loss or range of motion, no clubbing. Psychiatric this patient is able to make decisions and demonstrates good insight into disease process. Alert and Oriented x 3. pleasant and cooperative. General Notes: Upon inspection patient's wound bed actually showed signs of good granulation at this time. There does not appear to be any evidence of active infection which is great news and overall I am extremely pleased with how things stand. I believe that a collagen-based dressing would be beneficial for the patient at this point. Integumentary (Hair, Skin) Wound #1 status is Open. Original cause of wound was Gradually Appeared. The wound is located on the Left,Lateral Foot. The wound measures 2cm length x 2.1cm width x 0.2cm depth; 3.299cm^2 area and 0.66cm^3 volume. There is Fat Layer (Subcutaneous Tissue) exposed. There is no tunneling or undermining noted. There is a medium amount of serosanguineous drainage noted. There is large (67-100%) red granulation within the wound bed. There is a small (1-33%) amount of necrotic tissue within the wound bed including Adherent Slough. Assessment KHAMARI, SHEEHAN ([id number]) Active Problems ICD-10 Type 2 diabetes mellitus with foot ulcer Non-pressure chronic ulcer of other part of left foot with fat layer exposed Chronic kidney disease, stage 3 unspecified Essential (primary) hypertension Paroxysmal atrial fibrillation Long term (current) use of anticoagulants Plan Follow-up Appointments: Return Appointment in 2 weeks. Home Health: [facility] for wound care. May utilize formulary equivalent dressing for wound treatment orders unless otherwise specified. Home Health Nurse may visit PRN to address patient s wound  care needs. - [facility] Bathing/ Shower/ Hygiene: Clean wound with Normal Saline or wound cleanser. May shower with wound dressing protected with water repellent cover or cast protector. Edema Control - Lymphedema / Segmental Compressive Device / Other: Elevate, Exercise Daily and Avoid Standing for Long Periods of Time. Elevate legs to the level of the heart and pump ankles as often as possible Elevate leg(s) parallel to the floor when sitting. Off-Loading: Open toe surgical shoe WOUND #1: - Foot Wound Laterality: Left, Lateral Cleanser: Normal Saline (Generic) 3 x Per Week/30 Days Discharge Instructions: Wash your hands with soap and water. Remove old dressing, discard into plastic bag and place into trash. Cleanse the wound with Normal Saline prior to applying a clean dressing using gauze sponges, not tissues or cotton balls. Do not scrub or use excessive force. Pat dry using gauze sponges, not tissue or cotton balls. Primary Dressing: Prisma 4.34 (in) (Generic) 3 x Per Week/30 Days Discharge Instructions: Moisten w/normal saline or sterile water; Cover wound as directed. Do not remove from wound bed. Secondary Dressing: Bordered Gauze Sterile-HBD 4x4 (in/in) (Generic) 3 x Per Week/30 Days Discharge Instructions: Cover wound with Bordered Guaze Sterile as directed 1. Would recommend currently that we go ahead and initiate Prisma as a dressing of choice for the  patient's lateral foot wound I think this would be a good job. 2. Also can utilize a border foam dressing which I think will do great. 3. I am also going to suggest that the patient have a postop shoe he does not need a PEG assist as this is more to the side but I do not think I want him in a standard shoe either. We will see patient back for reevaluation in 1 week here in the clinic. If anything worsens or changes patient will contact our office for additional recommendations. Electronic Signature(s) Signed: 10/10/2020 4:41:25 PM  By: Lenda Kelp PA-C Entered By: Lenda Kelp on 10/10/2020 16:41:24 [name] [name] ([id number]) -------------------------------------------------------------------------------- ROS/PFSH Details Patient Name: [name] [name] Date of Service: 10/10/2020 10:30 AM Medical Record Number: [id number] Patient Account Number: [id number] Date of Birth/Sex: [birth year] (86 y.o. Male) Treating RN: Yevonne Pax Primary Care Provider: Gorden Harms Other Clinician: Referring Provider: Gorden Harms Treating Provider/Extender: Rowan Blase in Treatment: 0 Information Obtained From Patient Constitutional Symptoms (General Health) Complaints and Symptoms: Negative for: Fatigue; Fever; Chills; Marked Weight Change Eyes Complaints and Symptoms: Positive for: Glasses / Contacts Negative for: Dry Eyes; Vision Changes Medical History: Negative for: Cataracts; Glaucoma; Optic Neuritis Ear/Nose/Mouth/Throat Complaints and Symptoms: Negative for: Difficult clearing ears; Sinusitis Medical History: Negative for: Chronic sinus problems/congestion; Middle ear problems Hematologic/Lymphatic Complaints and Symptoms: Negative for: Bleeding / Clotting Disorders; Human Immunodeficiency Virus Medical History: Negative for: Anemia; Hemophilia; Human Immunodeficiency Virus; Lymphedema; Sickle Cell Disease Respiratory Complaints and Symptoms: Negative for: Chronic or frequent coughs; Shortness of Breath Medical History: Positive for: Chronic Obstructive Pulmonary Disease (COPD) Negative for: Aspiration; Asthma; Pneumothorax; Sleep Apnea; Tuberculosis Cardiovascular Complaints and Symptoms: Negative for: Chest pain; LE edema Medical History: Positive for: Congestive Heart Failure; Hypertension; Myocardial Infarction Negative for: Angina; Arrhythmia; Coronary Artery Disease; Deep Vein Thrombosis; Hypotension; Peripheral Arterial Disease; Peripheral Venous Disease; Phlebitis;  Vasculitis Gastrointestinal Complaints and Symptoms: Negative for: Frequent diarrhea; Nausea; Vomiting Medical History: Negative for: Cirrhosis ; Colitis; Crohnos; Hepatitis A; Hepatitis B; Hepatitis C Endocrine Geng, Cordaryl L. ([id number]) Complaints and Symptoms: Negative for: Hepatitis; Thyroid disease; Polydypsia (Excessive Thirst) Medical History: Positive for: Type II Diabetes Negative for: Type I Diabetes Time with diabetes: 20 years Treated with: Oral agents Genitourinary Complaints and Symptoms: Negative for: Kidney failure/ Dialysis; Incontinence/dribbling Medical History: Negative for: End Stage Renal Disease Immunological Complaints and Symptoms: Negative for: Hives; Itching Medical History: Negative for: Lupus Erythematosus; Raynaudos; Scleroderma Integumentary (Skin) Complaints and Symptoms: Positive for: Wounds; Swelling Negative for: Bleeding or bruising tendency; Breakdown Medical History: Negative for: History of Burn; History of pressure wounds Musculoskeletal Complaints and Symptoms: Negative for: Muscle Pain; Muscle Weakness Medical History: Negative for: Gout; Rheumatoid Arthritis; Osteoarthritis; Osteomyelitis Neurologic Complaints and Symptoms: Negative for: Numbness/parasthesias; Focal/Weakness Medical History: Negative for: Dementia; Neuropathy; Quadriplegia; Paraplegia; Seizure Disorder Psychiatric Complaints and Symptoms: Negative for: Anxiety; Claustrophobia Medical History: Negative for: Anorexia/bulimia; Confinement Anxiety Oncologic Medical History: Negative for: Received Chemotherapy; Received Radiation Immunizations Pneumococcal Vaccine: Received Pneumococcal Vaccination: No Implantable Devices None THANH, MOTTERN ([id number]) Family and Social History Former smoker; Marital Status - Widowed; Alcohol Use: Never; Drug Use: No History; Caffeine Use: Daily; Financial Concerns: No; Food, Clothing or Shelter Needs: No;  Support System Lacking: No; Transportation Concerns: No Electronic Signature(s) Signed: 10/10/2020 4:54:01 PM By: Lenda Kelp PA-C Signed: 10/13/2020 7:56:30 AM By: Yevonne Pax RN Entered By: Yevonne Pax on 10/10/2020 11:21:31 [name] [name] ([id number]) -------------------------------------------------------------------------------- SuperBill Details Patient Name: [name] [name].  Date of Service: 10/10/2020 Medical Record Number: [id number] Patient Account Number: [id number] Date of Birth/Sex: 1935/06/26 (86 y.o. Male) Treating RN: Yevonne Pax Primary Care Provider: Gorden Harms Other Clinician: Referring Provider: Gorden Harms Treating Provider/Extender: Rowan Blase in Treatment: 0 Diagnosis Coding ICD-10 Codes Code Description E11.621 Type 2 diabetes mellitus with foot ulcer L97.522 Non-pressure chronic ulcer of other part of left foot with fat layer exposed N18.30 Chronic kidney disease, stage 3 unspecified I10 Essential (primary) hypertension I48.0 Paroxysmal atrial fibrillation Z79.01 Long term (current) use of anticoagulants Facility Procedures CPT4 Code: [id number] Description: 99214 - WOUND CARE VISIT-LEV 4 EST PT Modifier: Quantity: 1 Physician Procedures CPT4 Code: [id number] Description: WC PHYS LEVEL 3 o NEW PT Modifier: Quantity: 1 CPT4 Code: Description: ICD-10 Diagnosis Description E11.621 Type 2 diabetes mellitus with foot ulcer L97.522 Non-pressure chronic ulcer of other part of left foot with fat layer N18.30 Chronic kidney disease, stage 3 unspecified I10 Essential (primary)  hypertension Modifier: exposed Quantity: Electronic Signature(s) Signed: 10/10/2020 4:41:52 PM By: Lenda Kelp PA-C Entered By: Lenda Kelp on 10/10/2020 16:41:51

## 2020-10-20 ENCOUNTER — Encounter (HOSPITAL_COMMUNITY): Payer: Medicare Other | Admitting: Internal Medicine

## 2020-10-24 ENCOUNTER — Encounter: Payer: Medicare Other | Attending: Physician Assistant | Admitting: Physician Assistant

## 2020-10-24 ENCOUNTER — Other Ambulatory Visit: Payer: Self-pay

## 2020-10-24 DIAGNOSIS — N183 Chronic kidney disease, stage 3 unspecified: Secondary | ICD-10-CM | POA: Insufficient documentation

## 2020-10-24 DIAGNOSIS — E1151 Type 2 diabetes mellitus with diabetic peripheral angiopathy without gangrene: Secondary | ICD-10-CM | POA: Diagnosis not present

## 2020-10-24 DIAGNOSIS — E1122 Type 2 diabetes mellitus with diabetic chronic kidney disease: Secondary | ICD-10-CM | POA: Insufficient documentation

## 2020-10-24 DIAGNOSIS — Z7901 Long term (current) use of anticoagulants: Secondary | ICD-10-CM | POA: Diagnosis not present

## 2020-10-24 DIAGNOSIS — L97522 Non-pressure chronic ulcer of other part of left foot with fat layer exposed: Secondary | ICD-10-CM | POA: Insufficient documentation

## 2020-10-24 DIAGNOSIS — E1140 Type 2 diabetes mellitus with diabetic neuropathy, unspecified: Secondary | ICD-10-CM | POA: Insufficient documentation

## 2020-10-24 DIAGNOSIS — I509 Heart failure, unspecified: Secondary | ICD-10-CM | POA: Insufficient documentation

## 2020-10-24 DIAGNOSIS — I130 Hypertensive heart and chronic kidney disease with heart failure and stage 1 through stage 4 chronic kidney disease, or unspecified chronic kidney disease: Secondary | ICD-10-CM | POA: Insufficient documentation

## 2020-10-24 DIAGNOSIS — E11621 Type 2 diabetes mellitus with foot ulcer: Secondary | ICD-10-CM | POA: Diagnosis not present

## 2020-10-24 NOTE — Progress Notes (Addendum)
[name] [name] ([id number]) Visit Report for 10/24/2020 Arrival Information Details Patient Name: [name] [name], [name] [name]. Date of Service: 10/24/2020 2:30 PM Medical Record Number: [id number] Patient Account Number: [id number] Date of Birth/Sex: September 26, 1934 (86 y.o. M) Treating RN: Huel Coventry Primary Care Kierston Plasencia: Gorden Harms Other Clinician: Referring Saatvik Thielman: Gorden Harms Treating Cabrini Ruggieri/Extender: Rowan Blase in Treatment: 2 Visit Information History Since Last Visit Pain Present Now: No Patient Arrived: [name] [name] Arrival Time: 14:53 Transfer Assistance: Manual Patient Requires Transmission-Based No Precautions: Patient Has Alerts: Yes Patient Alerts: Patient on Blood Thinner NON COMPRESSABLE Electronic Signature(s) Signed: 10/24/2020 5:45:00 PM By: Elliot Gurney, BSN, RN, CWS, Kim RN, BSN Entered By: Elliot Gurney, BSN, RN, CWS, Kim on 10/24/2020 14:56:33 [name] [name] ([id number]) -------------------------------------------------------------------------------- Clinic Level of Care Assessment Details Patient Name: [name] [name]. Date of Service: 10/24/2020 2:30 PM Medical Record Number: [id number] Patient Account Number: [id number] Date of Birth/Sex: 10-20-1934 (86 y.o. M) Treating RN: Yevonne Pax Primary Care Shalin Linders: Gorden Harms Other Clinician: Referring Kaytlen Lightsey: Gorden Harms Treating Daelynn Blower/Extender: Rowan Blase in Treatment: 2 Clinic Level of Care Assessment Items TOOL 4 Quantity Score X - Use when only an EandM is performed on FOLLOW-UP visit 1 0 ASSESSMENTS - Nursing Assessment / Reassessment X - Reassessment of Co-morbidities (includes updates in patient status) 1 10 X- 1 5 Reassessment of Adherence to Treatment Plan ASSESSMENTS - Wound and Skin Assessment / Reassessment X - Simple Wound Assessment / Reassessment - one wound 1 5 []  - 0 Complex Wound Assessment / Reassessment - multiple wounds []  - 0 Dermatologic / Skin Assessment (not related  to wound area) ASSESSMENTS - Focused Assessment []  - Circumferential Edema Measurements - multi extremities 0 []  - 0 Nutritional Assessment / Counseling / Intervention []  - 0 Lower Extremity Assessment (monofilament, tuning fork, pulses) []  - 0 Peripheral Arterial Disease Assessment (using hand held doppler) ASSESSMENTS - Ostomy and/or Continence Assessment and Care []  - Incontinence Assessment and Management 0 []  - 0 Ostomy Care Assessment and Management (repouching, etc.) PROCESS - Coordination of Care X - Simple Patient / Family Education for ongoing care 1 15 []  - 0 Complex (extensive) Patient / Family Education for ongoing care X- 1 10 Staff obtains , Records, Test Results / Process Orders []  - 0 Staff telephones HHA, Nursing Homes / Clarify orders / etc []  - 0 Routine Transfer to another Facility (non-emergent condition) []  - 0 Routine Hospital Admission (non-emergent condition) []  - 0 New Admissions / / Ordering NPWT, Apligraf, etc. []  - 0 Emergency Hospital Admission (emergent condition) X- 1 10 Simple Discharge Coordination []  - 0 Complex (extensive) Discharge Coordination PROCESS - Special Needs []  - Pediatric / Minor Patient Management 0 []  - 0 Isolation Patient Management []  - 0 Hearing / Language / Visual special needs []  - 0 Assessment of Community assistance (transportation, D/C planning, etc.) []  - 0 Additional assistance / Altered mentation []  - 0 Support Surface(s) Assessment (bed, cushion, seat, etc.) INTERVENTIONS - Wound Cleansing / Measurement [name], [name] ( ) X- 1 5 Simple Wound Cleansing - one wound []  - 0 Complex Wound Cleansing - multiple wounds X- 1 5 Wound Imaging (photographs - any number of wounds) []  - 0 Wound Tracing (instead of photographs) X- 1 5 Simple Wound Measurement - one wound []  - 0 Complex Wound Measurement - multiple wounds INTERVENTIONS - Wound Dressings X - Small  Wound Dressing one or multiple wounds 1 10 []  - 0 Medium Wound Dressing one or multiple wounds []  - 0  Large Wound Dressing one or multiple wounds []  - 0 Application of Medications - topical []  - 0 Application of Medications - injection INTERVENTIONS - Miscellaneous []  - External ear exam 0 []  - 0 Specimen Collection (cultures, biopsies, blood, body fluids, etc.) []  - 0 Specimen(s) / Culture(s) sent or taken to Lab for analysis []  - 0 Patient Transfer (multiple staff / Nurse, adult / Similar devices) []  - 0 Simple Staple / Suture removal (25 or less) []  - 0 Complex Staple / Suture removal (26 or more) []  - 0 Hypo / Hyperglycemic Management (close monitor of Blood Glucose) []  - 0 Ankle / Brachial Index (ABI) - do not check if billed separately X- 1 5 Vital Signs Has the patient been seen at the hospital within the last three years: Yes Total Score: 85 Level Of Care: New/Established - Level 3 Electronic Signature(s) Signed: 10/27/2020 4:08:54 PM By: Yevonne Pax RN Entered By: Yevonne Pax on 10/24/2020 15:19:52 [name] [name] ([id number]) -------------------------------------------------------------------------------- Lower Extremity Assessment Details Patient Name: [name] [name]. Date of Service: 10/24/2020 2:30 PM Medical Record Number: [id number] Patient Account Number: [id number] Date of Birth/Sex: 1935/02/02 (86 y.o. M) Treating RN: Huel Coventry Primary Care Dearra Myhand: Gorden Harms Other Clinician: Referring Sloka Volante: Gorden Harms Treating Kodee Drury/Extender: Allen Derry Weeks in Treatment: 2 Edema Assessment Assessed: [Left: Yes] [Right: No] [Left: Edema] [Right: :] Calf Left: Right: Point of Measurement: 42 cm From Medial Instep 29.5 cm Ankle Left: Right: Point of Measurement: 10 cm From Medial Instep 21.5 cm Vascular Assessment Pulses: Dorsalis Pedis Palpable: [Left:Yes] Electronic Signature(s) Signed: 10/24/2020 5:45:00 PM By: Elliot Gurney, BSN, RN, CWS,  Kim RN, BSN Entered By: Elliot Gurney, BSN, RN, CWS, Kim on 10/24/2020 15:06:38 [name] [name] ([id number]) -------------------------------------------------------------------------------- Multi Wound Chart Details Patient Name: [name] [name]. Date of Service: 10/24/2020 2:30 PM Medical Record Number: [id number] Patient Account Number: [id number] Date of Birth/Sex: 1935-07-26 (86 y.o. M) Treating RN: Yevonne Pax Primary Care Aarthi Uyeno: Gorden Harms Other Clinician: Referring Ester Mabe: Gorden Harms Treating Colisha Redler/Extender: Rowan Blase in Treatment: 2 Vital Signs Height(in): 70 Pulse(bpm): 76 Weight(lbs): 151 Blood Pressure(mmHg): Body Mass Index(BMI): 22 Temperature(F): 98.9 Respiratory Rate(breaths/min): 18 Photos: [N/A:N/A] Wound Location: Left, Lateral Foot N/A N/A Wounding Event: Gradually Appeared N/A N/A Primary Etiology: Diabetic Wound/Ulcer of the Lower N/A N/A Extremity Comorbid History: Chronic Obstructive Pulmonary N/A N/A Disease (COPD), Congestive Heart Failure, Hypertension, Myocardial Infarction, Type II Diabetes Date Acquired: 09/14/2019 N/A N/A Weeks of Treatment: 2 N/A N/A Wound Status: Open N/A N/A Measurements L x W x D (cm) 0.1x1x0.2 N/A N/A Area (cm) : 0.079 N/A N/A Volume (cm) : 0.016 N/A N/A % Reduction in Area: 97.60% N/A N/A % Reduction in Volume: 97.60% N/A N/A Classification: Grade 2 N/A N/A Exudate Amount: Medium N/A N/A Exudate Type: Serosanguineous N/A N/A Exudate Color: red, brown N/A N/A Granulation Amount: Large (67-100%) N/A N/A Granulation Quality: Pink N/A N/A Necrotic Amount: Small (1-33%) N/A N/A Exposed Structures: Fat Layer (Subcutaneous Tissue): N/A N/A Yes Fascia: No Tendon: No Muscle: No Joint: No Bone: No Epithelialization: None N/A N/A Treatment Notes Electronic Signature(s) Signed: 10/27/2020 4:08:54 PM By: Yevonne Pax RN Entered By: Yevonne Pax on 10/24/2020 15:17:07 [name] [name]  ([id number]) -------------------------------------------------------------------------------- Multi-Disciplinary Care Plan Details Patient Name: [name] [name]. Date of Service: 10/24/2020 2:30 PM Medical Record Number: [id number] Patient Account Number: [id number] Date of Birth/Sex: December 21, 1934 (86 y.o. M) Treating RN: Yevonne Pax Primary Care Ameenah Prosser: Gorden Harms Other Clinician: Referring Dagoberto Nealy: Gorden Harms Treating Tanmay Halteman/Extender: Rowan Blase in  Treatment: 2 Active Inactive Wound/Skin Impairment Nursing Diagnoses: Knowledge deficit related to ulceration/compromised skin integrity Goals: Patient/caregiver will verbalize understanding of skin care regimen Date Initiated: 10/10/2020 Target Resolution Date: 11/10/2020 Goal Status: Active Ulcer/skin breakdown will have a volume reduction of 30% by week 4 Date Initiated: 10/10/2020 Target Resolution Date: 11/10/2020 Goal Status: Active Ulcer/skin breakdown will have a volume reduction of 50% by week 8 Date Initiated: 10/10/2020 Target Resolution Date: 12/08/2020 Goal Status: Active Ulcer/skin breakdown will have a volume reduction of 80% by week 12 Date Initiated: 10/10/2020 Target Resolution Date: 01/08/2021 Goal Status: Active Ulcer/skin breakdown will heal within 14 weeks Date Initiated: 10/10/2020 Target Resolution Date: 02/07/2021 Goal Status: Active Interventions: Assess patient/caregiver ability to obtain necessary supplies Assess patient/caregiver ability to perform ulcer/skin care regimen upon admission and as needed Assess ulceration(s) every visit Notes: Electronic Signature(s) Signed: 10/27/2020 4:08:54 PM By: Yevonne Pax RN Entered By: Yevonne Pax on 10/24/2020 15:16:58 Iwata, Pryor [name] ([id number]) -------------------------------------------------------------------------------- Pain Assessment Details Patient Name: [name] [name]. Date of Service: 10/24/2020 2:30 PM Medical Record Number:  [id number] Patient Account Number: [id number] Date of Birth/Sex: 1935-03-22 (86 y.o. M) Treating RN: Huel Coventry Primary Care Iridessa Harrow: Gorden Harms Other Clinician: Referring Michaelle Bottomley: Gorden Harms Treating Theophil Thivierge/Extender: Rowan Blase in Treatment: 2 Active Problems Location of Pain Severity and Description of Pain Patient Has Paino No Site Locations Pain Management and Medication Current Pain Management: Notes Patient denies pain at this time. Electronic Signature(s) Signed: 10/24/2020 5:45:00 PM By: Elliot Gurney, BSN, RN, CWS, Kim RN, BSN Entered By: Elliot Gurney, BSN, RN, CWS, Kim on 10/24/2020 14:57:52 [name] [name] ([id number]) -------------------------------------------------------------------------------- Patient/Caregiver Education Details Patient Name: [name] [name], [name] [name] Date of Service: 10/24/2020 2:30 PM Medical Record Number: [id number] Patient Account Number: [id number] Date of Birth/Gender: 1935-09-07 (86 y.o. M) Treating RN: Yevonne Pax Primary Care Physician: Gorden Harms Other Clinician: Referring Physician: Gorden Harms Treating Physician/Extender: Rowan Blase in Treatment: 2 Education Assessment Education Provided To: Patient Education Topics Provided Wound/Skin Impairment: Methods: Explain/Verbal Responses: State content correctly Electronic Signature(s) Signed: 10/27/2020 4:08:54 PM By: Yevonne Pax RN Entered By: Yevonne Pax on 10/24/2020 15:20:08 [name] [name] ([id number]) -------------------------------------------------------------------------------- Wound Assessment Details Patient Name: [name] Mcalpine L. Date of Service: 10/24/2020 2:30 PM Medical Record Number: [id number] Patient Account Number: [id number] Date of Birth/Sex: 29-May-1935 (86 y.o. M) Treating RN: Huel Coventry Primary Care Gracynn Rajewski: Gorden Harms Other Clinician: Referring Letrell Attwood: Gorden Harms Treating Labella Zahradnik/Extender: Allen Derry Weeks in Treatment:  2 Wound Status Wound Number: 1 Primary Diabetic Wound/Ulcer of the Lower Extremity Etiology: Wound Location: Left, Lateral Foot Wound Open Wounding Event: Gradually Appeared Status: Date Acquired: 09/14/2019 Comorbid Chronic Obstructive Pulmonary Disease (COPD), Weeks Of Treatment: 2 History: Congestive Heart Failure, Hypertension, Myocardial Clustered Wound: No Infarction, Type II Diabetes Photos Wound Measurements Length: (cm) 0.1 Width: (cm) 1 Depth: (cm) 0.2 Area: (cm) 0.079 Volume: (cm) 0.016 % Reduction in Area: 97.6% % Reduction in Volume: 97.6% Epithelialization: None Tunneling: No Undermining: No Wound Description Classification: Grade 2 Exudate Amount: Medium Exudate Type: Serosanguineous Exudate Color: red, brown Foul Odor After Cleansing: No Slough/Fibrino Yes Wound Bed Granulation Amount: Large (67-100%) Exposed Structure Granulation Quality: Pink Fascia Exposed: No Necrotic Amount: Small (1-33%) Fat Layer (Subcutaneous Tissue) Exposed: Yes Necrotic Quality: Adherent Slough Tendon Exposed: No Muscle Exposed: No Joint Exposed: No Bone Exposed: No Electronic Signature(s) Signed: 10/24/2020 5:45:00 PM By: Elliot Gurney, BSN, RN, CWS, Kim RN, BSN Entered By: Elliot Gurney, BSN, RN, CWS, Kim on 10/24/2020 15:04:03 [name] [name] ([id number]) -------------------------------------------------------------------------------- Vitals Details Patient  Name: [name] [name], [name] [name]. Date of Service: 10/24/2020 2:30 PM Medical Record Number: [id number] Patient Account Number: [id number] Date of Birth/Sex: [birth year] (86 y.o. M) Treating RN: Huel Coventry Primary Care Shuaib Corsino: Gorden Harms Other Clinician: Referring George Haggart: Gorden Harms Treating Cornel Werber/Extender: Rowan Blase in Treatment: 2 Vital Signs Time Taken: 14:56 Temperature (F): 98.9 Height (in): 70 Pulse (bpm): 76 Weight (lbs): 151 Respiratory Rate (breaths/min): 18 Body Mass Index (BMI): 21.7 Blood  Pressure (mmHg): 222/89 Reference Range: 80 - 120 mg / dl Notes notified Allen Derry of blood pressure Electronic Signature(s) Signed: 10/27/2020 4:08:54 PM By: Yevonne Pax RN Entered By: Yevonne Pax on 10/24/2020 15:22:23

## 2020-10-24 NOTE — Progress Notes (Signed)
Advanced HF Clinic Note    PCP: Lana Fish, PA-C Primary HF Cardiologist: Dr. [name]  HPI: Mr [name] [name] is an 86 year old with history of DM2, HTN. HL, CAD, chronic AF and CKD 3 (1.7) .   Went to [facility] ER on 08/28/19 and found to be AFL/AF with RVR with rates in 190s. Reportedly was hypothermic with temp of 88 degrees and smelled like kerosene.Given 25mg  of IV diltiazem in ER and became markedly hypotensive. Underwent attempted DC-CV without effect. Felt to have code sepsis as well. Started on NE and vanc/cefipime. Lactate 7.8. Transferred to [facility]. ECHO was completed and showed EF 25-30%. Had RHC/LHC with nonobstructive CAD and well compensated hemodynamics. EP consulted for VT and frequent PVCs. Loaded on amio drip and due to ongoing PVCs mexiletene was added. Discharged with Life Vest.   Seen in HF Clinic in January. Mildly overloaded but doing ok.  Readmitted 1/22-25/2021 with symptomatic hypoglycemia (38). Given d50 and D10 gtt. Glipizide stopped.   He returned 3/21 for HF follow up. Weak. Can do ADLs but legs get tired quickly. Wearing LifeVest. Entresto decreased to 24/26 & echo ordered.  Today he returns for HF follow up. He has been off of all of his medications for a couple weeks due to running out. No HA/vision changes. Does not check BP at home. Overall feeling fine. Denies increasing SOB, CP, dizziness, edema, or PND/Orthopnea. Appetite better. No fever or chills. Weight at home ~153 pounds. No longer wearing LifeVest. Daughter helps with pillbox at home  Cardiac Studies: Echo 08/29/19: EF 25-30% with global HK. LVOT VTI 10 cm which equates to CO/CI of 2.0 L/min and 1.0 L/min/m2.   RHC/LHC 09/03/19   Ost LAD to Prox LAD lesion is 40% stenosed.  2nd Diag lesion is 60% stenosed.  Dist Cx lesion is 70% stenosed.  1st Mrg lesion is 30% stenosed.  Ost RCA to Prox RCA lesion is 30% stenosed.  RPAV lesion is 40% stenosed.  3rd RPL lesion is 60%  stenosed. Findings: Ao = 138/47 (72) LV = 136/2 RA = 4 RV = 29/1 PA = 33/4 (11) PCW = 10 (v = 25) Fick cardiac output/index = 5.7/3.0 PVR = < 1.0 WU Ao sat = 99% PA sat = 66%, 67% Assessment: 1. Non-obstructive CAD 2. Severe NICM with EF 25% by echo 3. VT and frequent PVCs 4. Well-compensated hemodynamics  ROS: All systems negative except as listed in HPI, PMH and Problem List.  SH:  Social History   Socioeconomic History  . Marital status: Single    Spouse name: Not on file  . Number of children: Not on file  . Years of education: Not on file  . Highest education level: Not on file  Occupational History  . Not on file  Tobacco Use  . Smoking status: Former Smoker    Packs/day: 0.25    Years: 50.00    Pack years: 12.50    Types: Cigarettes  . Smokeless tobacco: Current User    Types: Chew  Substance and Sexual Activity  . Alcohol use: No  . Drug use: No  . Sexual activity: Not on file  Other Topics Concern  . Not on file  Social History Narrative  . Not on file   Social Determinants of Health   Financial Resource Strain: Not on file  Food Insecurity: Not on file  Transportation Needs: Not on file  Physical Activity: Not on file  Stress: Not on file  Social Connections: Not on  file  Intimate Partner Violence: Not on file   FH:  Family History  Problem Relation Age of Onset  . Stroke Mother     Past Medical History:  Diagnosis Date  . Arthritis    shoulder  . Asthma   . Atrial fibrillation (HCC)   . Chronic kidney disease    stage III  . COPD (chronic obstructive pulmonary disease) (HCC)   . Coronary artery disease   . Diabetes mellitus without complication (HCC)    type 2  . Dysrhythmia    PER [facility] HEART IRREGULARLY IRREGULAR  . Full dentures    upper and lower  . Hypercholesteremia   . Hypertension   . Myocardial infarction (HCC)   . Shortness of breath dyspnea   . Swelling    FEET AND LEGS    Current  Outpatient Medications  Medication Sig Dispense Refill  . sacubitril-valsartan (ENTRESTO) 49-51 MG Take 1 tablet by mouth 2 (two) times daily. 60 tablet 6  . amiodarone (PACERONE) 200 MG tablet Take 1 tablet (200 mg total) by mouth daily. 30 tablet 3  . apixaban (ELIQUIS) 2.5 MG TABS tablet Take 1 tablet (2.5 mg total) by mouth 2 (two) times daily. 60 tablet 6  . atorvastatin (LIPITOR) 40 MG tablet Take 1 tablet (40 mg total) by mouth daily at 6 PM. 30 tablet 3  . isosorbide-hydrALAZINE (BIDIL) 20-37.5 MG tablet Take 2 tablets by mouth 3 (three) times daily. Needs appt for further refills 540 tablet 3  . magnesium oxide (MAG-OX) 400 MG tablet Take 0.5 tablets (200 mg total) by mouth daily. 45 tablet 3  . mexiletine (MEXITIL) 150 MG capsule Take 2 capsules (300 mg total) by mouth every 12 (twelve) hours. 60 capsule 6   No current facility-administered medications for this encounter.    Vitals:   10/27/20 1143  BP: (!) 216/90  Pulse: 73  SpO2: 98%  Weight: 69.2 kg (152 lb 9.6 oz)   Vitals:   10/27/20 1143  BP: (!) 216/90  Pulse: 73  SpO2: 98%   Wt Readings from Last 3 Encounters:  10/27/20 69.2 kg (152 lb 9.6 oz)  03/18/20 61.7 kg (136 lb 0.4 oz)  11/14/19 61.7 kg (136 lb)    PHYSICAL EXAM: General:  NAD. Cachetic, elderly, No resp difficulty. HEENT: Normal Neck: Supple. No JVD. Carotids 2+ bilat; no bruits. No lymphadenopathy or thryomegaly appreciated. Cor: PMI nondisplaced. Regular rate & rhythm. No rubs, gallops or murmurs. Lungs: Clear Abdomen: Soft, nontender, nondistended. No hepatosplenomegaly. No bruits or masses. Good bowel sounds. Extremities: No cyanosis, clubbing, rash, edema Neuro: Alert & oriented x 3, cranial nerves grossly intact. Moves all 4 extremities w/o difficulty. Affect pleasant.  ECG: SR w/ PACs, 79 bpm (personally reviewed).  ASSESSMENT & PLAN: 1. 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 - NYHA III-IIIb likely due to combination of HF and general deconditioning, volume status ok. - Consider adding beta blocker next. - Continue spiro 25 mg daily.  - Increase Entresto to 49/51 mg bid. - Increase Bidil to 2 tab tid. - Check CMET, BNP and TFTs  - See back in 1-2 months with echo. Doubt he will be ICD candidate.   2. Hypertensive Urgency, asymptomatic - Suspect in the setting of running out of meds last week. - SBP >200s, asymptomatic. - Give hydralazine 75 mg po x1 now. -  Increase BiDil to 2 tablets tid, starting today. - BP cuff Rx given, check daily and log. - Follow up next week for BP check (OK for daughter to call with readings as patient lives >1 hour away).  3. VT  - Admitted for VT in 08/2019. Wore LifeVest with no reoccurrence. - Continue amio and mexiletine per EP.  - No longer wearing LifeVest. Doubt he will be ICD candidate.  - Continue f/u with EP.  4. CAD - LHC 09/03/19 Nonobstructive CAD. - No s/s ischemia. -Continue ASA. - Continue atorvastatin 40 mg daily.  5. CKD3 - Baseline creatinine 1.7  - Labs today.  6. DM2 - Off all DM meds due to symptomatic hypoglycemia.  - a1c (1/21) 6.5  7. Cachexia - Improved, weight is up 16 lbs in 11 months.  - Appetite improved.  BP check next week, follow back in APP clinic in 4 weeks.   Follow up in 1-2 months with Echo with Dr. [name].  Anderson Malta [facility] FNP-BC 10/27/20 3:51 PM

## 2020-10-24 NOTE — Progress Notes (Addendum)
[name] [name], Romond L. ([id number]) Visit Report for 10/24/2020 Chief Complaint Document Details Patient Name: [name] [name], [name] L. Date of Service: 10/24/2020 2:30 PM Medical Record Number: [id number] Patient Account Number: [id number] Date of Birth/Sex: 07/24/1935 (86 y.o. M) Treating RN: Yevonne PaxEpps, Carrie Primary Care Provider: Gorden HarmsMcManus, Brenna Other Clinician: Referring Provider: Gorden HarmsMcManus, Brenna Treating Provider/Extender: Rowan BlaseStone, Dura Mccormack Weeks in Treatment: 2 Information Obtained from: Patient Chief Complaint Left foot ulcer Electronic Signature(s) Signed: 10/24/2020 2:40:35 PM By: Lenda KelpStone III, Geralene Afshar PA-C Entered By: Lenda KelpStone III, Gamal Todisco on 10/24/2020 14:40:35 Fodor, [name] OchoaWALTER L. ([id number]) -------------------------------------------------------------------------------- HPI Details Patient Name: [name] [name], [name] L. Date of Service: 10/24/2020 2:30 PM Medical Record Number: [id number] Patient Account Number: [id number] Date of Birth/Sex: 01/26/1935 (86 y.o. M) Treating RN: Yevonne PaxEpps, Carrie Primary Care Provider: Gorden HarmsMcManus, Brenna Other Clinician: Referring Provider: Gorden HarmsMcManus, Brenna Treating Provider/Extender: Rowan BlaseStone, Delance Weide Weeks in Treatment: 2 History of Present Illness HPI Description: 10/10/2020 upon evaluation today patient presents for initial evaluation in our clinic concerning a wound he has on the left lateral foot. 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 With that being said currently he has a wound portion of his left foot that is what we are seeing him for today everything else appears to actually be doing quite well which is great news. There does not appear to be any signs of active infection at this time which is also great news. He does have a history of diabetes mellitus type 2, chronic kidney disease stage III, hypertension, paroxysmal  atrial fibrillation, and long-term use of anticoagulant therapy due to the atrial fibrillation. He does not have any pain secondary to neuropathy. His most recent hemoglobin A1c was 6.5 roughly a year ago as best we can tell. He does have [facility] coming out currently to help take care of his wound. 10/24/2020 upon evaluation today patient appears to be doing well with regard to his wound. In fact I am very pleased with how things appear to be doing at this time. That is the least of his concerns at this time. Overall I am extremely pleased with the progress has been made. With that being said my biggest concern at this point he has actually not to do with his wound and other his blood pressure which is significantly elevated today. I am not certain that he has been taking his blood pressure upon further questioning with the patient and his family in fact I am almost certain that he is not. We did attempt to call his primary care provider but unfortunately we were unable to get them on the phone. The phone just rang. Nonetheless I think that he has an appointment with the cardiologist on Monday they can obviously recheck his blood pressure then. He also needs to get an appointment with his primary care provider likely to discuss his blood pressures. But also if he is not taking the medicine taking the medicine could obviously make a big difference in his blood pressure as it stands as well. Electronic Signature(s) Signed: 10/24/2020 3:47:43 PM By: Lenda KelpStone III, Keelee Yankey PA-C Entered By: Lenda KelpStone III, Earlyne Feeser on 10/24/2020 15:47:43 [place], [name] OchoaWALTER L. ([id number]) -------------------------------------------------------------------------------- Physical Exam Details Patient Name: [name] [name], [name] L. Date of Service: 10/24/2020 2:30 PM Medical Record Number: [id number] Patient Account Number: [id number] Date of Birth/Sex: 07/17/1935 (86 y.o. M) Treating RN: Yevonne PaxEpps, Carrie Primary Care Provider: Clarene DukeMcManus,  Jodene Nam Other  Clinician: Referring Provider: Gorden Harms Treating Provider/Extender: Rowan Blase in Treatment: 2 Constitutional Well-nourished and well-hydrated in no acute distress. Respiratory normal breathing without difficulty. Psychiatric this patient is able to make decisions and demonstrates good insight into disease process. Alert and Oriented x 3. pleasant and cooperative. Notes Upon inspection patient's wound bed actually showed signs of good granulation epithelization no significant slough buildup that required sharp debridement I did mechanically debride which is saline and gauze the wound looked well post debridement. There is no signs of infection at this point. The patient has no headache, blurred vision, dizziness, nausea, or chest discomfort. Electronic Signature(s) Signed: 10/24/2020 3:48:09 PM By: Lenda Kelp PA-C Entered By: Lenda Kelp on 10/24/2020 15:48:09 [name] [name] ([id number]) -------------------------------------------------------------------------------- Physician Orders Details Patient Name: [name] [name], SABER. Date of Service: 10/24/2020 2:30 PM Medical Record Number: [id number] Patient Account Number: [id number] Date of Birth/Sex: 1935/09/02 (86 y.o. M) Treating RN: Yevonne Pax Primary Care Provider: Gorden Harms Other Clinician: Referring Provider: Gorden Harms Treating Provider/Extender: Rowan Blase in Treatment: 2 Verbal / Phone Orders: No Diagnosis Coding ICD-10 Coding Code Description E11.621 Type 2 diabetes mellitus with foot ulcer L97.522 Non-pressure chronic ulcer of other part of left foot with fat layer exposed N18.30 Chronic kidney disease, stage 3 unspecified I10 Essential (primary) hypertension I48.0 Paroxysmal atrial fibrillation Z79.01 Long term (current) use of anticoagulants Follow-up Appointments o Return Appointment in 2 weeks. Home Health o [facility] for wound care. May utilize formulary  equivalent dressing for wound treatment orders unless otherwise specified. Home Health Nurse may visit PRN to address patientos wound care needs. - [facility] Bathing/ Shower/ Hygiene o Clean wound with Normal Saline or wound cleanser. o May shower with wound dressing protected with water repellent cover or cast protector. Edema Control - Lymphedema / Segmental Compressive Device / Other o Elevate, Exercise Daily and Avoid Standing for Long Periods of Time. o Elevate legs to the level of the heart and pump ankles as often as possible o Elevate leg(s) parallel to the floor when sitting. Off-Loading Bilateral Lower Extremities o Open toe surgical shoe Wound Treatment Wound #1 - Foot Wound Laterality: Left, Lateral Cleanser: Normal Saline (Generic) 3 x Per Week/30 Days Discharge Instructions: Wash your hands with soap and water. Remove old dressing, discard into plastic bag and place into trash. Cleanse the wound with Normal Saline prior to applying a clean dressing using gauze sponges, not tissues or cotton balls. Do not scrub or use excessive force. Pat dry using gauze sponges, not tissue or cotton balls. Primary Dressing: Prisma 4.34 (in) (Generic) 3 x Per Week/30 Days Discharge Instructions: Moisten w/normal saline or sterile water; Cover wound as directed. Do not remove from wound bed. Secondary Dressing: Bordered Gauze Sterile-HBD 4x4 (in/in) (Generic) 3 x Per Week/30 Days Discharge Instructions: Cover wound with Bordered Guaze Sterile as directed Electronic Signature(s) Signed: 10/24/2020 4:44:17 PM By: Lenda Kelp PA-C Signed: 10/27/2020 4:08:54 PM By: Yevonne Pax RN Entered By: Yevonne Pax on 10/24/2020 15:18:17 [name] [name] ([id number]) -------------------------------------------------------------------------------- Problem List Details Patient Name: [name] [name], KREISMAN. Date of Service: 10/24/2020 2:30 PM Medical Record Number: [id number] Patient Account  Number: [id number] Date of Birth/Sex: Sep 06, 1935 (86 y.o. M) Treating RN: Yevonne Pax Primary Care Provider: Gorden Harms Other Clinician: Referring Provider: Gorden Harms Treating Provider/Extender: Rowan Blase in Treatment: 2 Active Problems ICD-10 Encounter Code Description Active Date MDM Diagnosis E11.621 Type 2 diabetes mellitus with foot ulcer 10/10/2020 No Yes L97.522  Non-pressure chronic ulcer of other part of left foot with fat layer 10/10/2020 No Yes exposed N18.30 Chronic kidney disease, stage 3 unspecified 10/10/2020 No Yes I10 Essential (primary) hypertension 10/10/2020 No Yes I48.0 Paroxysmal atrial fibrillation 10/10/2020 No Yes Z79.01 Long term (current) use of anticoagulants 10/10/2020 No Yes Inactive Problems Resolved Problems Electronic Signature(s) Signed: 10/24/2020 2:40:29 PM By: Lenda Kelp PA-C Entered By: Lenda Kelp on 10/24/2020 14:40:28 Sickles, [name] [name] ([id number]) -------------------------------------------------------------------------------- Progress Note Details Patient Name: [name] [name]. Date of Service: 10/24/2020 2:30 PM Medical Record Number: [id number] Patient Account Number: [id number] Date of Birth/Sex: 1934-10-04 (86 y.o. M) Treating RN: Yevonne Pax Primary Care Provider: Gorden Harms Other Clinician: Referring Provider: Gorden Harms Treating Provider/Extender: Rowan Blase in Treatment: 2 Subjective Chief Complaint Information obtained from Patient Left foot ulcer History of Present Illness (HPI) 10/10/2020 upon evaluation today patient presents for initial evaluation in our clinic concerning a wound he has on the left lateral foot. 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 With that being said currently he has a wound portion of his left foot  that is what we are seeing him for today everything else appears to actually be doing quite well which is great news. There does not appear to be any signs of active infection at this time which is also great news. He does have a history of diabetes mellitus type 2, chronic kidney disease stage III, hypertension, paroxysmal atrial fibrillation, and long-term use of anticoagulant therapy due to the atrial fibrillation. He does not have any pain secondary to neuropathy. His most recent hemoglobin A1c was 6.5 roughly a year ago as best we can tell. He does have [facility] coming out currently to help take care of his wound. 10/24/2020 upon evaluation today patient appears to be doing well with regard to his wound. In fact I am very pleased with how things appear to be doing at this time. That is the least of his concerns at this time. Overall I am extremely pleased with the progress has been made. With that being said my biggest concern at this point he has actually not to do with his wound and other his blood pressure which is significantly elevated today. I am not certain that he has been taking his blood pressure upon further questioning with the patient and his family in fact I am almost certain that he is not. We did attempt to call his primary care provider but unfortunately we were unable to get them on the phone. The phone just rang. Nonetheless I think that he has an appointment with the cardiologist on Monday they can obviously recheck his blood pressure then. He also needs to get an appointment with his primary care provider likely to discuss his blood pressures. But also if he is not taking the medicine taking the medicine could obviously make a big difference in his blood pressure as it stands as well. Objective Constitutional Well-nourished and well-hydrated in no acute distress. Vitals Time Taken: 2:56 PM, Height: 70 in, Weight: 151 lbs, BMI: 21.7, Temperature: 98.9 F, Pulse: 76 bpm,  Respiratory Rate: 18 breaths/min, Blood Pressure: 222/89 mmHg. General Notes: notified Allen Derry of blood pressure Respiratory normal breathing without difficulty. Psychiatric this patient is able to make decisions and demonstrates good insight into disease process. Alert and  Oriented x 3. pleasant and cooperative. General Notes: Upon inspection patient's wound bed actually showed signs of good granulation epithelization no significant slough buildup that required sharp debridement I did mechanically debride which is saline and gauze the wound looked well post debridement. There is no signs of infection at this point. The patient has no headache, blurred vision, dizziness, nausea, or chest discomfort. Integumentary (Hair, Skin) Wound #1 status is Open. Original cause of wound was Gradually Appeared. The wound is located on the Left,Lateral Foot. The wound measures 0.1cm length x 1cm width x 0.2cm depth; 0.079cm^2 area and 0.016cm^3 volume. There is Fat Layer (Subcutaneous Tissue) exposed. There is no tunneling or undermining noted. There is a medium amount of serosanguineous drainage noted. There is large (67-100%) pink granulation within the wound bed. There is a small (1-33%) amount of necrotic tissue within the wound bed including Adherent Slough. Assessment [name] [name], GIRTON ([id number]) Active Problems ICD-10 Type 2 diabetes mellitus with foot ulcer Non-pressure chronic ulcer of other part of left foot with fat layer exposed Chronic kidney disease, stage 3 unspecified Essential (primary) hypertension Paroxysmal atrial fibrillation Long term (current) use of anticoagulants Plan Follow-up Appointments: Return Appointment in 2 weeks. Home Health: [facility] for wound care. May utilize formulary equivalent dressing for wound treatment orders unless otherwise specified. Home Health Nurse may visit PRN to address patient s wound care needs. - [facility] Bathing/ Shower/  Hygiene: Clean wound with Normal Saline or wound cleanser. May shower with wound dressing protected with water repellent cover or cast protector. Edema Control - Lymphedema / Segmental Compressive Device / Other: Elevate, Exercise Daily and Avoid Standing for Long Periods of Time. Elevate legs to the level of the heart and pump ankles as often as possible Elevate leg(s) parallel to the floor when sitting. Off-Loading: Open toe surgical shoe WOUND #1: - Foot Wound Laterality: Left, Lateral Cleanser: Normal Saline (Generic) 3 x Per Week/30 Days Discharge Instructions: Wash your hands with soap and water. Remove old dressing, discard into plastic bag and place into trash. Cleanse the wound with Normal Saline prior to applying a clean dressing using gauze sponges, not tissues or cotton balls. Do not scrub or use excessive force. Pat dry using gauze sponges, not tissue or cotton balls. Primary Dressing: Prisma 4.34 (in) (Generic) 3 x Per Week/30 Days Discharge Instructions: Moisten w/normal saline or sterile water; Cover wound as directed. Do not remove from wound bed. Secondary Dressing: Bordered Gauze Sterile-HBD 4x4 (in/in) (Generic) 3 x Per Week/30 Days Discharge Instructions: Cover wound with Bordered Guaze Sterile as directed 1. At this time I am going to suggest that with regard to the blood pressure the patient's family help to ensure that he is taking his blood pressure medication over the weekend. We will see what his blood pressure measures on Monday when he is seen over at cardiology. Obviously if it still elevated they can see about what to do that needs to be addressed if it is not elevated and they can likely recommend he just continue to take his medicines and most likely this area there is he was not taking them previously. 2. With regard to the wound we will get a continue with the silver collagen dressing I think that still doing the best form. 3. I am also can recommend patient  continue to elevate his legs much as possible and keep pressure off of the heel I think that is of utmost importance. We will see patient back for reevaluation in  1 week here in the clinic. If anything worsens or changes patient will contact our office for additional recommendations. Patient was advised that if he develops any symptoms such as headaches, nausea, dizziness, blurred vision, or chest pain he should go to the ER ASAP for evaluation and treatment. Electronic Signature(s) Signed: 10/24/2020 3:56:30 PM By: Lenda Kelp PA-C Previous Signature: 10/24/2020 3:49:03 PM Version By: Lenda Kelp PA-C Entered By: Lenda Kelp on 10/24/2020 15:56:30 [name] [name], [name] [name] ([id number]) -------------------------------------------------------------------------------- SuperBill Details Patient Name: [name] [name] Date of Service: 10/24/2020 Medical Record Number: [id number] Patient Account Number: [id number] Date of Birth/Sex: 1934/12/02 (86 y.o. M) Treating RN: Yevonne Pax Primary Care Provider: Gorden Harms Other Clinician: Referring Provider: Gorden Harms Treating Provider/Extender: Rowan Blase in Treatment: 2 Diagnosis Coding ICD-10 Codes Code Description [phone] Type 2 diabetes mellitus with foot ulcer L97.522 Non-pressure chronic ulcer of other part of left foot with fat layer exposed N18.30 Chronic kidney disease, stage 3 unspecified I10 Essential (primary) hypertension I48.0 Paroxysmal atrial fibrillation Z79.01 Long term (current) use of anticoagulants Facility Procedures CPT4 Code: [id number] Description: 99213 - WOUND CARE VISIT-LEV 3 EST PT Modifier: Quantity: 1 Physician Procedures CPT4 Code: [id number] Description: 99214 - WC PHYS LEVEL 4 - EST PT Modifier: Quantity: 1 CPT4 Code: Description: ICD-10 Diagnosis Description E11.621 Type 2 diabetes mellitus with foot ulcer L97.522 Non-pressure chronic ulcer of other part of left foot with fat layer ex  N18.30 Chronic kidney disease, stage 3 unspecified I10 Essential (primary)  hypertension Modifier: posed Quantity: Electronic Signature(s) Signed: 10/24/2020 3:56:54 PM By: Lenda Kelp PA-C Previous Signature: 10/24/2020 3:49:18 PM Version By: Lenda Kelp PA-C Entered By: Lenda Kelp on 10/24/2020 15:56:53

## 2020-10-27 ENCOUNTER — Telehealth (HOSPITAL_COMMUNITY): Payer: Self-pay | Admitting: Family Medicine

## 2020-10-27 ENCOUNTER — Ambulatory Visit (HOSPITAL_COMMUNITY)
Admission: RE | Admit: 2020-10-27 | Discharge: 2020-10-27 | Disposition: A | Discharge status: Home / Self Care | Payer: Medicare Other | Source: Ambulatory Visit | Attending: Family Medicine | Admitting: Family Medicine

## 2020-10-27 ENCOUNTER — Telehealth (HOSPITAL_COMMUNITY): Payer: Self-pay | Admitting: Cardiology

## 2020-10-27 ENCOUNTER — Encounter (HOSPITAL_COMMUNITY): Payer: Self-pay

## 2020-10-27 ENCOUNTER — Other Ambulatory Visit: Payer: Self-pay

## 2020-10-27 VITALS — BP 216/90 | HR 73 | Wt 152.6 lb

## 2020-10-27 DIAGNOSIS — I130 Hypertensive heart and chronic kidney disease with heart failure and stage 1 through stage 4 chronic kidney disease, or unspecified chronic kidney disease: Secondary | ICD-10-CM | POA: Insufficient documentation

## 2020-10-27 DIAGNOSIS — I472 Ventricular tachycardia, unspecified: Secondary | ICD-10-CM

## 2020-10-27 DIAGNOSIS — I5042 Chronic combined systolic (congestive) and diastolic (congestive) heart failure: Secondary | ICD-10-CM | POA: Diagnosis not present

## 2020-10-27 DIAGNOSIS — Z87891 Personal history of nicotine dependence: Secondary | ICD-10-CM | POA: Insufficient documentation

## 2020-10-27 DIAGNOSIS — R64 Cachexia: Secondary | ICD-10-CM | POA: Insufficient documentation

## 2020-10-27 DIAGNOSIS — Z7901 Long term (current) use of anticoagulants: Secondary | ICD-10-CM | POA: Diagnosis not present

## 2020-10-27 DIAGNOSIS — I5022 Chronic systolic (congestive) heart failure: Secondary | ICD-10-CM | POA: Insufficient documentation

## 2020-10-27 DIAGNOSIS — I2510 Atherosclerotic heart disease of native coronary artery without angina pectoris: Secondary | ICD-10-CM | POA: Diagnosis not present

## 2020-10-27 DIAGNOSIS — I160 Hypertensive urgency: Secondary | ICD-10-CM | POA: Insufficient documentation

## 2020-10-27 DIAGNOSIS — E1122 Type 2 diabetes mellitus with diabetic chronic kidney disease: Secondary | ICD-10-CM | POA: Insufficient documentation

## 2020-10-27 DIAGNOSIS — Z79899 Other long term (current) drug therapy: Secondary | ICD-10-CM | POA: Insufficient documentation

## 2020-10-27 DIAGNOSIS — I428 Other cardiomyopathies: Secondary | ICD-10-CM | POA: Insufficient documentation

## 2020-10-27 DIAGNOSIS — N183 Chronic kidney disease, stage 3 unspecified: Secondary | ICD-10-CM | POA: Insufficient documentation

## 2020-10-27 DIAGNOSIS — N1832 Chronic kidney disease, stage 3b: Secondary | ICD-10-CM

## 2020-10-27 LAB — COMPREHENSIVE METABOLIC PANEL
ALT: 10 U/L (ref 0–44)
AST: 18 U/L (ref 15–41)
Albumin: 3.1 g/dL — ABNORMAL LOW (ref 3.5–5.0)
Alkaline Phosphatase: 89 U/L (ref 38–126)
Anion gap: 9 (ref 5–15)
BUN: 8 mg/dL (ref 8–23)
CO2: 25 mmol/L (ref 22–32)
Calcium: 8.7 mg/dL — ABNORMAL LOW (ref 8.9–10.3)
Chloride: 106 mmol/L (ref 98–111)
Creatinine, Ser: 1.47 mg/dL — ABNORMAL HIGH (ref 0.61–1.24)
GFR, Estimated: 47 mL/min — ABNORMAL LOW (ref >60)
Glucose, Bld: 116 mg/dL — ABNORMAL HIGH (ref 70–99)
Potassium: 2.8 mmol/L — ABNORMAL LOW (ref 3.5–5.1)
Sodium: 140 mmol/L (ref 135–145)
Total Bilirubin: 0.6 mg/dL (ref 0.3–1.2)
Total Protein: 7.2 g/dL (ref 6.5–8.1)

## 2020-10-27 LAB — TSH: TSH: 0.426 u[IU]/mL (ref 0.350–4.500)

## 2020-10-27 LAB — T4, FREE: Free T4: 1.07 ng/dL (ref 0.61–1.12)

## 2020-10-27 LAB — BRAIN NATRIURETIC PEPTIDE: B Natriuretic Peptide: 227.2 pg/mL — ABNORMAL HIGH (ref 0.0–100.0)

## 2020-10-27 MED ORDER — BIDIL 20-37.5 MG PO TABS: 2.0000 | ORAL_TABLET | Freq: Three times a day (TID) | ORAL | 3 refills | Status: AC

## 2020-10-27 MED ORDER — MAGNESIUM OXIDE 400 MG PO TABS: 0.5000 | ORAL_TABLET | Freq: Every day | ORAL | 3 refills | Status: DC

## 2020-10-27 MED ORDER — ENTRESTO 49-51 MG PO TABS
1.0000 | ORAL_TABLET | Freq: Two times a day (BID) | ORAL | 6 refills | Status: DC
Start: 2020-10-27 — End: 2023-08-29

## 2020-10-27 MED ORDER — ATORVASTATIN CALCIUM 40 MG PO TABS: 40.0000 mg | ORAL_TABLET | Freq: Every day | ORAL | 3 refills | Status: DC

## 2020-10-27 MED ORDER — MEXILETINE HCL 150 MG PO CAPS: 300.0000 mg | ORAL_CAPSULE | Freq: Two times a day (BID) | ORAL | 6 refills | Status: DC

## 2020-10-27 MED ORDER — HYDRALAZINE HCL 50 MG PO TABS
75.0000 mg | ORAL_TABLET | Freq: Once | ORAL | Status: AC
  Administered 2020-10-27: 75 mg via ORAL

## 2020-10-27 MED ORDER — APIXABAN 2.5 MG PO TABS: 2.5000 mg | ORAL_TABLET | Freq: Two times a day (BID) | ORAL | 6 refills | Status: DC

## 2020-10-27 MED ORDER — SPIRONOLACTONE 25 MG PO TABS
12.5000 mg | ORAL_TABLET | Freq: Every day | ORAL | 1 refills | Status: DC
Start: 2020-10-27 — End: 2021-01-22

## 2020-10-27 MED ORDER — AMIODARONE HCL 200 MG PO TABS: 200.0000 mg | ORAL_TABLET | Freq: Every day | ORAL | 3 refills | Status: DC

## 2020-10-27 MED ORDER — POTASSIUM CHLORIDE ER 20 MEQ PO TBCR: 60.0000 meq | EXTENDED_RELEASE_TABLET | Freq: Two times a day (BID) | ORAL | 3 refills | Status: DC

## 2020-10-27 NOTE — Telephone Encounter (Signed)
Spoke to patient and patient's niece, Bonita Quin. Reviewed labs with both, instructed patient to take 60 mEq of KCl twice daily, along with re-starting spiro 12.5 mg daily. Will need repeat BMET Friday and another BMET in 2 weeks. Patient and niece understand plan and agreeable. Medications sent to [facility] in [place].  - Can we schedule repeat labs closer to [place]?

## 2020-10-27 NOTE — Telephone Encounter (Signed)
appt reminder call made to patient Pt reports he will be present for appt Parking details shared with patient

## 2020-10-27 NOTE — Addendum Note (Signed)
Encounter addended by: Jacklynn Ganong, FNP on: 10/27/2020 5:23 PM  Actions taken: Order list changed

## 2020-10-27 NOTE — Patient Instructions (Signed)
INCREASE Bidil to 2 tab three times per day INCREASE Entresto to 49/51 mg, one tab twice a day  Labs today We will only contact you if something comes back abnormal or we need to make some changes. Otherwise no news is good news!  Labs needed in 7-10 days  Your physician has requested that you regularly monitor and record your blood pressure readings at home. Please use the same machine at the same time of day to check your readings and record them to bring to your follow-up visit. -please return for a blood pressure check in the office in 2 weeks or call triage with readings  Your physician recommends that you schedule a follow-up appointment in: 2 months with Dr [name] and echo  Your physician has requested that you have an echocardiogram. Echocardiography is a painless test that uses sound waves to create images of your heart. It provides your doctor with information about the size and shape of your heart and how well your heart's chambers and valves are working. This procedure takes approximately one hour. There are no restrictions for this procedure.  If you have any questions or concerns before your next appointment please send [place] a message through [place] or call our office at [phone].    TO LEAVE A MESSAGE FOR THE NURSE SELECT OPTION 2, PLEASE LEAVE A MESSAGE INCLUDING: . YOUR NAME . DATE OF BIRTH . CALL BACK NUMBER . REASON FOR CALL**this is important as we prioritize the call backs  YOU WILL RECEIVE A CALL BACK THE SAME DAY AS LONG AS YOU CALL BEFORE 4:00 PM  Do the following things EVERYDAY: 1) Weigh yourself in the morning before breakfast. Write it down and keep it in a log. 2) Take your medicines as prescribed 3) Eat low salt foods-Limit salt (sodium) to 2000 mg per day.  4) Stay as active as you can everyday 5) Limit all fluids for the day to less than 2 liters  At the [facility], you and your health needs are our priority. As part of our  continuing mission to provide you with exceptional heart care, we have created designated Provider Care Teams. These Care Teams include your primary Cardiologist (physician) and Advanced Practice Providers (APPs- Physician Assistants and Nurse Practitioners) who all work together to provide you with the care you need, when you need it.   You may see any of the following providers on your designated Care Team at your next follow up: Marland Kitchen Dr Arvilla Meres . Dr Marca Ancona . Tonye Becket, NP . Robbie Lis, PA . Shanda Bumps [place],NP . Karle Plumber, PharmD   Please be sure to bring in all your medications bottles to every appointment.

## 2020-10-28 ENCOUNTER — Telehealth (HOSPITAL_COMMUNITY): Payer: Self-pay | Admitting: Family Medicine

## 2020-10-28 LAB — T3, FREE: T3, Free: 2.4 pg/mL (ref 2.0–4.4)

## 2020-10-28 NOTE — Telephone Encounter (Signed)
Pt aware via Bonita Quin  order for repeat bmet sent to [facility] [place] Fax #[phone]

## 2020-11-07 ENCOUNTER — Ambulatory Visit: Payer: Medicare Other | Admitting: Physician Assistant

## 2020-11-18 ENCOUNTER — Other Ambulatory Visit (HOSPITAL_COMMUNITY): Payer: Self-pay | Admitting: Family Medicine

## 2020-11-18 ENCOUNTER — Ambulatory Visit: Payer: Medicare Other | Admitting: Physician Assistant

## 2020-11-24 ENCOUNTER — Ambulatory Visit: Payer: Medicare Other | Admitting: Physician Assistant

## 2020-12-01 ENCOUNTER — Ambulatory Visit: Payer: Medicare Other | Admitting: Physician Assistant

## 2020-12-25 ENCOUNTER — Encounter (HOSPITAL_COMMUNITY): Payer: Medicare Other | Admitting: Internal Medicine

## 2020-12-25 ENCOUNTER — Other Ambulatory Visit (HOSPITAL_COMMUNITY): Payer: Self-pay | Admitting: Family Medicine

## 2020-12-25 ENCOUNTER — Ambulatory Visit (HOSPITAL_COMMUNITY): Admission: RE | Admit: 2020-12-25 | Payer: Medicare Other | Source: Ambulatory Visit

## 2021-01-01 ENCOUNTER — Encounter: Payer: Medicare Other | Attending: Physician Assistant | Admitting: Physician Assistant

## 2021-01-01 ENCOUNTER — Other Ambulatory Visit: Payer: Self-pay

## 2021-01-01 DIAGNOSIS — E11621 Type 2 diabetes mellitus with foot ulcer: Secondary | ICD-10-CM | POA: Diagnosis not present

## 2021-01-01 DIAGNOSIS — I480 Paroxysmal atrial fibrillation: Secondary | ICD-10-CM | POA: Diagnosis not present

## 2021-01-01 DIAGNOSIS — E1122 Type 2 diabetes mellitus with diabetic chronic kidney disease: Secondary | ICD-10-CM | POA: Diagnosis not present

## 2021-01-01 DIAGNOSIS — I509 Heart failure, unspecified: Secondary | ICD-10-CM | POA: Diagnosis not present

## 2021-01-01 DIAGNOSIS — E1140 Type 2 diabetes mellitus with diabetic neuropathy, unspecified: Secondary | ICD-10-CM | POA: Insufficient documentation

## 2021-01-01 DIAGNOSIS — Z7901 Long term (current) use of anticoagulants: Secondary | ICD-10-CM | POA: Diagnosis not present

## 2021-01-01 DIAGNOSIS — J449 Chronic obstructive pulmonary disease, unspecified: Secondary | ICD-10-CM | POA: Diagnosis not present

## 2021-01-01 DIAGNOSIS — I130 Hypertensive heart and chronic kidney disease with heart failure and stage 1 through stage 4 chronic kidney disease, or unspecified chronic kidney disease: Secondary | ICD-10-CM | POA: Diagnosis not present

## 2021-01-01 DIAGNOSIS — N183 Chronic kidney disease, stage 3 unspecified: Secondary | ICD-10-CM | POA: Insufficient documentation

## 2021-01-01 DIAGNOSIS — L97522 Non-pressure chronic ulcer of other part of left foot with fat layer exposed: Secondary | ICD-10-CM | POA: Diagnosis not present

## 2021-01-01 NOTE — Progress Notes (Signed)
[name] [name], [name] [name] ([id number]) Visit Report for 01/01/2021 Arrival Information Details Patient Name: [name] [name], [name] [name]. Date of Service: 01/01/2021 10:15 AM Medical Record Number: [id number] Patient Account Number: [id number] Date of Birth/Sex: 09/24/1934 (86 y.o. M) Treating RN: Donnamarie Poag Primary Care Melbert Botelho: Crissie Figures Other Clinician: Jeanine Luz Referring Kalianne Fetting: Crissie Figures Treating Keyaan Lederman/Extender: Skipper Cliche in Treatment: 11 Visit Information History Since Last Visit Added or deleted any medications: No Patient Arrived: Kasandra Knudsen Had a fall or experienced change in No Arrival Time: 11:02 activities of daily living that may affect Accompanied By: self risk of falls: Transfer Assistance: None Hospitalized since last visit: No Patient Requires Transmission-Based No Has Dressing in Place as Prescribed: Yes Precautions: Pain Present Now: No Patient Has Alerts: Yes Patient Alerts: Patient on Blood Thinner NON [address] Signature(s) Signed: 01/01/2021 2:33:38 PM By: Donnamarie Poag Entered By: Donnamarie Poag on 01/01/2021 11:02:55 [name] [name] ([id number]) -------------------------------------------------------------------------------- Clinic Level of Care Assessment Details Patient Name: [name] [name]. Date of Service: 01/01/2021 10:15 AM Medical Record Number: [id number] Patient Account Number: [id number] Date of Birth/Sex: [birth year] (86 y.o. M) Treating RN: Dolan Amen Primary Care Donal Lynam: Crissie Figures Other Clinician: Jeanine Luz Referring Lajarvis Italiano: Crissie Figures Treating Keiondra Brookover/Extender: Skipper Cliche in Treatment: 11 Clinic Level of Care Assessment Items TOOL 1 Quantity Score _0  - Use when EandM and Procedure is performed on INITIAL visit 0 ASSESSMENTS - Nursing Assessment / Reassessment _1  - General Physical Exam (combine w/ comprehensive assessment (listed just below) when performed on new 0 pt.  evals) _2  - 0 Comprehensive Assessment (HX, ROS, Risk Assessments, Wounds Hx, etc.) ASSESSMENTS - Wound and Skin Assessment / Reassessment _3  - Dermatologic / Skin Assessment (not related to wound area) 0 ASSESSMENTS - Ostomy and/or Continence Assessment and Care _4  - Incontinence Assessment and Management 0 _5  - 0 Ostomy Care Assessment and Management (repouching, etc.) PROCESS - Coordination of Care _6  - Simple Patient / Family Education for ongoing care 0 _7  - 0 Complex (extensive) Patient / Family Education for ongoing care _8  - 0 Staff obtains Programmer, systems, Records, Test Results / Process Orders _9  - 0 Staff telephones HHA, Nursing Homes / Clarify orders / etc _10  - 0 Routine Transfer to another Facility (non-emergent condition) _11  - 0 Routine Hospital Admission (non-emergent condition) _12  - 0 New Admissions / Biomedical engineer / Ordering NPWT, Apligraf, etc. _13  - 0 Emergency Hospital Admission (emergent condition) PROCESS - Special Needs _14  - Pediatric / Minor Patient Management 0 _15  - 0 Isolation Patient Management _16  - 0 Hearing / Language / Visual special needs _17  - 0 Assessment of Community assistance (transportation, D/C planning, etc.) _18  - 0 Additional assistance / Altered mentation _19  - 0 Support Surface(s) Assessment (bed, cushion, seat, etc.) INTERVENTIONS - Miscellaneous _20  - External ear exam 0 _21  - 0 Patient Transfer (multiple staff / Civil Service fast streamer / Similar devices) _22  - 0 Simple Staple / Suture removal (25 or less) _23  - 0 Complex Staple / Suture removal (26 or more) _24  - 0 Hypo/Hyperglycemic Management (do not check if billed separately) _25  - 0 Ankle / Brachial Index (ABI) - do not check if billed separately Has the patient been seen at the hospital within the last three years: Yes Total Score: 0 Level Of Care: ____ [name] [name] ([id number]) Electronic Signature(s) Signed: 01/01/2021 5:15:58 PM By: Georges Mouse, Minus Breeding RN Entered By:  Georges Mouse, Minus Breeding on 01/01/2021 11:50:23 [name] [name] ([id number]) -------------------------------------------------------------------------------- Encounter Discharge Information Details Patient Name: [name] [name]. Date  of Service: 01/01/2021 10:15 AM Medical Record Number: [id number] Patient Account Number: [id number] Date of Birth/Sex: 1935-07-24 (86 y.o. M) Treating RN: Donnamarie Poag Primary Care Sofiya Ezelle: Crissie Figures Other Clinician: Jeanine Luz Referring Darsha Zumstein: Crissie Figures Treating Allison Silva/Extender: Skipper Cliche in Treatment: 11 Encounter Discharge Information Items Post Procedure Vitals Discharge Condition: Stable Temperature (F): 97.8 Ambulatory Status: Cane Pulse (bpm): 66 Discharge Destination: Home Respiratory Rate (breaths/min): 18 Transportation: Other Blood Pressure (mmHg): 180/81 Accompanied By: self Schedule Follow-up Appointment: Yes Clinical Summary of Care: Electronic Signature(s) Signed: 01/01/2021 2:33:38 PM By: Donnamarie Poag Entered By: Donnamarie Poag on 01/01/2021 11:59:33 Thang, Eric Form ([id number]) -------------------------------------------------------------------------------- Lower Extremity Assessment Details Patient Name: [name] [name]. Date of Service: 01/01/2021 10:15 AM Medical Record Number: [id number] Patient Account Number: [id number] Date of Birth/Sex: 12-Apr-1935 (86 y.o. M) Treating RN: Donnamarie Poag Primary Care Kayvion Arneson: Crissie Figures Other Clinician: Jeanine Luz Referring Deke Tilghman: Crissie Figures Treating Ashima Shrake/Extender: Skipper Cliche in Treatment: 11 Edema Assessment Assessed: [Left: No] Patrice Paradise: No] [Left: Edema] [Right: :] Calf Left: Right: Point of Measurement: 33 cm From Medial Instep 29 cm Ankle Left: Right: Point of Measurement: 10 cm From Medial Instep 20.5 cm Knee To Floor Left: Right: From Medial Instep 43 cm Vascular Assessment Pulses: Dorsalis Pedis Palpable:  [Left:Yes] Electronic Signature(s) Signed: 01/01/2021 2:33:38 PM By: Donnamarie Poag Entered By: Donnamarie Poag on 01/01/2021 11:12:23 [name] [name] ([id number]) -------------------------------------------------------------------------------- Multi Wound Chart Details Patient Name: [name] [name]. Date of Service: 01/01/2021 10:15 AM Medical Record Number: [id number] Patient Account Number: [id number] Date of Birth/Sex: Sep 14, 1934 (86 y.o. M) Treating RN: Dolan Amen Primary Care Tillie Viverette: Crissie Figures Other Clinician: Jeanine Luz Referring Roniya Tetro: Crissie Figures Treating Hance Caspers/Extender: Skipper Cliche in Treatment: 11 Vital Signs Height(in): 70 Pulse(bpm): 24 Weight(lbs): 151 Blood Pressure(mmHg): 180/81 Body Mass Index(BMI): 22 Temperature(F): 98.2 Respiratory Rate(breaths/min): 18 Photos: [N/A:N/A] Wound Location: Left, Lateral Foot N/A N/A Wounding Event: Gradually Appeared N/A N/A Primary Etiology: Diabetic Wound/Ulcer of the Lower N/A N/A Extremity Comorbid History: Chronic Obstructive Pulmonary N/A N/A Disease (COPD), Congestive Heart Failure, Hypertension, Myocardial Infarction, Type II Diabetes Date Acquired: 09/14/2019 N/A N/A Weeks of Treatment: 11 N/A N/A Wound Status: Open N/A N/A Measurements L x W x D (cm) 0.2x0.8x0.4 N/A N/A Area (cm) : 0.126 N/A N/A Volume (cm) : 0.05 N/A N/A % Reduction in Area: 96.20% N/A N/A % Reduction in Volume: 92.40% N/A N/A Starting Position 1 (o'clock): 2 Ending Position 1 (o'clock): 4 Maximum Distance 1 (cm): 0.6 Undermining: Yes N/A N/A Classification: Grade 2 N/A N/A Exudate Amount: Medium N/A N/A Exudate Type: Serosanguineous N/A N/A Exudate Color: red, brown N/A N/A Granulation Amount: Large (67-100%) N/A N/A Granulation Quality: Red, Pink N/A N/A Necrotic Amount: Small (1-33%) N/A N/A Necrotic Tissue: Eschar, Adherent Slough N/A N/A Exposed Structures: Fat Layer (Subcutaneous Tissue): N/A  N/A Yes Fascia: No Tendon: No Muscle: No Joint: No Bone: No Epithelialization: None N/A N/A Treatment Notes Electronic Signature(s) [name] [name], [name] [name] ([id number]) Signed: 01/01/2021 5:15:58 PM By: Georges Mouse, Minus Breeding RN Entered By: Georges Mouse, Minus Breeding on 01/01/2021 11:44:30 [name] [name] ([id number]) -------------------------------------------------------------------------------- Multi-Disciplinary Care Plan Details Patient Name: [name] [name]. Date of Service: 01/01/2021 10:15 AM Medical Record Number: [id number] Patient Account Number: [id number] Date of Birth/Sex: Nov 01, 1934 (86 y.o. M) Treating RN: Dolan Amen Primary Care Indria Bishara: Crissie Figures Other Clinician: Jeanine Luz Referring Dyasia Firestine: Crissie Figures Treating Madora Barletta/Extender: Skipper Cliche in Treatment: 11 Active Inactive Wound/Skin Impairment Nursing Diagnoses: Knowledge deficit related to ulceration/compromised skin integrity Goals: Patient/caregiver will  verbalize understanding of skin care regimen Date Initiated: 10/10/2020 Date Inactivated: 01/01/2021 Target Resolution Date: 11/10/2020 Goal Status: Met Ulcer/skin breakdown will have a volume reduction of 30% by week 4 Date Initiated: 10/10/2020 Target Resolution Date: 11/10/2020 Goal Status: Active Ulcer/skin breakdown will have a volume reduction of 50% by week 8 Date Initiated: 10/10/2020 Target Resolution Date: 12/08/2020 Goal Status: Active Ulcer/skin breakdown will have a volume reduction of 80% by week 12 Date Initiated: 10/10/2020 Target Resolution Date: 01/08/2021 Goal Status: Active Ulcer/skin breakdown will heal within 14 weeks Date Initiated: 10/10/2020 Target Resolution Date: 02/07/2021 Goal Status: Active Interventions: Assess patient/caregiver ability to obtain necessary supplies Assess patient/caregiver ability to perform ulcer/skin care regimen upon admission and as needed Assess ulceration(s) every  visit Notes: Electronic Signature(s) Signed: 01/01/2021 5:15:58 PM By: Georges Mouse, Minus Breeding RN Entered By: Georges Mouse, Minus Breeding on 01/01/2021 11:44:24 [name] [name] ([id number]) -------------------------------------------------------------------------------- Pain Assessment Details Patient Name: [name] [name]. Date of Service: 01/01/2021 10:15 AM Medical Record Number: [id number] Patient Account Number: [id number] Date of Birth/Sex: 08/23/1935 (86 y.o. M) Treating RN: Donnamarie Poag Primary Care Jazir Newey: Crissie Figures Other Clinician: Jeanine Luz Referring Liberty Seto: Crissie Figures Treating Bernie Ransford/Extender: Skipper Cliche in Treatment: 11 Active Problems Location of Pain Severity and Description of Pain Patient Has Paino No Site Locations Rate the pain. Current Pain Level: 0 Pain Management and Medication Current Pain Management: Electronic Signature(s) Signed: 01/01/2021 2:33:38 PM By: Donnamarie Poag Entered By: Donnamarie Poag on 01/01/2021 11:06:00 [name] [name] ([id number]) -------------------------------------------------------------------------------- Patient/Caregiver Education Details Patient Name: [name] [name]. Date of Service: 01/01/2021 10:15 AM Medical Record Number: [id number] Patient Account Number: [id number] Date of Birth/Gender: [birth year] (86 y.o. M) Treating RN: Dolan Amen Primary Care Physician: Crissie Figures Other Clinician: Jeanine Luz Referring Physician: Crissie Figures Treating Physician/Extender: Skipper Cliche in Treatment: 11 Education Assessment Education Provided To: Patient Education Topics Provided Wound/Skin Impairment: Methods: Explain/Verbal Responses: State content correctly Electronic Signature(s) Signed: 01/01/2021 5:15:58 PM By: Georges Mouse, Minus Breeding RN Entered By: Georges Mouse, Minus Breeding on 01/01/2021 11:50:38 [name] [name]  ([id number]) -------------------------------------------------------------------------------- Wound Assessment Details Patient Name: [name] [name], [name] [name]. Date of Service: 01/01/2021 10:15 AM Medical Record Number: [id number] Patient Account Number: [id number] Date of Birth/Sex: [birth year] (86 y.o. M) Treating RN: Donnamarie Poag Primary Care Derreon Consalvo: Crissie Figures Other Clinician: Jeanine Luz Referring Krissia Schreier: Crissie Figures Treating Eily Louvier/Extender: Skipper Cliche in Treatment: 11 Wound Status Wound Number: 1 Primary Diabetic Wound/Ulcer of the Lower Extremity Etiology: Wound Location: Left, Lateral Foot Wound Open Wounding Event: Gradually Appeared Status: Date Acquired: 09/14/2019 Comorbid Chronic Obstructive Pulmonary Disease (COPD), Weeks Of Treatment: 11 History: Congestive Heart Failure, Hypertension, Myocardial Clustered Wound: No Infarction, Type II Diabetes Photos Wound Measurements Length: (cm) 0.2 % Red Width: (cm) 0.8 % Red Depth: (cm) 0.4 Epith Area: (cm) 0.126 Unde Volume: (cm) 0.05 S En Ma uction in Area: 96.2% uction in Volume: 92.4% elialization: None rmining: Yes tarting Position (o'clock): 2 ding Position (o'clock): 4 ximum Distance: (cm) 0.6 Wound Description Classification: Grade 2 Foul Exudate Amount: Medium Sloug Exudate Type: Serosanguineous Exudate Color: red, brown Odor After Cleansing: No h/Fibrino Yes Wound Bed Granulation Amount: Large (67-100%) Exposed Structure Granulation Quality: Red, Pink Fascia Exposed: No Necrotic Amount: Small (1-33%) Fat Layer (Subcutaneous Tissue) Exposed: Yes Necrotic Quality: Eschar, Adherent Slough Tendon Exposed: No Muscle Exposed: No Joint Exposed: No Bone Exposed: No Treatment Notes Wound #1 (Foot) Wound Laterality: Left, Lateral Cleanser Normal Saline Mukherjee, Bernard L. ([id number]) Discharge Instruction: Wash your hands with soap and water.  Remove old dressing, discard into plastic  bag and place into trash. Cleanse the wound with Normal Saline prior to applying a clean dressing using gauze sponges, not tissues or cotton balls. Do not scrub or use excessive force. Pat dry using gauze sponges, not tissue or cotton balls. Peri-Wound Care Topical Primary Dressing Silvercel Small 2x2 (in/in) Discharge Instruction: Apply Silvercel Small 2x2 (in/in) as instructed Secondary Dressing Bordered Gauze Sterile-HBD 4x4 (in/in) Discharge Instruction: Cover wound with Bordered Guaze Sterile as directed Secured With Compression Wrap Compression Stockings Add-Ons Electronic Signature(s) Signed: 01/01/2021 2:33:38 PM By: Donnamarie Poag Entered By: Donnamarie Poag on 01/01/2021 11:11:08 [name] [name] ([id number]) -------------------------------------------------------------------------------- [place] Details Patient Name: [name] [name]. Date of Service: 01/01/2021 10:15 AM Medical Record Number: [id number] Patient Account Number: [id number] Date of Birth/Sex: October 10, 1934 (86 y.o. M) Treating RN: Donnamarie Poag Primary Care Alpha Chouinard: Crissie Figures Other Clinician: Jeanine Luz Referring Jonel Weldon: Crissie Figures Treating Dula Havlik/Extender: Skipper Cliche in Treatment: 11 Vital Signs Time Taken: 11:04 Temperature (F): 98.2 Height (in): 70 Pulse (bpm): 66 Weight (lbs): 151 Respiratory Rate (breaths/min): 18 Body Mass Index (BMI): 21.7 Blood Pressure (mmHg): 180/81 Reference Range: 80 - 120 mg / dl Electronic Signature(s) Signed: 01/01/2021 2:33:38 PM By: Donnamarie Poag Entered ByDonnamarie Poag on 01/01/2021 11:04:34

## 2021-01-02 NOTE — Progress Notes (Signed)
[name] [name] ([id number]) Visit Report for 01/01/2021 Chief Complaint Document Details Patient Name: [name] [name]. Date of Service: 01/01/2021 10:15 AM Medical Record Number: [id number] Patient Account Number: [id number] Date of Birth/Sex: Oct 02, 1934 (86 y.o. M) Treating RN: Rogers Blocker Primary Care Provider: Gorden Harms Other Clinician: Lolita Cram Referring Provider: Gorden Harms Treating Provider/Extender: Rowan Blase in Treatment: 11 Information Obtained from: Patient Chief Complaint Left foot ulcer Electronic Signature(s) Signed: 01/01/2021 11:50:13 AM By: Lenda Kelp PA-C Entered By: Lenda Kelp on 01/01/2021 11:50:12 [name] [name] ([id number]) -------------------------------------------------------------------------------- Debridement Details Patient Name: [name] [name] Date of Service: 01/01/2021 10:15 AM Medical Record Number: [id number] Patient Account Number: [id number] Date of Birth/Sex: 07/25/1935 (86 y.o. M) Treating RN: Rogers Blocker Primary Care Provider: Gorden Harms Other Clinician: Lolita Cram Referring Provider: Gorden Harms Treating Provider/Extender: Rowan Blase in Treatment: 11 Debridement Performed for Wound #1 Left,Lateral Foot Assessment: Performed By: Physician Nelida Meuse., PA-C Debridement Type: Debridement Severity of Tissue Pre Debridement: Fat layer exposed Level of Consciousness (Pre- Awake and Alert procedure): Pre-procedure Verification/Time Out Yes - 11:44 Taken: Start Time: 11:44 Total Area Debrided (L x W): 2 (cm) x 2 (cm) = 4 (cm) Tissue and other material Viable, Non-Viable, Callus, Slough, Subcutaneous, Slough debrided: Level: Skin/Subcutaneous Tissue Debridement Description: Excisional Instrument: Curette Bleeding: Minimum Hemostasis Achieved: Pressure Response to Treatment: Procedure was tolerated well Level of Consciousness (Post- Awake and  Alert procedure): Post Debridement Measurements of Total Wound Length: (cm) 1.2 Width: (cm) 1.6 Depth: (cm) 0.4 Volume: (cm) 0.603 Character of Wound/Ulcer Post Debridement: Stable Severity of Tissue Post Debridement: Fat layer exposed Post Procedure Diagnosis Same as Pre-procedure Electronic Signature(s) Signed: 01/01/2021 5:15:58 PM By: Lajean Manes RN Signed: 01/02/2021 1:09:49 PM By: Lenda Kelp PA-C Entered By: Phillis Haggis, Dondra Prader on 01/01/2021 11:49:06 [name] [name] ([id number]) -------------------------------------------------------------------------------- HPI Details Patient Name: [name] [name]. Date of Service: 01/01/2021 10:15 AM Medical Record Number: [id number] Patient Account Number: [id number] Date of Birth/Sex: August 11, 1935 (86 y.o. M) Treating RN: Rogers Blocker Primary Care Provider: Gorden Harms Other Clinician: Lolita Cram Referring Provider: Gorden Harms Treating Provider/Extender: Rowan Blase in Treatment: 11 History of Present Illness HPI Description: 10/10/2020 upon evaluation today patient presents for initial evaluation in our clinic concerning a wound he has on the left lateral foot. 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 With that being said currently he has a wound portion of his left foot that is what we are seeing him for today everything else appears to actually be doing quite well which is great news. There does not appear to be any signs of active infection at this time which is also great news. He does have a history of diabetes mellitus type 2, chronic kidney disease stage III, hypertension, paroxysmal atrial fibrillation, and long-term use of anticoagulant therapy due to the atrial fibrillation. He does not have any pain secondary to neuropathy.  His most recent hemoglobin A1c was 6.5 roughly a year ago as best we can tell. He does have [facility] coming out currently to help take care of his wound. 10/24/2020 upon evaluation today patient appears to be doing well with regard to his wound. In fact I am very pleased with how things appear to be doing at this time. That is the least of his  concerns at this time. Overall I am extremely pleased with the progress has been made. With that being said my biggest concern at this point he has actually not to do with his wound and other his blood pressure which is significantly elevated today. I am not certain that he has been taking his blood pressure upon further questioning with the patient and his family in fact I am almost certain that he is not. We did attempt to call his primary care provider but unfortunately we were unable to get them on the phone. The phone just rang. Nonetheless I think that he has an appointment with the cardiologist on Monday they can obviously recheck his blood pressure then. He also needs to get an appointment with his primary care provider likely to discuss his blood pressures. But also if he is not taking the medicine taking the medicine could obviously make a big difference in his blood pressure as it stands as well. 01/01/2021 upon evaluation today patient appears to be doing decently well in regard to his foot ulcer. He is going require some sharp debridement is actually been quite sometime since have seen him I think the main issue here is that he unfortunately was having some issues with transportation and just not feeling well. Either way I think that we need to definitely get things back in order and seeing him on a regular basis. The patient voiced understanding. With that being said his wound is going require sharp debridement at this point. Electronic Signature(s) Signed: 01/01/2021 5:29:31 PM By: Lenda Kelp PA-C Entered By: Lenda Kelp on 01/01/2021  17:29:31 [name] [name] ([id number]) -------------------------------------------------------------------------------- Physical Exam Details Patient Name: [name], [name]. Date of Service: 01/01/2021 10:15 AM Medical Record Number: [id number] Patient Account Number: [id number] Date of Birth/Sex: 03/12/1935 (86 y.o. M) Treating RN: Rogers Blocker Primary Care Provider: Gorden Harms Other Clinician: Lolita Cram Referring Provider: Gorden Harms Treating Provider/Extender: Rowan Blase in Treatment: 11 Constitutional Well-nourished and well-hydrated in no acute distress. Respiratory normal breathing without difficulty. Psychiatric this patient is able to make decisions and demonstrates good insight into disease process. Alert and Oriented x 3. pleasant and cooperative. Notes Patient's wound bed showed signs of good granulation and epithelization at this point in some areas although there was callus buildup around the edges of the wound and others. I did perform sharp debridement to clear away the callus and necrotic tissue down to good subcutaneous tissue he tolerated that today without complication postdebridement wound bed appears to be doing much better. Electronic Signature(s) Signed: 01/01/2021 5:29:53 PM By: Lenda Kelp PA-C Entered By: Lenda Kelp on 01/01/2021 17:29:52 [name] [name] ([id number]) -------------------------------------------------------------------------------- Physician Orders Details Patient Name: [name], [name]. Date of Service: 01/01/2021 10:15 AM Medical Record Number: [id number] Patient Account Number: [id number] Date of Birth/Sex: 1934/11/05 (86 y.o. M) Treating RN: Rogers Blocker Primary Care Provider: Gorden Harms Other Clinician: Lolita Cram Referring Provider: Gorden Harms Treating Provider/Extender: Rowan Blase in Treatment: 11 Verbal / Phone Orders: No Diagnosis Coding Follow-up Appointments o  Return Appointment in 2 weeks. Home Health o [facility] for wound care. May utilize formulary equivalent dressing for wound treatment orders unless otherwise specified. Home Health Nurse may visit PRN to address patientos wound care needs. - [facility] Bathing/ Shower/ Hygiene o Clean wound with Normal Saline or wound cleanser. o May shower with wound dressing protected with water repellent cover or cast protector. Edema Control - Lymphedema / Segmental Compressive Device /  Other o Elevate, Exercise Daily and Avoid Standing for Long Periods of Time. o Elevate legs to the level of the heart and pump ankles as often as possible o Elevate leg(s) parallel to the floor when sitting. Off-Loading Bilateral Lower Extremities o Open toe surgical shoe Wound Treatment Wound #1 - Foot Wound Laterality: Left, Lateral Cleanser: Normal Saline 3 x Per Week/30 Days Discharge Instructions: Wash your hands with soap and water. Remove old dressing, discard into plastic bag and place into trash. Cleanse the wound with Normal Saline prior to applying a clean dressing using gauze sponges, not tissues or cotton balls. Do not scrub or use excessive force. Pat dry using gauze sponges, not tissue or cotton balls. Primary Dressing: Silvercel Small 2x2 (in/in) 3 x Per Week/30 Days Discharge Instructions: Apply Silvercel Small 2x2 (in/in) as instructed Secondary Dressing: Bordered Gauze Sterile-HBD 4x4 (in/in) (Generic) 3 x Per Week/30 Days Discharge Instructions: Cover wound with Bordered Guaze Sterile as directed Electronic Signature(s) Signed: 01/01/2021 5:15:58 PM By: Phillis Haggis, Dondra Prader RN Signed: 01/02/2021 1:09:49 PM By: Lenda Kelp PA-C Entered By: Phillis Haggis, Dondra Prader on 01/01/2021 11:50:18 [name] [name] ([id number]) -------------------------------------------------------------------------------- Problem List Details Patient Name: [name], POOLER L. Date of Service:  01/01/2021 10:15 AM Medical Record Number: [id number] Patient Account Number: [id number] Date of Birth/Sex: November 07, 1934 (86 y.o. M) Treating RN: Rogers Blocker Primary Care Provider: Gorden Harms Other Clinician: Lolita Cram Referring Provider: Gorden Harms Treating Provider/Extender: Rowan Blase in Treatment: 11 Active Problems ICD-10 Encounter Code Description Active Date MDM Diagnosis E11.621 Type 2 diabetes mellitus with foot ulcer 10/10/2020 No Yes L97.522 Non-pressure chronic ulcer of other part of left foot with fat layer 10/10/2020 No Yes exposed N18.30 Chronic kidney disease, stage 3 unspecified 10/10/2020 No Yes I10 Essential (primary) hypertension 10/10/2020 No Yes I48.0 Paroxysmal atrial fibrillation 10/10/2020 No Yes Z79.01 Long term (current) use of anticoagulants 10/10/2020 No Yes Inactive Problems Resolved Problems Electronic Signature(s) Signed: 01/01/2021 11:50:07 AM By: Lenda Kelp PA-C Entered By: Lenda Kelp on 01/01/2021 11:50:07 [name] [name] ([id number]) -------------------------------------------------------------------------------- Progress Note Details Patient Name: [name] [name]. Date of Service: 01/01/2021 10:15 AM Medical Record Number: [id number] Patient Account Number: [id number] Date of Birth/Sex: 1935-08-17 (86 y.o. M) Treating RN: Rogers Blocker Primary Care Provider: Gorden Harms Other Clinician: Lolita Cram Referring Provider: Gorden Harms Treating Provider/Extender: Rowan Blase in Treatment: 11 Subjective Chief Complaint Information obtained from Patient Left foot ulcer History of Present Illness (HPI) 10/10/2020 upon evaluation today patient presents for initial evaluation in our clinic concerning a wound he has on the left lateral foot. 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 With that being said currently he has a wound portion of his left foot that is what we are seeing him for today everything else appears to actually be doing quite well which is great news. There does not appear to be any signs of active infection at this time which is also great news. He does have a history of diabetes mellitus type 2, chronic kidney disease stage III, hypertension, paroxysmal atrial fibrillation, and long-term use of anticoagulant therapy due to the atrial fibrillation. He does not have any pain secondary to neuropathy. His most recent hemoglobin A1c was 6.5 roughly a year ago as best we can tell. He does have [facility] coming out  currently to help take care of his wound. 10/24/2020 upon evaluation today patient appears to be doing well with regard to his wound. In fact I am very pleased with how things appear to be doing at this time. That is the least of his concerns at this time. Overall I am extremely pleased with the progress has been made. With that being said my biggest concern at this point he has actually not to do with his wound and other his blood pressure which is significantly elevated today. I am not certain that he has been taking his blood pressure upon further questioning with the patient and his family in fact I am almost certain that he is not. We did attempt to call his primary care provider but unfortunately we were unable to get them on the phone. The phone just rang. Nonetheless I think that he has an appointment with the cardiologist on Monday they can obviously recheck his blood pressure then. He also needs to get an appointment with his primary care provider likely to discuss his blood pressures. But also if he is not taking the medicine taking the medicine could obviously make a big difference in his blood pressure as it stands as well. 01/01/2021 upon evaluation today patient appears to be doing decently well in  regard to his foot ulcer. He is going require some sharp debridement is actually been quite sometime since have seen him I think the main issue here is that he unfortunately was having some issues with transportation and just not feeling well. Either way I think that we need to definitely get things back in order and seeing him on a regular basis. The patient voiced understanding. With that being said his wound is going require sharp debridement at this point. Objective Constitutional Well-nourished and well-hydrated in no acute distress. Vitals Time Taken: 11:04 AM, Height: 70 in, Weight: 151 lbs, BMI: 21.7, Temperature: 98.2 F, Pulse: 66 bpm, Respiratory Rate: 18 breaths/min, Blood Pressure: 180/81 mmHg. Respiratory normal breathing without difficulty. Psychiatric this patient is able to make decisions and demonstrates good insight into disease process. Alert and Oriented x 3. pleasant and cooperative. General Notes: Patient's wound bed showed signs of good granulation and epithelization at this point in some areas although there was callus buildup around the edges of the wound and others. I did perform sharp debridement to clear away the callus and necrotic tissue down to good subcutaneous tissue he tolerated that today without complication postdebridement wound bed appears to be doing much better. Integumentary (Hair, Skin) Wound #1 status is Open. Original cause of wound was Gradually Appeared. The date acquired was: 09/14/2019. The wound has been in treatment 11 weeks. The wound is located on the Left,Lateral Foot. The wound measures 0.2cm length x 0.8cm width x 0.4cm depth; 0.126cm^2 area and 0.05cm^3 volume. There is Fat Layer (Subcutaneous Tissue) exposed. There is undermining starting at 2:00 and ending at 4:00 with a maximum distance of 0.6cm. There is a medium amount of serosanguineous drainage noted. There is large (67-100%) red, pink granulation within the wound bed. There is a  small (1-33%) amount of necrotic tissue within the wound bed including Eschar and Adherent Slough. [name] RafterMITCHELL, Jacari L. ([id number]) Assessment Active Problems ICD-10 Type 2 diabetes mellitus with foot ulcer Non-pressure chronic ulcer of other part of left foot with fat layer exposed Chronic kidney disease, stage 3 unspecified Essential (primary) hypertension Paroxysmal atrial fibrillation Long term (current) use of anticoagulants Procedures Wound #1 Pre-procedure diagnosis of Wound #1  is a Diabetic Wound/Ulcer of the Lower Extremity located on the Left,Lateral Foot .Severity of Tissue Pre Debridement is: Fat layer exposed. There was a Excisional Skin/Subcutaneous Tissue Debridement with a total area of 4 sq cm performed by Nelida Meuse., PA-C. With the following instrument(s): Curette to remove Viable and Non-Viable tissue/material. Material removed includes Callus, Subcutaneous Tissue, and Slough. A time out was conducted at 11:44, prior to the start of the procedure. A Minimum amount of bleeding was controlled with Pressure. The procedure was tolerated well. Post Debridement Measurements: 1.2cm length x 1.6cm width x 0.4cm depth; 0.603cm^3 volume. Character of Wound/Ulcer Post Debridement is stable. Severity of Tissue Post Debridement is: Fat layer exposed. Post procedure Diagnosis Wound #1: Same as Pre-Procedure Plan Follow-up Appointments: Return Appointment in 2 weeks. Home Health: [facility] for wound care. May utilize formulary equivalent dressing for wound treatment orders unless otherwise specified. Home Health Nurse may visit PRN to address patient s wound care needs. - [facility] Bathing/ Shower/ Hygiene: Clean wound with Normal Saline or wound cleanser. May shower with wound dressing protected with water repellent cover or cast protector. Edema Control - Lymphedema / Segmental Compressive Device / Other: Elevate, Exercise Daily and Avoid Standing for Long Periods of  Time. Elevate legs to the level of the heart and pump ankles as often as possible Elevate leg(s) parallel to the floor when sitting. Off-Loading: Open toe surgical shoe WOUND #1: - Foot Wound Laterality: Left, Lateral Cleanser: Normal Saline 3 x Per Week/30 Days Discharge Instructions: Wash your hands with soap and water. Remove old dressing, discard into plastic bag and place into trash. Cleanse the wound with Normal Saline prior to applying a clean dressing using gauze sponges, not tissues or cotton balls. Do not scrub or use excessive force. Pat dry using gauze sponges, not tissue or cotton balls. Primary Dressing: Silvercel Small 2x2 (in/in) 3 x Per Week/30 Days Discharge Instructions: Apply Silvercel Small 2x2 (in/in) as instructed Secondary Dressing: Bordered Gauze Sterile-HBD 4x4 (in/in) (Generic) 3 x Per Week/30 Days Discharge Instructions: Cover wound with Bordered Guaze Sterile as directed 1. Would recommend that we go ahead and continue with the wound care measures as before with regard to the silver alginate dressing I think this is still a good option. 2. We will also continue with a border gauze dressing to cover which I think is appropriate. 3. Also needs to consider and make sure that he is keeping everything from building up as far as any pressure is concerned along this lateral portion of the foot. Obviously this is probably when he is laying down in bed more than anything. He voiced understanding. We will see patient back for reevaluation in 2 weeks here in the clinic. If anything worsens or changes patient will contact our office for additional recommendations. [name], [name] ([id number]) Electronic Signature(s) Signed: 01/01/2021 5:30:59 PM By: Lenda Kelp PA-C Entered By: Lenda Kelp on 01/01/2021 17:30:59 Scatena, [name] ([id number]) -------------------------------------------------------------------------------- SuperBill Details Patient Name:  [name] [name] Date of Service: 01/01/2021 Medical Record Number: [id number] Patient Account Number: [id number] Date of Birth/Sex: 1935-02-06 (86 y.o. M) Treating RN: Rogers Blocker Primary Care Provider: Gorden Harms Other Clinician: Lolita Cram Referring Provider: Gorden Harms Treating Provider/Extender: Rowan Blase in Treatment: 11 Diagnosis Coding ICD-10 Codes Code Description E11.621 Type 2 diabetes mellitus with foot ulcer L97.522 Non-pressure chronic ulcer of other part of left foot with fat layer exposed N18.30 Chronic kidney disease, stage 3 unspecified I10 Essential (primary)  hypertension I48.0 Paroxysmal atrial fibrillation Z79.01 Long term (current) use of anticoagulants Facility Procedures CPT4 Code: [id number] Description: 11042 - DEB SUBQ TISSUE 20 SQ CM/< Modifier: Quantity: 1 CPT4 Code: Description: ICD-10 Diagnosis Description L97.522 Non-pressure chronic ulcer of other part of left foot with fat layer exp Modifier: osed Quantity: Physician Procedures CPT4 Code: [id number] Description: 11042 - WC PHYS SUBQ TISS 20 SQ CM Modifier: Quantity: 1 CPT4 Code: Description: ICD-10 Diagnosis Description L97.522 Non-pressure chronic ulcer of other part of left foot with fat layer exp Modifier: osed Quantity: Electronic Signature(s) Signed: 01/01/2021 5:31:10 PM By: Lenda Kelp PA-C Previous Signature: 01/01/2021 5:15:58 PM Version By: Phillis Haggis, Dondra Prader RN Entered By: Lenda Kelp on 01/01/2021 17:31:09

## 2021-01-15 ENCOUNTER — Encounter: Payer: Medicare Other | Attending: Physician Assistant | Admitting: Physician Assistant

## 2021-01-15 ENCOUNTER — Other Ambulatory Visit: Payer: Self-pay

## 2021-01-15 DIAGNOSIS — L97522 Non-pressure chronic ulcer of other part of left foot with fat layer exposed: Secondary | ICD-10-CM | POA: Diagnosis not present

## 2021-01-15 DIAGNOSIS — N183 Chronic kidney disease, stage 3 unspecified: Secondary | ICD-10-CM | POA: Insufficient documentation

## 2021-01-15 DIAGNOSIS — E1122 Type 2 diabetes mellitus with diabetic chronic kidney disease: Secondary | ICD-10-CM | POA: Diagnosis not present

## 2021-01-15 DIAGNOSIS — I509 Heart failure, unspecified: Secondary | ICD-10-CM | POA: Diagnosis not present

## 2021-01-15 DIAGNOSIS — E1140 Type 2 diabetes mellitus with diabetic neuropathy, unspecified: Secondary | ICD-10-CM | POA: Insufficient documentation

## 2021-01-15 DIAGNOSIS — J449 Chronic obstructive pulmonary disease, unspecified: Secondary | ICD-10-CM | POA: Insufficient documentation

## 2021-01-15 DIAGNOSIS — I480 Paroxysmal atrial fibrillation: Secondary | ICD-10-CM | POA: Diagnosis not present

## 2021-01-15 DIAGNOSIS — E1151 Type 2 diabetes mellitus with diabetic peripheral angiopathy without gangrene: Secondary | ICD-10-CM | POA: Insufficient documentation

## 2021-01-15 DIAGNOSIS — E11621 Type 2 diabetes mellitus with foot ulcer: Secondary | ICD-10-CM | POA: Diagnosis not present

## 2021-01-15 DIAGNOSIS — I130 Hypertensive heart and chronic kidney disease with heart failure and stage 1 through stage 4 chronic kidney disease, or unspecified chronic kidney disease: Secondary | ICD-10-CM | POA: Insufficient documentation

## 2021-01-15 DIAGNOSIS — Z7901 Long term (current) use of anticoagulants: Secondary | ICD-10-CM | POA: Diagnosis not present

## 2021-01-15 NOTE — Progress Notes (Addendum)
ORVIE, CARADINE ([id number]) Visit Report for 01/15/2021 Chief Complaint Document Details Patient Name: [name] [name], [name] [name]. Date of Service: 01/15/2021 11:00 AM Medical Record Number: [id number] Patient Account Number: [id number] Date of Birth/Sex: 03-08-1935 (86 y.o. M) Treating RN: Rogers Blocker Primary Care Provider: Gorden Harms Other Clinician: Lolita Cram Referring Provider: Gorden Harms Treating Provider/Extender: Rowan Blase in Treatment: 13 Information Obtained from: Patient Chief Complaint Left foot ulcer Electronic Signature(s) Signed: 01/15/2021 10:58:36 AM By: Lenda Kelp PA-C Entered By: Lenda Kelp on 01/15/2021 10:58:36 [name] [name] ([id number]) -------------------------------------------------------------------------------- Debridement Details Patient Name: [name] [name] Date of Service: 01/15/2021 11:00 AM Medical Record Number: [id number] Patient Account Number: [id number] Date of Birth/Sex: November 11, 1934 (86 y.o. M) Treating RN: Rogers Blocker Primary Care Provider: Gorden Harms Other Clinician: Lolita Cram Referring Provider: Gorden Harms Treating Provider/Extender: Rowan Blase in Treatment: 13 Debridement Performed for Wound #1 Left,Lateral Foot Assessment: Performed By: Physician Nelida Meuse., PA-C Debridement Type: Debridement Severity of Tissue Pre Debridement: Fat layer exposed Level of Consciousness (Pre- Awake and Alert procedure): Pre-procedure Verification/Time Out Yes - 11:57 Taken: Start Time: 11:57 Total Area Debrided (L x W): 0.3 (cm) x 0.7 (cm) = 0.21 (cm) Tissue and other material Viable, Non-Viable, Callus, Slough, Subcutaneous, Slough debrided: Level: Skin/Subcutaneous Tissue Debridement Description: Excisional Instrument: Curette Bleeding: Minimum Hemostasis Achieved: Pressure Response to Treatment: Procedure was tolerated well Level of Consciousness (Post- Awake and  Alert procedure): Post Debridement Measurements of Total Wound Length: (cm) 0.5 Width: (cm) 1 Depth: (cm) 0.1 Volume: (cm) 0.039 Character of Wound/Ulcer Post Debridement: Stable Severity of Tissue Post Debridement: Fat layer exposed Post Procedure Diagnosis Same as Pre-procedure Electronic Signature(s) Signed: 01/15/2021 1:30:25 PM By: Lenda Kelp PA-C Signed: 01/15/2021 5:11:22 PM By: Phillis Haggis, Dondra Prader RN Entered By: Phillis Haggis, Dondra Prader on 01/15/2021 12:01:15 [name] [name] ([id number]) -------------------------------------------------------------------------------- Debridement Details Patient Name: [name] [name]. Date of Service: 01/15/2021 11:00 AM Medical Record Number: [id number] Patient Account Number: [id number] Date of Birth/Sex: 05-Sep-1935 (86 y.o. M) Treating RN: Rogers Blocker Primary Care Provider: Gorden Harms Other Clinician: Lolita Cram Referring Provider: Gorden Harms Treating Provider/Extender: Rowan Blase in Treatment: 13 Debridement Performed for Wound #2 Left Toe Second Assessment: Performed By: Physician Nelida Meuse., PA-C Debridement Type: Debridement Severity of Tissue Pre Debridement: Fat layer exposed Level of Consciousness (Pre- Awake and Alert procedure): Pre-procedure Verification/Time Out Yes - 11:57 Taken: Start Time: 11:57 Total Area Debrided (L x W): 1 (cm) x 1 (cm) = 1 (cm) Tissue and other material Viable, Non-Viable, Slough, Subcutaneous, Slough debrided: Level: Skin/Subcutaneous Tissue Debridement Description: Excisional Instrument: Curette Bleeding: Minimum Hemostasis Achieved: Pressure Response to Treatment: Procedure was tolerated well Level of Consciousness (Post- Awake and Alert procedure): Post Debridement Measurements of Total Wound Length: (cm) 1 Width: (cm) 1 Depth: (cm) 0.2 Volume: (cm) 0.157 Character of Wound/Ulcer Post Debridement: Stable Severity of Tissue Post Debridement:  Fat layer exposed Post Procedure Diagnosis Same as Pre-procedure Electronic Signature(s) Signed: 01/15/2021 1:30:25 PM By: Lenda Kelp PA-C Signed: 01/15/2021 5:11:22 PM By: Phillis Haggis, Dondra Prader RN Entered By: Phillis Haggis, Dondra Prader on 01/15/2021 12:01:40 [name] [name] ([id number]) -------------------------------------------------------------------------------- HPI Details Patient Name: [name] [name]. Date of Service: 01/15/2021 11:00 AM Medical Record Number: [id number] Patient Account Number: [id number] Date of Birth/Sex: Mar 09, 1935 (86 y.o. M) Treating RN: Rogers Blocker Primary Care Provider: Gorden Harms Other Clinician: Lolita Cram Referring Provider: Gorden Harms Treating Provider/Extender: Rowan Blase in Treatment: 13 History of Present Illness HPI Description: 10/10/2020  upon evaluation today patient presents for initial evaluation in our clinic concerning a wound he has on the left lateral foot. 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 With that being said currently he has a wound portion of his left foot that is what we are seeing him for today everything else appears to actually be doing quite well which is great news. There does not appear to be any signs of active infection at this time which is also great news. He does have a history of diabetes mellitus type 2, chronic kidney disease stage III, hypertension, paroxysmal atrial fibrillation, and long-term use of anticoagulant therapy due to the atrial fibrillation. He does not have any pain secondary to neuropathy. His most recent hemoglobin A1c was 6.5 roughly a year ago as best we can tell. He does have [facility] coming out currently to help take care of his wound. 10/24/2020 upon evaluation today patient appears to be doing well with  regard to his wound. In fact I am very pleased with how things appear to be doing at this time. That is the least of his concerns at this time. Overall I am extremely pleased with the progress has been made. With that being said my biggest concern at this point he has actually not to do with his wound and other his blood pressure which is significantly elevated today. I am not certain that he has been taking his blood pressure upon further questioning with the patient and his family in fact I am almost certain that he is not. We did attempt to call his primary care provider but unfortunately we were unable to get them on the phone. The phone just rang. Nonetheless I think that he has an appointment with the cardiologist on Monday they can obviously recheck his blood pressure then. He also needs to get an appointment with his primary care provider likely to discuss his blood pressures. But also if he is not taking the medicine taking the medicine could obviously make a big difference in his blood pressure as it stands as well. 01/01/2021 upon evaluation today patient appears to be doing decently well in regard to his foot ulcer. He is going require some sharp debridement is actually been quite sometime since have seen him I think the main issue here is that he unfortunately was having some issues with transportation and just not feeling well. Either way I think that we need to definitely get things back in order and seeing him on a regular basis. The patient voiced understanding. With that being said his wound is going require sharp debridement at this point. 01/15/2021 upon evaluation today patient appears to be doing well with regard to his lateral foot ulcer. Unfortunately he has a new wound on his left second toe which appears to be something that rubbed. He tells me that this is something that happened as a result of him clipping his toenails. Again I am not exactly sure how that would have occurred  however. Nonetheless I think this is a significant issue here that is going to require some potential intervention. Electronic Signature(s) Signed: 01/15/2021 1:15:14 PM By: Lenda Kelp PA-C Entered By: Lenda Kelp on 01/15/2021 13:15:14 [name] [name] ([id number]) -------------------------------------------------------------------------------- Physical Exam Details Patient Name: NIKLAS, CHRETIEN. Date of Service: 01/15/2021 11:00 AM  Medical Record Number: [id number] Patient Account Number: [id number] Date of Birth/Sex: [birth year] (85 y.o. M) Treating RN: Rogers Blocker Primary Care Provider: Gorden Harms Other Clinician: Lolita Cram Referring Provider: Gorden Harms Treating Provider/Extender: Rowan Blase in Treatment: 13 Constitutional Well-nourished and well-hydrated in no acute distress. Respiratory normal breathing without difficulty. Psychiatric this patient is able to make decisions and demonstrates good insight into disease process. Alert and Oriented x 3. pleasant and cooperative. Notes Upon inspection patient's wounds did require sharp debridement at both locations. He actually tolerated the debridement today without complication and postdebridement the wound bed appears to be doing much better which is great news. There does not appear to be any signs of infection which is also great news. Overall I think that we are headed in the right track but nonetheless he is going to have to make sure that he takes care of things on his own as well. His dressing was extremely dirty and saturated and I do not think this was just from drainage. Nonetheless I think that is of utmost importance if he is going to see this improve. Electronic Signature(s) Signed: 01/15/2021 1:15:59 PM By: Lenda Kelp PA-C Entered By: Lenda Kelp on 01/15/2021 13:15:58 [name] [name]  ([id number]) -------------------------------------------------------------------------------- Physician Orders Details Patient Name: WORLEY, RADERMACHER. Date of Service: 01/15/2021 11:00 AM Medical Record Number: [id number] Patient Account Number: [id number] Date of Birth/Sex: 02/06/1935 (86 y.o. M) Treating RN: Rogers Blocker Primary Care Provider: Gorden Harms Other Clinician: Lolita Cram Referring Provider: Gorden Harms Treating Provider/Extender: Rowan Blase in Treatment: 13 Verbal / Phone Orders: No Diagnosis Coding ICD-10 Coding Code Description E11.621 Type 2 diabetes mellitus with foot ulcer L97.522 Non-pressure chronic ulcer of other part of left foot with fat layer exposed N18.30 Chronic kidney disease, stage 3 unspecified I10 Essential (primary) hypertension I48.0 Paroxysmal atrial fibrillation Z79.01 Long term (current) use of anticoagulants Follow-up Appointments o Return Appointment in 1 week. Home Health o [facility] for wound care. May utilize formulary equivalent dressing for wound treatment orders unless otherwise specified. Home Health Nurse may visit PRN to address patientos wound care needs. - [facility] Bathing/ Shower/ Hygiene o Clean wound with Normal Saline or wound cleanser. o May shower with wound dressing protected with water repellent cover or cast protector. Edema Control - Lymphedema / Segmental Compressive Device / Other o Elevate, Exercise Daily and Avoid Standing for Long Periods of Time. o Elevate legs to the level of the heart and pump ankles as often as possible o Elevate leg(s) parallel to the floor when sitting. Off-Loading Bilateral Lower Extremities o Open toe surgical shoe Wound Treatment Wound #1 - Foot Wound Laterality: Left, Lateral Cleanser: Normal Saline 3 x Per Week/30 Days Discharge Instructions: Wash your hands with soap and water. Remove old dressing, discard into plastic bag and place into  trash. Cleanse the wound with Normal Saline prior to applying a clean dressing using gauze sponges, not tissues or cotton balls. Do not scrub or use excessive force. Pat dry using gauze sponges, not tissue or cotton balls. Primary Dressing: Silvercel Small 2x2 (in/in) 3 x Per Week/30 Days Discharge Instructions: Apply Silvercel Small 2x2 (in/in) as instructed Secondary Dressing: Bordered Gauze Sterile-HBD 4x4 (in/in) (Generic) 3 x Per Week/30 Days Discharge Instructions: Cover wound with Bordered Guaze Sterile as directed Wound #2 - Toe Second Wound Laterality: Left Cleanser: Normal Saline 3 x Per Week/30 Days Discharge Instructions: Wash your hands with soap and water. Remove old dressing, discard into plastic  bag and place into trash. Cleanse the wound with Normal Saline prior to applying a clean dressing using gauze sponges, not tissues or cotton balls. Do not scrub or use excessive force. Pat dry using gauze sponges, not tissue or cotton balls. Primary Dressing: Silvercel Small 2x2 (in/in) 3 x Per Week/30 Days Discharge Instructions: Apply Silvercel Small 2x2 (in/in) as instructed Secondary Dressing: Telfa Adhesive [place] Dressing, 4x4 (in/in) 3 x Per Week/30 Days Discharge Instructions: Apply over dressing to secure in place. BENJERMIN, KORBER ([id number]) Electronic Signature(s) Signed: 01/15/2021 1:30:25 PM By: Lenda Kelp PA-C Signed: 01/15/2021 5:11:22 PM By: Phillis Haggis, Dondra Prader RN Entered By: Phillis Haggis, Dondra Prader on 01/15/2021 12:02:31 [name] [name] ([id number]) -------------------------------------------------------------------------------- Problem List Details Patient Name: LANCELOT, ALYEA. Date of Service: 01/15/2021 11:00 AM Medical Record Number: [id number] Patient Account Number: [id number] Date of Birth/Sex: 1935/03/11 (86 y.o. M) Treating RN: Rogers Blocker Primary Care Provider: Gorden Harms Other Clinician: Lolita Cram Referring Provider: Gorden Harms Treating Provider/Extender: Rowan Blase in Treatment: 13 Active Problems ICD-10 Encounter Code Description Active Date MDM Diagnosis E11.621 Type 2 diabetes mellitus with foot ulcer 10/10/2020 No Yes L97.522 Non-pressure chronic ulcer of other part of left foot with fat layer 10/10/2020 No Yes exposed N18.30 Chronic kidney disease, stage 3 unspecified 10/10/2020 No Yes I10 Essential (primary) hypertension 10/10/2020 No Yes I48.0 Paroxysmal atrial fibrillation 10/10/2020 No Yes Z79.01 Long term (current) use of anticoagulants 10/10/2020 No Yes Inactive Problems Resolved Problems Electronic Signature(s) Signed: 01/15/2021 10:58:31 AM By: Lenda Kelp PA-C Entered By: Lenda Kelp on 01/15/2021 10:58:31 [name] [name] ([id number]) -------------------------------------------------------------------------------- Progress Note Details Patient Name: [name] [name]. Date of Service: 01/15/2021 11:00 AM Medical Record Number: [id number] Patient Account Number: [id number] Date of Birth/Sex: 07-Apr-1935 (86 y.o. M) Treating RN: Rogers Blocker Primary Care Provider: Gorden Harms Other Clinician: Lolita Cram Referring Provider: Gorden Harms Treating Provider/Extender: Rowan Blase in Treatment: 13 Subjective Chief Complaint Information obtained from Patient Left foot ulcer History of Present Illness (HPI) 10/10/2020 upon evaluation today patient presents for initial evaluation in our clinic concerning a wound he has on the left lateral foot. 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 With that being said currently he has a wound portion of his left foot that is what we are seeing him for today everything else appears to actually be doing quite well which is great news. There does not appear to be any  signs of active infection at this time which is also great news. He does have a history of diabetes mellitus type 2, chronic kidney disease stage III, hypertension, paroxysmal atrial fibrillation, and long-term use of anticoagulant therapy due to the atrial fibrillation. He does not have any pain secondary to neuropathy. His most recent hemoglobin A1c was 6.5 roughly a year ago as best we can tell. He does have [facility] coming out currently to help take care of his wound. 10/24/2020 upon evaluation today patient appears to be doing well with regard to his wound. In fact I am very pleased with how things appear to be doing at this time. That is the least of his concerns at this time. Overall I am extremely pleased with the progress has been made. With that being said my biggest concern at this point he has actually not to do with  his wound and other his blood pressure which is significantly elevated today. I am not certain that he has been taking his blood pressure upon further questioning with the patient and his family in fact I am almost certain that he is not. We did attempt to call his primary care provider but unfortunately we were unable to get them on the phone. The phone just rang. Nonetheless I think that he has an appointment with the cardiologist on Monday they can obviously recheck his blood pressure then. He also needs to get an appointment with his primary care provider likely to discuss his blood pressures. But also if he is not taking the medicine taking the medicine could obviously make a big difference in his blood pressure as it stands as well. 01/01/2021 upon evaluation today patient appears to be doing decently well in regard to his foot ulcer. He is going require some sharp debridement is actually been quite sometime since have seen him I think the main issue here is that he unfortunately was having some issues with transportation and just not feeling well. Either way I think that  we need to definitely get things back in order and seeing him on a regular basis. The patient voiced understanding. With that being said his wound is going require sharp debridement at this point. 01/15/2021 upon evaluation today patient appears to be doing well with regard to his lateral foot ulcer. Unfortunately he has a new wound on his left second toe which appears to be something that rubbed. He tells me that this is something that happened as a result of him clipping his toenails. Again I am not exactly sure how that would have occurred however. Nonetheless I think this is a significant issue here that is going to require some potential intervention. Objective Constitutional Well-nourished and well-hydrated in no acute distress. Vitals Time Taken: 11:25 AM, Height: 70 in, Weight: 151 lbs, BMI: 21.7, Temperature: 98.3 F, Pulse: 55 bpm, Respiratory Rate: 16 breaths/min, Blood Pressure: 169/65 mmHg. Respiratory normal breathing without difficulty. Psychiatric this patient is able to make decisions and demonstrates good insight into disease process. Alert and Oriented x 3. pleasant and cooperative. General Notes: Upon inspection patient's wounds did require sharp debridement at both locations. He actually tolerated the debridement today without complication and postdebridement the wound bed appears to be doing much better which is great news. There does not appear to be any signs of infection which is also great news. Overall I think that we are headed in the right track but nonetheless he is going to have to make sure that he takes care of things on his own as well. His dressing was extremely dirty and saturated and I do not think this was just from drainage. Nonetheless I think that is of utmost importance if he is going to see this improve. GRAYTON, LOBO ([id number]) Integumentary (Hair, Skin) Wound #1 status is Open. Original cause of wound was Gradually Appeared. The date acquired  was: 09/14/2019. The wound has been in treatment 13 weeks. The wound is located on the Left,Lateral Foot. The wound measures 0.3cm length x 0.7cm width x 0.1cm depth; 0.165cm^2 area and 0.016cm^3 volume. There is Fat Layer (Subcutaneous Tissue) exposed. There is no tunneling or undermining noted. There is a medium amount of serosanguineous drainage noted. There is large (67-100%) red, pink granulation within the wound bed. There is a small (1-33%) amount of necrotic tissue within the wound bed including Adherent Slough. Wound #2 status is Open.  Original cause of wound was Gradually Appeared. The date acquired was: 01/12/2021. The wound is located on the Left Toe Second. The wound measures 1cm length x 1cm width x 0.1cm depth; 0.785cm^2 area and 0.079cm^3 volume. There is no tunneling or undermining noted. There is a medium amount of serosanguineous drainage noted. There is no granulation within the wound bed. There is no necrotic tissue within the wound bed. Assessment Active Problems ICD-10 Type 2 diabetes mellitus with foot ulcer Non-pressure chronic ulcer of other part of left foot with fat layer exposed Chronic kidney disease, stage 3 unspecified Essential (primary) hypertension Paroxysmal atrial fibrillation Long term (current) use of anticoagulants Procedures Wound #1 Pre-procedure diagnosis of Wound #1 is a Diabetic Wound/Ulcer of the Lower Extremity located on the Left,Lateral Foot .Severity of Tissue Pre Debridement is: Fat layer exposed. There was a Excisional Skin/Subcutaneous Tissue Debridement with a total area of 0.21 sq cm performed by Nelida Meuse., PA-C. With the following instrument(s): Curette to remove Viable and Non-Viable tissue/material. Material removed includes Callus, Subcutaneous Tissue, and Slough. A time out was conducted at 11:57, prior to the start of the procedure. A Minimum amount of bleeding was controlled with Pressure. The procedure was tolerated well. Post  Debridement Measurements: 0.5cm length x 1cm width x 0.1cm depth; 0.039cm^3 volume. Character of Wound/Ulcer Post Debridement is stable. Severity of Tissue Post Debridement is: Fat layer exposed. Post procedure Diagnosis Wound #1: Same as Pre-Procedure Wound #2 Pre-procedure diagnosis of Wound #2 is a Diabetic Wound/Ulcer of the Lower Extremity located on the Left Toe Second .Severity of Tissue Pre Debridement is: Fat layer exposed. There was a Excisional Skin/Subcutaneous Tissue Debridement with a total area of 1 sq cm performed by Nelida Meuse., PA-C. With the following instrument(s): Curette to remove Viable and Non-Viable tissue/material. Material removed includes Subcutaneous Tissue and Slough and. A time out was conducted at 11:57, prior to the start of the procedure. A Minimum amount of bleeding was controlled with Pressure. The procedure was tolerated well. Post Debridement Measurements: 1cm length x 1cm width x 0.2cm depth; 0.157cm^3 volume. Character of Wound/Ulcer Post Debridement is stable. Severity of Tissue Post Debridement is: Fat layer exposed. Post procedure Diagnosis Wound #2: Same as Pre-Procedure Plan Follow-up Appointments: Return Appointment in 1 week. Home Health: [facility] for wound care. May utilize formulary equivalent dressing for wound treatment orders unless otherwise specified. Home Health Nurse may visit PRN to address patient s wound care needs. - [facility] Bathing/ Shower/ Hygiene: Clean wound with Normal Saline or wound cleanser. May shower with wound dressing protected with water repellent cover or cast protector. Edema Control - Lymphedema / Segmental Compressive Device / Other: Elevate, Exercise Daily and Avoid Standing for Long Periods of Time. Elevate legs to the level of the heart and pump ankles as often as possible Elevate leg(s) parallel to the floor when sitting. Off-Loading: GLENFORD, CANTERA ([id number]) Open toe surgical  shoe WOUND #1: - Foot Wound Laterality: Left, Lateral Cleanser: Normal Saline 3 x Per Week/30 Days Discharge Instructions: Wash your hands with soap and water. Remove old dressing, discard into plastic bag and place into trash. Cleanse the wound with Normal Saline prior to applying a clean dressing using gauze sponges, not tissues or cotton balls. Do not scrub or use excessive force. Pat dry using gauze sponges, not tissue or cotton balls. Primary Dressing: Silvercel Small 2x2 (in/in) 3 x Per Week/30 Days Discharge Instructions: Apply Silvercel Small 2x2 (in/in) as instructed Secondary Dressing: Bordered  Gauze Sterile-HBD 4x4 (in/in) (Generic) 3 x Per Week/30 Days Discharge Instructions: Cover wound with Bordered Guaze Sterile as directed WOUND #2: - Toe Second Wound Laterality: Left Cleanser: Normal Saline 3 x Per Week/30 Days Discharge Instructions: Wash your hands with soap and water. Remove old dressing, discard into plastic bag and place into trash. Cleanse the wound with Normal Saline prior to applying a clean dressing using gauze sponges, not tissues or cotton balls. Do not scrub or use excessive force. Pat dry using gauze sponges, not tissue or cotton balls. Primary Dressing: Silvercel Small 2x2 (in/in) 3 x Per Week/30 Days Discharge Instructions: Apply Silvercel Small 2x2 (in/in) as instructed Secondary Dressing: Telfa Adhesive Island Dressing, 4x4 (in/in) 3 x Per Week/30 Days Discharge Instructions: Apply over dressing to secure in place. 1. Would recommend currently that we going to continue with the silver alginate dressing I think that is a good option for both locations both on the new wound on the toe as well as the left lateral foot. 2. I am also can recommend a Telfa island dressing for the foot. We will use a small Band-Aid or something similar for the toe. 3. I am also can I suggest that we use a postop shoe to try to keep pressure off of his foot I think this is still of  utmost importance. We will see patient back for reevaluation in 1 week here in the clinic. If anything worsens or changes patient will contact our office for additional recommendations. Electronic Signature(s) Signed: 01/15/2021 1:16:31 PM By: Lenda KelpStone III, Nissa Stannard PA-C Entered By: Lenda KelpStone III, Darryon Bastin on 01/15/2021 13:16:31 Vondra, Pryor OchoaWALTER L. ([id number]) -------------------------------------------------------------------------------- SuperBill Details Patient Name: [name] RafterMITCHELL, Arion L. Date of Service: 01/15/2021 Medical Record Number: [id number] Patient Account Number: [id number] Date of Birth/Sex: 07/03/1935 (86 y.o. 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 N18.30 Chronic kidney disease, stage 3 unspecified I10 Essential (primary) hypertension I48.0 Paroxysmal atrial fibrillation Z79.01 Long term (current) use of anticoagulants Facility Procedures CPT4 Code: 6295284136100012 Description: 11042 - DEB SUBQ TISSUE 20 SQ CM/< Modifier: Quantity: 1 CPT4 Code: Description: ICD-10 Diagnosis Description L97.522 Non-pressure chronic ulcer of other part of left foot with fat layer exp Modifier: osed Quantity: Physician Procedures CPT4 Code: 32440106770168 Description: 11042 - WC PHYS SUBQ TISS 20 SQ CM Modifier: Quantity: 1 CPT4 Code: Description: ICD-10 Diagnosis Description L97.522 Non-pressure chronic ulcer of other part of left foot with fat layer exp Modifier: osed Quantity: Electronic Signature(s) Signed: 01/15/2021 1:17:37 PM By: Lenda KelpStone III, Sheranda Seabrooks PA-C Entered By: Lenda KelpStone III, Gay Rape on 01/15/2021 13:17:36

## 2021-01-15 NOTE — Progress Notes (Addendum)
MANDY, FITZWATER ([id number]) Visit Report for 01/15/2021 Arrival Information Details Patient Name: [name] [name], [name] [name]. Date of Service: 01/15/2021 11:00 AM Medical Record Number: [id number] Patient Account Number: [id number] Date of Birth/Sex: May 14, 1935 (86 y.o. M) Treating RN: Carlene Coria Primary Care Sueko Dimichele: Crissie Figures Other Clinician: Jeanine Luz Referring Braydan Marriott: Crissie Figures Treating Sherlyn Ebbert/Extender: Skipper Cliche in Treatment: 36 Visit Information History Since Last Visit All ordered tests and consults were completed: No Patient Arrived: Ambulatory Added or deleted any medications: No Arrival Time: 11:25 Any new allergies or adverse reactions: No Accompanied By: self Had a fall or experienced change in No Transfer Assistance: None activities of daily living that may affect Patient Identification Verified: Yes risk of falls: Secondary Verification Process Completed: Yes Signs or symptoms of abuse/neglect since last visito No Patient Requires Transmission-Based No Hospitalized since last visit: No Precautions: Implantable device outside of the clinic excluding No Patient Has Alerts: Yes cellular tissue based products placed in the center Patient Alerts: Patient on Blood since last visit: Thinner Has Dressing in Place as Prescribed: Yes NON COMPRESSABLE Pain Present Now: No Electronic Signature(s) Signed: 01/26/2021 8:33:59 AM By: Carlene Coria RN Entered By: Carlene Coria on 01/15/2021 11:25:22 [name] [name] ([id number]) -------------------------------------------------------------------------------- Clinic Level of Care Assessment Details Patient Name: [name] [name]. Date of Service: 01/15/2021 11:00 AM Medical Record Number: [id number] Patient Account Number: [id number] Date of Birth/Sex: 06/02/1935 (86 y.o. M) Treating RN: Dolan Amen Primary Care Selah Zelman: Crissie Figures Other Clinician: Jeanine Luz Referring Elaine Roanhorse: Crissie Figures Treating Donal Lynam/Extender: Skipper Cliche in Treatment: 13 Clinic Level of Care Assessment Items TOOL 1 Quantity Score []  - Use when EandM and Procedure is performed on INITIAL visit 0 ASSESSMENTS - Nursing Assessment / Reassessment []  - General Physical Exam (combine w/ comprehensive assessment (listed just below) when performed on new 0 pt. evals) []  - 0 Comprehensive Assessment (HX, ROS, Risk Assessments, Wounds Hx, etc.) ASSESSMENTS - Wound and Skin Assessment / Reassessment []  - Dermatologic / Skin Assessment (not related to wound area) 0 ASSESSMENTS - Ostomy and/or Continence Assessment and Care []  - Incontinence Assessment and Management 0 []  - 0 Ostomy Care Assessment and Management (repouching, etc.) PROCESS - Coordination of Care []  - Simple Patient / Family Education for ongoing care 0 []  - 0 Complex (extensive) Patient / Family Education for ongoing care []  - 0 Staff obtains Programmer, systems, Records, Test Results / Process Orders []  - 0 Staff telephones HHA, Nursing Homes / Clarify orders / etc []  - 0 Routine Transfer to another Facility (non-emergent condition) []  - 0 Routine Hospital Admission (non-emergent condition) []  - 0 New Admissions / Biomedical engineer / Ordering NPWT, Apligraf, etc. []  - 0 Emergency Hospital Admission (emergent condition) PROCESS - Special Needs []  - Pediatric / Minor Patient Management 0 []  - 0 Isolation Patient Management []  - 0 Hearing / Language / Visual special needs []  - 0 Assessment of Community assistance (transportation, D/C planning, etc.) []  - 0 Additional assistance / Altered mentation []  - 0 Support Surface(s) Assessment (bed, cushion, seat, etc.) INTERVENTIONS - Miscellaneous []  - External ear exam 0 []  - 0 Patient Transfer (multiple staff / Civil Service fast streamer / Similar devices) []  - 0 Simple Staple / Suture removal (25 or less) []  - 0 Complex Staple / Suture removal (26 or more) []  - 0 Hypo/Hyperglycemic  Management (do not check if billed separately) []  - 0 Ankle / Brachial Index (ABI) - do not check if billed separately Has the patient been seen at  the hospital within the last three years: Yes Total Score: 0 Level Of Care: ____ [name] [name] ([id number]) Electronic Signature(s) Signed: 01/15/2021 5:11:22 PM By: Georges Mouse, Minus Breeding RN Entered By: Georges Mouse, Minus Breeding on 01/15/2021 12:02:36 [name] [name] ([id number]) -------------------------------------------------------------------------------- Encounter Discharge Information Details Patient Name: [name] [name], [name] [name]. Date of Service: 01/15/2021 11:00 AM Medical Record Number: [id number] Patient Account Number: [id number] Date of Birth/Sex: [birth year] (86 y.o. M) Treating RN: Carlene Coria Primary Care Travaughn Vue: Crissie Figures Other Clinician: Jeanine Luz Referring Lylliana Kitamura: Crissie Figures Treating Lynn Sissel/Extender: Skipper Cliche in Treatment: 13 Encounter Discharge Information Items Post Procedure Vitals Discharge Condition: Stable Temperature (F): 98.3 Ambulatory Status: Cane Pulse (bpm): 55 Discharge Destination: Home Respiratory Rate (breaths/min): 16 Transportation: Private Auto Blood Pressure (mmHg): 169/65 Accompanied By: self Schedule Follow-up Appointment: Yes Clinical Summary of Care: Electronic Signature(s) Signed: 01/26/2021 8:33:59 AM By: Carlene Coria RN Entered By: Carlene Coria on 01/15/2021 12:11:02 [name] [name] ([id number]) -------------------------------------------------------------------------------- Lower Extremity Assessment Details Patient Name: [name] [name]. Date of Service: 01/15/2021 11:00 AM Medical Record Number: [id number] Patient Account Number: [id number] Date of Birth/Sex: April 23, 1935 (86 y.o. M) Treating RN: Carlene Coria Primary Care Yareli Carthen: Crissie Figures Other Clinician: Jeanine Luz Referring Ruble Buttler: Crissie Figures Treating Katheryn Culliton/Extender: Skipper Cliche in Treatment: 13 Vascular Assessment Pulses: Dorsalis Pedis Palpable: [Left:Yes] Electronic Signature(s) Signed: 01/26/2021 8:33:59 AM By: Carlene Coria RN Entered By: Carlene Coria on 01/15/2021 11:37:52 [name] [name], [name] [name] ([id number]) -------------------------------------------------------------------------------- Multi Wound Chart Details Patient Name: [name] [name]. Date of Service: 01/15/2021 11:00 AM Medical Record Number: [id number] Patient Account Number: [id number] Date of Birth/Sex: [birth year] (86 y.o. M) Treating RN: Dolan Amen Primary Care Marlei Glomski: Crissie Figures Other Clinician: Jeanine Luz Referring Rozlyn Yerby: Crissie Figures Treating Zahlia Deshazer/Extender: Skipper Cliche in Treatment: 13 Vital Signs Height(in): 70 Pulse(bpm): 61 Weight(lbs): 151 Blood Pressure(mmHg): 169/65 Body Mass Index(BMI): 22 Temperature(F): 98.3 Respiratory Rate(breaths/min): 16 Photos: [N/A:N/A] Wound Location: Left, Lateral Foot Left Toe Second N/A Wounding Event: Gradually Appeared Gradually Appeared N/A Primary Etiology: Diabetic Wound/Ulcer of the Lower Diabetic Wound/Ulcer of the Lower N/A Extremity Extremity Comorbid History: Chronic Obstructive Pulmonary Chronic Obstructive Pulmonary N/A Disease (COPD), Congestive Heart Disease (COPD), Congestive Heart Failure, Hypertension, Myocardial Failure, Hypertension, Myocardial Infarction, Type II Diabetes Infarction, Type II Diabetes Date Acquired: 09/14/2019 01/12/2021 N/A Weeks of Treatment: 13 0 N/A Wound Status: Open Open N/A Measurements L x W x D (cm) 0.3x0.7x0.1 1x1x0.1 N/A Area (cm) : 0.165 0.785 N/A Volume (cm) : 0.016 0.079 N/A % Reduction in Area: 95.00% N/A N/A % Reduction in Volume: 97.60% N/A N/A Classification: Grade 2 Grade 2 N/A Exudate Amount: Medium Medium N/A Exudate Type: Serosanguineous Serosanguineous N/A Exudate Color: red, brown red, brown N/A Granulation Amount: Large (67-100%) None  Present (0%) N/A Granulation Quality: Red, Pink N/A N/A Necrotic Amount: Small (1-33%) None Present (0%) N/A Exposed Structures: Fat Layer (Subcutaneous Tissue): Fascia: No N/A Yes Fat Layer (Subcutaneous Tissue): Fascia: No No Tendon: No Tendon: No Muscle: No Muscle: No Joint: No Joint: No Bone: No Bone: No Epithelialization: None None N/A Treatment Notes Electronic Signature(s) Signed: 01/15/2021 5:11:22 PM By: Georges Mouse, Minus Breeding RN Entered By: Georges Mouse, Minus Breeding on 01/15/2021 11:57:14 [name] [name] ([id number]) -------------------------------------------------------------------------------- [place] Details Patient Name: [name] [name], [name] [name]. Date of Service: 01/15/2021 11:00 AM Medical Record Number: [id number] Patient Account Number: [id number] Date of Birth/Sex: 1934-11-26 (86 y.o. M) Treating RN: Dolan Amen Primary Care Caileigh Canche: Crissie Figures Other Clinician: Jeanine Luz Referring Anetria Harwick: Crissie Figures Treating Aragon Scarantino/Extender: Jeri Cos  Weeks in Treatment: 13 Active Inactive Wound/Skin Impairment Nursing Diagnoses: Knowledge deficit related to ulceration/compromised skin integrity Goals: Patient/caregiver will verbalize understanding of skin care regimen Date Initiated: 10/10/2020 Date Inactivated: 01/01/2021 Target Resolution Date: 11/10/2020 Goal Status: Met Ulcer/skin breakdown will have a volume reduction of 30% by week 4 Date Initiated: 10/10/2020 Target Resolution Date: 11/10/2020 Goal Status: Active Ulcer/skin breakdown will have a volume reduction of 50% by week 8 Date Initiated: 10/10/2020 Target Resolution Date: 12/08/2020 Goal Status: Active Ulcer/skin breakdown will have a volume reduction of 80% by week 12 Date Initiated: 10/10/2020 Target Resolution Date: 01/08/2021 Goal Status: Active Ulcer/skin breakdown will heal within 14 weeks Date Initiated: 10/10/2020 Target Resolution Date: 02/07/2021 Goal Status:  Active Interventions: Assess patient/caregiver ability to obtain necessary supplies Assess patient/caregiver ability to perform ulcer/skin care regimen upon admission and as needed Assess ulceration(s) every visit Notes: Electronic Signature(s) Signed: 01/15/2021 5:11:22 PM By: Georges Mouse, Minus Breeding RN Entered By: Georges Mouse, Minus Breeding on 01/15/2021 11:57:08 [name] [name] ([id number]) -------------------------------------------------------------------------------- Pain Assessment Details Patient Name: [name] [name]. Date of Service: 01/15/2021 11:00 AM Medical Record Number: [id number] Patient Account Number: [id number] Date of Birth/Sex: 03-23-1935 (86 y.o. M) Treating RN: Carlene Coria Primary Care Sheetal Lyall: Crissie Figures Other Clinician: Jeanine Luz Referring Piper Hassebrock: Crissie Figures Treating Jameika Kinn/Extender: Skipper Cliche in Treatment: 13 Active Problems Location of Pain Severity and Description of Pain Patient Has Paino No Site Locations Pain Management and Medication Current Pain Management: Electronic Signature(s) Signed: 01/26/2021 8:33:59 AM By: Carlene Coria RN Entered By: Carlene Coria on 01/15/2021 11:25:45 [name] [name] ([id number]) -------------------------------------------------------------------------------- Patient/Caregiver Education Details Patient Name: [name] [name]. Date of Service: 01/15/2021 11:00 AM Medical Record Number: [id number] Patient Account Number: [id number] Date of Birth/Gender: 03/16/1935 (86 y.o. M) Treating RN: Dolan Amen Primary Care Physician: Crissie Figures Other Clinician: Jeanine Luz Referring Physician: Crissie Figures Treating Physician/Extender: Skipper Cliche in Treatment: 13 Education Assessment Education Provided To: Patient Education Topics Provided Wound/Skin Impairment: Methods: Demonstration, Explain/Verbal Responses: State content correctly Electronic Signature(s) Signed: 01/15/2021  5:11:22 PM By: Georges Mouse, Minus Breeding RN Entered By: Georges Mouse, Minus Breeding on 01/15/2021 12:02:50 [name] [name] ([id number]) -------------------------------------------------------------------------------- Wound Assessment Details Patient Name: [name] [name], [name] [name]. Date of Service: 01/15/2021 11:00 AM Medical Record Number: [id number] Patient Account Number: [id number] Date of Birth/Sex: 1934/10/22 (86 y.o. M) Treating RN: Carlene Coria Primary Care Hadi Dubin: Crissie Figures Other Clinician: Jeanine Luz Referring Cornelious Diven: Crissie Figures Treating Zanae Kuehnle/Extender: Skipper Cliche in Treatment: 13 Wound Status Wound Number: 1 Primary Diabetic Wound/Ulcer of the Lower Extremity Etiology: Wound Location: Left, Lateral Foot Wound Open Wounding Event: Gradually Appeared Status: Date Acquired: 09/14/2019 Comorbid Chronic Obstructive Pulmonary Disease (COPD), Weeks Of Treatment: 13 History: Congestive Heart Failure, Hypertension, Myocardial Clustered Wound: No Infarction, Type II Diabetes Photos Wound Measurements Length: (cm) 0.3 Width: (cm) 0.7 Depth: (cm) 0.1 Area: (cm) 0.165 Volume: (cm) 0.016 % Reduction in Area: 95% % Reduction in Volume: 97.6% Epithelialization: None Tunneling: No Undermining: No Wound Description Classification: Grade 2 Exudate Amount: Medium Exudate Type: Serosanguineous Exudate Color: red, brown Foul Odor After Cleansing: No Slough/Fibrino Yes Wound Bed Granulation Amount: Large (67-100%) Exposed Structure Granulation Quality: Red, Pink Fascia Exposed: No Necrotic Amount: Small (1-33%) Fat Layer (Subcutaneous Tissue) Exposed: Yes Necrotic Quality: Adherent Slough Tendon Exposed: No Muscle Exposed: No Joint Exposed: No Bone Exposed: No Treatment Notes Wound #1 (Foot) Wound Laterality: Left, Lateral Cleanser Normal Saline Discharge Instruction: Wash your hands with soap and water. Remove old dressing, discard into plastic bag and  place into trash. Cleanse the wound with Normal Saline prior to applying a clean dressing using gauze sponges, not tissues or cotton balls. Do not scrub or use excessive force. Pat dry using gauze sponges, not tissue or cotton balls. [name] [name], [name] [name] ([id number]) Peri-Wound Care Topical Primary Dressing Silvercel Small 2x2 (in/in) Discharge Instruction: Apply Silvercel Small 2x2 (in/in) as instructed Secondary Dressing Bordered Gauze Sterile-HBD 4x4 (in/in) Discharge Instruction: Cover wound with Bordered Guaze Sterile as directed Secured With Compression Wrap Compression Stockings Add-Ons Electronic Signature(s) Signed: 01/26/2021 8:33:59 AM By: Carlene Coria RN Entered By: Carlene Coria on 01/15/2021 11:37:09 [name] [name] ([id number]) -------------------------------------------------------------------------------- Wound Assessment Details Patient Name: [name] [name]. Date of Service: 01/15/2021 11:00 AM Medical Record Number: [id number] Patient Account Number: [id number] Date of Birth/Sex: 1935-03-24 (86 y.o. M) Treating RN: Carlene Coria Primary Care Sholom Dulude: Crissie Figures Other Clinician: Jeanine Luz Referring Devera Englander: Crissie Figures Treating Jolanta Cabeza/Extender: Skipper Cliche in Treatment: 13 Wound Status Wound Number: 2 Primary Diabetic Wound/Ulcer of the Lower Extremity Etiology: Wound Location: Left Toe Second Wound Open Wounding Event: Gradually Appeared Status: Date Acquired: 01/12/2021 Comorbid Chronic Obstructive Pulmonary Disease (COPD), Weeks Of Treatment: 0 History: Congestive Heart Failure, Hypertension, Myocardial Clustered Wound: No Infarction, Type II Diabetes Photos Wound Measurements Length: (cm) 1 Width: (cm) 1 Depth: (cm) 0.1 Area: (cm) 0.785 Volume: (cm) 0.079 % Reduction in Area: % Reduction in Volume: Epithelialization: None Tunneling: No Undermining: No Wound Description Classification: Grade 2 Exudate Amount:  Medium Exudate Type: Serosanguineous Exudate Color: red, brown Foul Odor After Cleansing: No Slough/Fibrino No Wound Bed Granulation Amount: None Present (0%) Exposed Structure Necrotic Amount: None Present (0%) Fascia Exposed: No Fat Layer (Subcutaneous Tissue) Exposed: No Tendon Exposed: No Muscle Exposed: No Joint Exposed: No Bone Exposed: No Treatment Notes Wound #2 (Toe Second) Wound Laterality: Left Cleanser Normal Saline Discharge Instruction: Wash your hands with soap and water. Remove old dressing, discard into plastic bag and place into trash. Cleanse the wound with Normal Saline prior to applying a clean dressing using gauze sponges, not tissues or cotton balls. Do not scrub or use excessive force. Pat dry using gauze sponges, not tissue or cotton balls. [name] [name], [name] [name] ([id number]) Peri-Wound Care Topical Primary Dressing Silvercel Small 2x2 (in/in) Discharge Instruction: Apply Silvercel Small 2x2 (in/in) as instructed Secondary Dressing Telfa Adhesive Island Dressing, 4x4 (in/in) Discharge Instruction: Apply over dressing to secure in place. Secured With Compression Wrap Compression Stockings Add-Ons Electronic Signature(s) Signed: 01/26/2021 8:33:59 AM By: Carlene Coria RN Entered By: Carlene Coria on 01/15/2021 11:35:52 [name] [name] ([id number]) -------------------------------------------------------------------------------- Vitals Details Patient Name: [name] [name]. Date of Service: 01/15/2021 11:00 AM Medical Record Number: [id number] Patient Account Number: [id number] Date of Birth/Sex: [birth year] (86 y.o. M) Treating RN: Carlene Coria Primary Care Sibbie Flammia: Crissie Figures Other Clinician: Jeanine Luz Referring Bluford Sedler: Crissie Figures Treating Jamaree Hosier/Extender: Skipper Cliche in Treatment: 13 Vital Signs Time Taken: 11:25 Temperature (F): 98.3 Height (in): 70 Pulse (bpm): 55 Weight (lbs): 151 Respiratory Rate (breaths/min):  16 Body Mass Index (BMI): 21.7 Blood Pressure (mmHg): 169/65 Reference Range: 80 - 120 mg / dl Electronic Signature(s) Signed: 01/26/2021 8:33:59 AM By: Carlene Coria RN Entered By: Carlene Coria on 01/15/2021 11:25:39

## 2021-01-22 ENCOUNTER — Other Ambulatory Visit (HOSPITAL_COMMUNITY): Payer: Self-pay | Admitting: Family Medicine

## 2021-01-22 ENCOUNTER — Ambulatory Visit: Payer: Medicare Other | Admitting: Physician Assistant

## 2021-02-06 ENCOUNTER — Other Ambulatory Visit: Payer: Self-pay

## 2021-02-06 ENCOUNTER — Encounter: Payer: Medicare Other | Admitting: Internal Medicine

## 2021-02-06 DIAGNOSIS — E11621 Type 2 diabetes mellitus with foot ulcer: Secondary | ICD-10-CM | POA: Diagnosis not present

## 2021-02-10 NOTE — Progress Notes (Signed)
[name] [name] ([id number]) Visit Report for 02/06/2021 HPI Details Patient Name: [name] [name], [name] [name]. Date of Service: 02/06/2021 11:15 AM Medical Record Number: [id number] Patient Account Number: [id number] Date of Birth/Sex: 01-Feb-1935 (86 y.o. M) Treating RN: Carlene Coria Primary Care Provider: Crissie Figures Other Clinician: Referring Provider: Crissie Figures Treating Provider/Extender: Tito Dine in Treatment: 17 History of Present Illness HPI Description: 10/10/2020 upon evaluation today patient presents for initial evaluation in our clinic concerning a wound he has on the left lateral foot. 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 With that being said currently he has a wound portion of his left foot that is what we are seeing him for today everything else appears to actually be doing quite well which is great news. There does not appear to be any signs of active infection at this time which is also great news. He does have a history of diabetes mellitus type 2, chronic kidney disease stage III, hypertension, paroxysmal atrial fibrillation, and long-term use of anticoagulant therapy due to the atrial fibrillation. He does not have any pain secondary to neuropathy. His most recent hemoglobin A1c was 6.5 roughly a year ago as best we can tell. He does have [facility] coming out currently to help take care of his wound. 10/24/2020 upon evaluation today patient appears to be doing well with regard to his wound. In fact I am very pleased with how things appear to be doing at this time. That is the least of his concerns at this time. Overall I am extremely pleased with the progress has been made. With that being said my biggest concern at this point he has actually not to do with his wound and other his blood pressure  which is significantly elevated today. I am not certain that he has been taking his blood pressure upon further questioning with the patient and his family in fact I am almost certain that he is not. We did attempt to call his primary care provider but unfortunately we were unable to get them on the phone. The phone just rang. Nonetheless I think that he has an appointment with the cardiologist on Monday they can obviously recheck his blood pressure then. He also needs to get an appointment with his primary care provider likely to discuss his blood pressures. But also if he is not taking the medicine taking the medicine could obviously make a big difference in his blood pressure as it stands as well. 01/01/2021 upon evaluation today patient appears to be doing decently well in regard to his foot ulcer. He is going require some sharp debridement is actually been quite sometime since have seen him I think the main issue here is that he unfortunately was having some issues with transportation and just not feeling well. Either way I think that we need to definitely get things back in order and seeing him on a regular basis. The patient voiced understanding. With that being said his wound is going require sharp debridement at this point. 01/15/2021 upon evaluation today patient appears to be doing well with regard to his lateral foot ulcer. Unfortunately he has a new wound on his left second toe which appears to be something that rubbed. He tells me that this is something that happened as a result of him clipping his toenails. Again  I am not exactly sure how that would have occurred however. Nonetheless I think this is a significant issue here that is going to require some potential intervention. 5/27; his lateral foot wound over the fifth met head is closed. He still has an area on the tip of the left second toe although this appears to be smaller than measured last time it certainly does not probe to bone  he has a hammertoe in this area. He is wearing a surgical shoe Electronic Signature(s) Signed: 02/06/2021 4:35:25 PM By: Linton Ham MD Entered By: Linton Ham on 02/06/2021 12:08:57 [name] ([id number]) -------------------------------------------------------------------------------- Physical Exam Details Patient Name: [name] [name]. Date of Service: 02/06/2021 11:15 AM Medical Record Number: [id number] Patient Account Number: [id number] Date of Birth/Sex: 10/21/1934 (86 y.o. M) Treating RN: Carlene Coria Primary Care Provider: Crissie Figures Other Clinician: Referring Provider: Crissie Figures Treating Provider/Extender: Tito Dine in Treatment: 53 Constitutional Patient is hypertensive.. Pulse regular and within target range for patient.Marland Kitchen Respirations regular, non-labored and within target range.. Temperature is normal and within the target range for the patient.Marland Kitchen appears in no distress. Notes Wound exam; the area on the lateral part of the fifth metatarsal head has closed. He still has an area on the tip of his left second toe tip. This area looked clean and there was some epithelialization. It did not probe to bone and did not have much depth. All of this quite a bit better than the last time he was here. He does have a hammertoe in this area but the tip of his toe does not appear to go straight down. He seems aware of offloading this area from the front part of his shoes Electronic Signature(s) Signed: 02/06/2021 4:35:25 PM By: Linton Ham MD Entered By: Linton Ham on 02/06/2021 12:10:58 [name] ([id number]) -------------------------------------------------------------------------------- Physician Orders Details Patient Name: [name]. Date of Service: 02/06/2021 11:15 AM Medical Record Number: [id number] Patient Account Number: [id number] Date of Birth/Sex: [birth year] (86 y.o. M) Treating RN: Carlene Coria Primary Care  Provider: Crissie Figures Other Clinician: Referring Provider: Crissie Figures Treating Provider/Extender: Tito Dine in Treatment: 38 Verbal / Phone Orders: No Diagnosis Coding Follow-up Appointments o Return Appointment in 1 week. [place] for wound care. May utilize formulary equivalent dressing for wound treatment orders unless otherwise specified. Home Health Nurse may visit [facility] to address patientos wound care needs. - [facility] Bathing/ Shower/ Hygiene o Clean wound with Normal Saline or wound cleanser. o May shower with wound dressing protected with water repellent cover or cast protector. Edema Control - Lymphedema / Segmental Compressive Device / Other o Elevate, Exercise Daily and Avoid Standing for Long Periods of Time. o Elevate legs to the level of the heart and pump ankles as often as possible o Elevate leg(s) parallel to the floor when sitting. Non-Wound Condition o Additional non-wound orders/instructions: - apply foam protection to left lat foot daily Off-Loading Bilateral Lower Extremities o Open toe surgical shoe Wound Treatment Wound #2 - Toe Second Wound Laterality: Left Cleanser: Normal Saline 3 x Per Week/30 Days Discharge Instructions: Wash your hands with soap and water. Remove old dressing, discard into plastic bag and place into trash. Cleanse the wound with Normal Saline prior to applying a clean dressing using gauze sponges, not tissues or cotton balls. Do not scrub or use excessive force. Pat dry using gauze sponges, not tissue or cotton balls. Primary Dressing: Silvercel Small 2x2 (in/in) 3 x Per  Week/30 Days Discharge Instructions: Apply Silvercel Small 2x2 (in/in) as instructed Secondary Dressing: [place] Dressing, 4x4 (in/in) 3 x Per Week/30 Days Discharge Instructions: Apply over dressing to secure in place. Electronic Signature(s) Signed: 02/06/2021 4:35:25 PM By: Linton Ham  MD Signed: 02/10/2021 3:50:09 PM By: Carlene Coria RN Entered By: Carlene Coria on 02/06/2021 11:30:13 [name] ([id number]) -------------------------------------------------------------------------------- Problem List Details Patient Name: STORMY, SABOL. Date of Service: 02/06/2021 11:15 AM Medical Record Number: [id number] Patient Account Number: [id number] Date of Birth/Sex: [birth year] (86 y.o. M) Treating RN: Carlene Coria Primary Care Provider: Crissie Figures Other Clinician: Referring Provider: Crissie Figures Treating Provider/Extender: Tito Dine in Treatment: 17 Active Problems ICD-10 Encounter Code Description Active Date MDM Diagnosis E11.621 Type 2 diabetes mellitus with foot ulcer 10/10/2020 No Yes L97.522 Non-pressure chronic ulcer of other part of left foot with fat layer 10/10/2020 No Yes exposed N18.30 Chronic kidney disease, stage 3 unspecified 10/10/2020 No Yes I10 Essential (primary) hypertension 10/10/2020 No Yes I48.0 Paroxysmal atrial fibrillation 10/10/2020 No Yes Z79.01 Long term (current) use of anticoagulants 10/10/2020 No Yes Inactive Problems Resolved Problems Electronic Signature(s) Signed: 02/06/2021 4:35:25 PM By: Linton Ham MD Entered By: Linton Ham on 02/06/2021 12:07:40 [name] ([id number]) -------------------------------------------------------------------------------- Progress Note Details Patient Name: [name]. Date of Service: 02/06/2021 11:15 AM Medical Record Number: [id number] Patient Account Number: [id number] Date of Birth/Sex: 11/06/1934 (86 y.o. M) Treating RN: Carlene Coria Primary Care Provider: Crissie Figures Other Clinician: Referring Provider: Crissie Figures Treating Provider/Extender: Tito Dine in Treatment: 17 Subjective History of Present Illness (HPI) 10/10/2020 upon evaluation today patient presents for initial evaluation in our clinic concerning a wound he has  on the left lateral foot. 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 With that being said currently he has a wound portion of his left foot that is what we are seeing him for today everything else appears to actually be doing quite well which is great news. There does not appear to be any signs of active infection at this time which is also great news. He does have a history of diabetes mellitus type 2, chronic kidney disease stage III, hypertension, paroxysmal atrial fibrillation, and long-term use of anticoagulant therapy due to the atrial fibrillation. He does not have any pain secondary to neuropathy. His most recent hemoglobin A1c was 6.5 roughly a year ago as best we can tell. He does have [facility] coming out currently to help take care of his wound. 10/24/2020 upon evaluation today patient appears to be doing well with regard to his wound. In fact I am very pleased with how things appear to be doing at this time. That is the least of his concerns at this time. Overall I am extremely pleased with the progress has been made. With that being said my biggest concern at this point he has actually not to do with his wound and other his blood pressure which is significantly elevated today. I am not certain that he has been taking his blood pressure upon further questioning with the patient and his family in fact I am almost certain that he is not. We did attempt to call his primary care provider but unfortunately we were unable to get them on the phone. The phone just rang. Nonetheless I think that  he has an appointment with the cardiologist on Monday they can obviously recheck his blood pressure then. He also needs to get an appointment with his primary care provider likely to discuss his blood pressures. But also if he is not taking the  medicine taking the medicine could obviously make a big difference in his blood pressure as it stands as well. 01/01/2021 upon evaluation today patient appears to be doing decently well in regard to his foot ulcer. He is going require some sharp debridement is actually been quite sometime since have seen him I think the main issue here is that he unfortunately was having some issues with transportation and just not feeling well. Either way I think that we need to definitely get things back in order and seeing him on a regular basis. The patient voiced understanding. With that being said his wound is going require sharp debridement at this point. 01/15/2021 upon evaluation today patient appears to be doing well with regard to his lateral foot ulcer. Unfortunately he has a new wound on his left second toe which appears to be something that rubbed. He tells me that this is something that happened as a result of him clipping his toenails. Again I am not exactly sure how that would have occurred however. Nonetheless I think this is a significant issue here that is going to require some potential intervention. 5/27; his lateral foot wound over the fifth met head is closed. He still has an area on the tip of the left second toe although this appears to be smaller than measured last time it certainly does not probe to bone he has a hammertoe in this area. He is wearing a surgical shoe Objective Constitutional Patient is hypertensive.. Pulse regular and within target range for patient.Marland Kitchen Respirations regular, non-labored and within target range.. Temperature is normal and within the target range for the patient.Marland Kitchen appears in no distress. Vitals Time Taken: 10:39 AM, Height: 70 in, Weight: 151 lbs, BMI: 21.7, Temperature: 98.1 F, Pulse: 50 bpm, Respiratory Rate: 16 breaths/min, Blood Pressure: 189/72 mmHg. General Notes: Wound exam; the area on the lateral part of the fifth metatarsal head has closed. He still  has an area on the tip of his left second toe tip. This area looked clean and there was some epithelialization. It did not probe to bone and did not have much depth. All of this quite a bit better than the last time he was here. He does have a hammertoe in this area but the tip of his toe does not appear to go straight down. He seems aware of offloading this area from the front part of his shoes Integumentary (Hair, Skin) Wound #1 status is Open. Original cause of wound was Gradually Appeared. The date acquired was: 09/14/2019. The wound has been in treatment 17 weeks. The wound is located on the Left,Lateral Foot. The wound measures 0cm length x 0cm width x 0cm depth; 0cm^2 area and 0cm^3 volume. There is no tunneling or undermining noted. There is a none present amount of drainage noted. There is no granulation within the wound bed. There is no necrotic tissue within the wound bed. Wound #2 status is Open. Original cause of wound was Gradually Appeared. The date acquired was: 01/12/2021. The wound has been in treatment 3 weeks. The wound is located on the Left Toe Second. The wound measures 0.6cm length x 0.6cm width x 0.1cm depth; 0.283cm^2 area and 0.028cm^3 volume. There is Fat Layer (Subcutaneous Tissue) exposed. There is  no tunneling or undermining noted. There is a medium amount Efferson, Aqib L. ([id number]) of serous drainage noted. There is medium (34-66%) pink granulation within the wound bed. There is a medium (34-66%) amount of necrotic tissue within the wound bed including Adherent Slough. Assessment Active Problems ICD-10 Type 2 diabetes mellitus with foot ulcer Non-pressure chronic ulcer of other part of left foot with fat layer exposed Chronic kidney disease, stage 3 unspecified Essential (primary) hypertension Paroxysmal atrial fibrillation Long term (current) use of anticoagulants Plan Follow-up Appointments: Return Appointment in 1 week. Home Health: [facility]  for wound care. May utilize formulary equivalent dressing for wound treatment orders unless otherwise specified. Home Health Nurse may visit [facility] to address patient s wound care needs. - [facility] Bathing/ Shower/ Hygiene: Clean wound with Normal Saline or wound cleanser. May shower with wound dressing protected with water repellent cover or cast protector. Edema Control - Lymphedema / Segmental Compressive Device / Other: Elevate, Exercise Daily and Avoid Standing for Long Periods of Time. Elevate legs to the level of the heart and pump ankles as often as possible Elevate leg(s) parallel to the floor when sitting. Non-Wound Condition: Additional non-wound orders/instructions: - apply foam protection to left lat foot daily Off-Loading: Open toe surgical shoe WOUND #2: - Toe Second Wound Laterality: Left Cleanser: Normal Saline 3 x Per Week/30 Days Discharge Instructions: Wash your hands with soap and water. Remove old dressing, discard into plastic bag and place into trash. Cleanse the wound with Normal Saline prior to applying a clean dressing using gauze sponges, not tissues or cotton balls. Do not scrub or use excessive force. Pat dry using gauze sponges, not tissue or cotton balls. Primary Dressing: Silvercel Small 2x2 (in/in) 3 x Per Week/30 Days Discharge Instructions: Apply Silvercel Small 2x2 (in/in) as instructed Secondary Dressing: [place] Dressing, 4x4 (in/in) 3 x Per Week/30 Days Discharge Instructions: Apply over dressing to secure in place. 1. I am continuing with the silver alginate to the left second toe 2. I have advised keeping the area of the lateral part of his fifth metatarsal head padded with a thick Band-Aid or foam especially when he is at work. 3. We have been seeing him every 2 weeks hopefully the area of the tip of his toe will heal and it should as long as he offloads this properly. 4. His peripheral pulses were palpable although certainly not  robust. I do not think he has an arterial issue however Electronic Signature(s) Signed: 02/06/2021 4:35:25 PM By: Linton Ham MD Entered By: Linton Ham on 02/06/2021 12:12:12 [name] ([id number]) -------------------------------------------------------------------------------- [place] Details Patient Name: [name]. Date of Service: 02/06/2021 Medical Record Number: [id number] Patient Account Number: [id number] Date of Birth/Sex: 07/19/1935 (86 y.o. M) Treating RN: Carlene Coria Primary Care Provider: Crissie Figures Other Clinician: Referring Provider: Crissie Figures Treating Provider/Extender: Tito Dine in Treatment: 17 Diagnosis Coding ICD-10 Codes Code Description E11.621 Type 2 diabetes mellitus with foot ulcer L97.522 Non-pressure chronic ulcer of other part of left foot with fat layer exposed N18.30 Chronic kidney disease, stage 3 unspecified I10 Essential (primary) hypertension I48.0 Paroxysmal atrial fibrillation Z79.01 Long term (current) use of anticoagulants Facility Procedures CPT4 Code: [id number] Description: [phone] - WOUND CARE VISIT-LEV 2 EST PT Modifier: Quantity: 1 Physician Procedures CPT4 Code: 7017793 Description: 99213 - WC PHYS LEVEL 3 - EST PT Modifier: Quantity: 1 CPT4 Code: Description: ICD-10 Diagnosis Description E11.621 Type 2 diabetes mellitus with foot ulcer L97.522 Non-pressure chronic ulcer  of other part of left foot with fat layer ex Modifier: posed Quantity: Electronic Signature(s) Signed: 02/06/2021 4:35:25 PM By: Linton Ham MD Entered By: Linton Ham on 02/06/2021 12:12:30

## 2021-02-10 NOTE — Progress Notes (Signed)
MILFORD, CILENTO ([id number]) Visit Report for 02/06/2021 Arrival Information Details Patient Name: [name] [name], [name] [name]. Date of Service: 02/06/2021 11:15 AM Medical Record Number: [id number] Patient Account Number: [id number] Date of Birth/Sex: 23-Apr-1935 (86 y.o. M) Treating RN: Carlene Coria Primary Care Amily Depp: Crissie Figures Other Clinician: Referring Sabastian Raimondi: Crissie Figures Treating Ardell Makarewicz/Extender: Tito Dine in Treatment: 48 Visit Information History Since Last Visit Added or deleted any medications: No Patient Arrived: [name] [name] Had a fall or experienced change in No Arrival Time: 10:37 activities of daily living that may affect Accompanied By: self risk of falls: Transfer Assistance: None Hospitalized since last visit: No Patient Identification Verified: Yes Pain Present Now: No Secondary Verification Process Completed: Yes Patient Requires Transmission-Based No Precautions: Patient Has Alerts: Yes Patient Alerts: Patient on Blood Thinner NON COMPRESSABLE Electronic Signature(s) Signed: 02/06/2021 4:08:41 PM By: Jeanine Luz Entered By: Jeanine Luz on 02/06/2021 10:39:17 [name] [name] ([id number]) -------------------------------------------------------------------------------- Clinic Level of Care Assessment Details Patient Name: [name] [name]. Date of Service: 02/06/2021 11:15 AM Medical Record Number: [id number] Patient Account Number: [id number] Date of Birth/Sex: [birth year] (86 y.o. M) Treating RN: Carlene Coria Primary Care Addi Pak: Crissie Figures Other Clinician: Referring Yao Hyppolite: Crissie Figures Treating Lucee Brissett/Extender: Tito Dine in Treatment: 17 Clinic Level of Care Assessment Items TOOL 4 Quantity Score X - Use when only an EandM is performed on FOLLOW-UP visit 1 0 ASSESSMENTS - Nursing Assessment / Reassessment X - Reassessment of Co-morbidities (includes updates in patient status) 1 10 X- 1  5 Reassessment of Adherence to Treatment Plan ASSESSMENTS - Wound and Skin Assessment / Reassessment X - Simple Wound Assessment / Reassessment - one wound 1 5 []  - 0 Complex Wound Assessment / Reassessment - multiple wounds []  - 0 Dermatologic / Skin Assessment (not related to wound area) ASSESSMENTS - Focused Assessment []  - Circumferential Edema Measurements - multi extremities 0 []  - 0 Nutritional Assessment / Counseling / Intervention []  - 0 Lower Extremity Assessment (monofilament, tuning fork, pulses) []  - 0 Peripheral Arterial Disease Assessment (using hand held doppler) ASSESSMENTS - Ostomy and/or Continence Assessment and Care []  - Incontinence Assessment and Management 0 []  - 0 Ostomy Care Assessment and Management (repouching, etc.) PROCESS - Coordination of Care X - Simple Patient / Family Education for ongoing care 1 15 []  - 0 Complex (extensive) Patient / Family Education for ongoing care []  - 0 Staff obtains Programmer, systems, Records, Test Results / Process Orders []  - 0 Staff telephones HHA, Nursing Homes / Clarify orders / etc []  - 0 Routine Transfer to another Facility (non-emergent condition) []  - 0 Routine Hospital Admission (non-emergent condition) []  - 0 New Admissions / Biomedical engineer / Ordering NPWT, Apligraf, etc. []  - 0 Emergency Hospital Admission (emergent condition) X- 1 10 Simple Discharge Coordination []  - 0 Complex (extensive) Discharge Coordination PROCESS - Special Needs []  - Pediatric / Minor Patient Management 0 []  - 0 Isolation Patient Management []  - 0 Hearing / Language / Visual special needs []  - 0 Assessment of Community assistance (transportation, D/C planning, etc.) []  - 0 Additional assistance / Altered mentation []  - 0 Support Surface(s) Assessment (bed, cushion, seat, etc.) INTERVENTIONS - Wound Cleansing / Measurement Beazley, Delos L. ([id number]) X- 1 5 Simple Wound Cleansing - one wound []  - 0 Complex Wound  Cleansing - multiple wounds []  - 0 Wound Imaging (photographs - any number of wounds) []  - 0 Wound Tracing (instead of photographs) X- 1 5 Simple Wound Measurement - one wound []  -  0 Complex Wound Measurement - multiple wounds INTERVENTIONS - Wound Dressings X - Small Wound Dressing one or multiple wounds 1 10 []  - 0 Medium Wound Dressing one or multiple wounds []  - 0 Large Wound Dressing one or multiple wounds X- 1 5 Application of Medications - topical []  - 0 Application of Medications - injection INTERVENTIONS - Miscellaneous []  - External ear exam 0 []  - 0 Specimen Collection (cultures, biopsies, blood, body fluids, etc.) []  - 0 Specimen(s) / Culture(s) sent or taken to Lab for analysis []  - 0 Patient Transfer (multiple staff / Civil Service fast streamer / Similar devices) []  - 0 Simple Staple / Suture removal (25 or less) []  - 0 Complex Staple / Suture removal (26 or more) []  - 0 Hypo / Hyperglycemic Management (close monitor of Blood Glucose) []  - 0 Ankle / Brachial Index (ABI) - do not check if billed separately X- 1 5 Vital Signs Has the patient been seen at the hospital within the last three years: Yes Total Score: 75 Level Of Care: New/Established - Level 2 Electronic Signature(s) Signed: 02/10/2021 3:50:09 PM By: Carlene Coria RN Entered By: Carlene Coria on 02/06/2021 11:30:53 [name] [name] ([id number]) -------------------------------------------------------------------------------- Encounter Discharge Information Details Patient Name: [name] [name]. Date of Service: 02/06/2021 11:15 AM Medical Record Number: [id number] Patient Account Number: [id number] Date of Birth/Sex: [birth year] (86 y.o. M) Treating RN: Carlene Coria Primary Care Kilah Drahos: Crissie Figures Other Clinician: Referring Vrinda Heckstall: Crissie Figures Treating Scottie Stanish/Extender: Tito Dine in Treatment: 17 Encounter Discharge Information Items Discharge Condition: Stable Ambulatory  Status: Ambulatory Discharge Destination: Home Transportation: Private Auto Accompanied By: self Schedule Follow-up Appointment: Yes Clinical Summary of Care: Patient Declined Electronic Signature(s) Signed: 02/10/2021 3:50:09 PM By: Carlene Coria RN Entered By: Carlene Coria on 02/06/2021 11:43:29 Study, Eric Form ([id number]) -------------------------------------------------------------------------------- Lower Extremity Assessment Details Patient Name: [name] [name]. Date of Service: 02/06/2021 11:15 AM Medical Record Number: [id number] Patient Account Number: [id number] Date of Birth/Sex: 09-07-1935 (86 y.o. M) Treating RN: Carlene Coria Primary Care Ahonesty Woodfin: Crissie Figures Other Clinician: Referring Anniston Nellums: Crissie Figures Treating Yamileth Hayse/Extender: Tito Dine in Treatment: 17 Electronic Signature(s) Signed: 02/06/2021 4:08:41 PM By: Jeanine Luz Signed: 02/10/2021 3:50:09 PM By: Carlene Coria RN Entered By: Jeanine Luz on 02/06/2021 10:50:25 [name] [name] ([id number]) -------------------------------------------------------------------------------- Multi Wound Chart Details Patient Name: [name] [name], [name] [name]. Date of Service: 02/06/2021 11:15 AM Medical Record Number: [id number] Patient Account Number: [id number] Date of Birth/Sex: 1934/12/28 (86 y.o. 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  Extremity Comorbid History: Chronic Obstructive Pulmonary Chronic Obstructive Pulmonary N/A Disease (COPD), Congestive Heart Disease (COPD), Congestive Heart Failure, Hypertension, Myocardial Failure, Hypertension, Myocardial Infarction, Type II Diabetes Infarction, Type II Diabetes Date Acquired: 09/14/2019 01/12/2021 N/A Weeks of Treatment: 17 3 N/A Wound Status: Open Open N/A Measurements L x W x D (cm) 0x0x0 0.6x0.6x0.1 N/A Area (cm) : 0 0.283 N/A Volume (cm) : 0 0.028 N/A % Reduction in Area: 100.00% 63.90% N/A % Reduction in Volume: 100.00% 64.60% N/A Classification: Grade 2 Grade 2 N/A Exudate Amount: None Present Medium N/A Exudate Type: N/A Serous  N/A Exudate Color: N/A amber N/A Granulation Amount: None Present (0%) Medium (34-66%) N/A Granulation Quality: N/A Pink N/A Necrotic Amount: None Present (0%) Medium (34-66%) N/A Exposed Structures: Fascia: No Fat Layer (Subcutaneous Tissue): N/A Fat Layer (Subcutaneous Tissue): Yes No Fascia: No Tendon: No Tendon: No Muscle: No Muscle: No Joint: No Joint: No Bone: No Bone: No Epithelialization: None None N/A Treatment Notes Wound #1 (Foot) Wound Laterality: Left, Lateral Cleanser Peri-Wound Care Topical DAELAN, GATT ([id number]) Primary Dressing Secondary Dressing Secured With Compression Wrap Compression Stockings Add-Ons Notes FOAM APPLIED TO LEFT LAT FOOT Wound #2 (Toe Second) Wound Laterality: Left Cleanser Normal Saline Discharge Instruction: Wash your hands with soap and water. Remove old dressing, discard into plastic bag and place into trash. Cleanse the wound with Normal Saline prior to applying a clean dressing using gauze sponges, not tissues or cotton balls. Do not scrub or use excessive force. Pat dry using gauze sponges, not tissue or cotton balls. Peri-Wound Care Topical Primary Dressing Silvercel Small 2x2 (in/in) Discharge Instruction: Apply Silvercel Small 2x2 (in/in) as  instructed Secondary Dressing Telfa Adhesive Island Dressing, 4x4 (in/in) Discharge Instruction: Apply over dressing to secure in place. Secured With Compression Wrap Compression Stockings Add-Ons Notes FOAM APPLIED TO LEFT LAT FOOT Electronic Signature(s) Signed: 02/06/2021 4:35:25 PM By: Linton Ham MD Entered By: Linton Ham on 02/06/2021 12:07:52 [name] [name] ([id number]) -------------------------------------------------------------------------------- [place] Details Patient Name: [name] [name], [name] [name]. Date of Service: 02/06/2021 11:15 AM Medical Record Number: [id number] Patient Account Number: [id number] Date of Birth/Sex: May 06, 1935 (86 y.o. M) Treating RN: Carlene Coria Primary Care Raghav Verrilli: Crissie Figures Other Clinician: Referring Margarette Vannatter: Crissie Figures Treating Ziyonna Christner/Extender: Tito Dine in Treatment: 17 Active Inactive Wound/Skin Impairment Nursing Diagnoses: Knowledge deficit related to ulceration/compromised skin integrity Goals: Patient/caregiver will verbalize understanding of skin care regimen Date Initiated: 10/10/2020 Date Inactivated: 01/01/2021 Target Resolution Date: 11/10/2020 Goal Status: Met Ulcer/skin breakdown will have a volume reduction of 30% by week 4 Date Initiated: 10/10/2020 Target Resolution Date: 11/10/2020 Goal Status: Active Ulcer/skin breakdown will have a volume reduction of 50% by week 8 Date Initiated: 10/10/2020 Target Resolution Date: 12/08/2020 Goal Status: Active Ulcer/skin breakdown will have a volume reduction of 80% by week 12 Date Initiated: 10/10/2020 Target Resolution Date: 01/08/2021 Goal Status: Active Ulcer/skin breakdown will heal within 14 weeks Date Initiated: 10/10/2020 Target Resolution Date: 02/07/2021 Goal Status: Active Interventions: Assess patient/caregiver ability to obtain necessary supplies Assess patient/caregiver ability to perform ulcer/skin care regimen  upon admission and as needed Assess ulceration(s) every visit Notes: Electronic Signature(s) Signed: 02/10/2021 3:50:09 PM By: Carlene Coria RN Entered By: Carlene Coria on 02/06/2021 11:27:34 [name] [name] ([id number]) -------------------------------------------------------------------------------- Pain Assessment Details Patient Name: [name] [name]. Date of Service: 02/06/2021 11:15 AM Medical Record Number: [id number] Patient Account Number: [id number] Date of Birth/Sex: [birth year] (86 y.o. M) Treating RN: Carlene Coria Primary Care Delona Clasby: Crissie Figures Other Clinician: Referring Davine Sweney: Crissie Figures Treating Yosiah Jasmin/Extender: Tito Dine in Treatment: 17 Active Problems Location of Pain Severity and Description of Pain Patient Has Paino No Site Locations Rate the pain. Current Pain Level: 0 Pain Management and Medication Current Pain Management: Electronic Signature(s) Signed: 02/06/2021 4:08:41 PM By: Jeanine Luz Signed: 02/10/2021 3:50:09 PM By: Carlene Coria RN Entered By: Jeanine Luz on 02/06/2021 10:42:37 [name] [name] ([id number]) -------------------------------------------------------------------------------- Patient/Caregiver Education Details Patient Name: [name] [name] Date of Service: 02/06/2021 11:15 AM Medical Record Number: [id number] Patient Account Number: [id number] Date of Birth/Gender: 28-Jun-1935 (86 y.o. M)  Treating RN: Carlene Coria Primary Care Physician: Crissie Figures Other Clinician: Referring Physician: Crissie Figures Treating Physician/Extender: Tito Dine in Treatment: 10 Education Assessment Education Provided To: Patient Education Topics Provided Wound/Skin Impairment: Methods: Explain/Verbal Responses: State content correctly Electronic Signature(s) Signed: 02/10/2021 3:50:09 PM By: Carlene Coria RN Entered By: Carlene Coria on 02/06/2021 11:42:17 [name] [name]  ([id number]) -------------------------------------------------------------------------------- Wound Assessment Details Patient Name: [name] [name], [name] L. Date of Service: 02/06/2021 11:15 AM Medical Record Number: [id number] Patient Account Number: [id number] Date of Birth/Sex: 1934-09-16 (86 y.o. M) Treating RN: Carlene Coria Primary Care Kathaleen Dudziak: Crissie Figures Other Clinician: Referring Tierra Divelbiss: Crissie Figures Treating Daiya Tamer/Extender: Tito Dine in Treatment: 17 Wound Status Wound Number: 1 Primary Diabetic Wound/Ulcer of the Lower Extremity Etiology: Wound Location: Left, Lateral Foot Wound Open Wounding Event: Gradually Appeared Status: Date Acquired: 09/14/2019 Comorbid Chronic Obstructive Pulmonary Disease (COPD), Weeks Of Treatment: 17 History: Congestive Heart Failure, Hypertension, Myocardial Clustered Wound: No Infarction, Type II Diabetes Photos Wound Measurements Length: (cm) 0 Width: (cm) 0 Depth: (cm) 0 Area: (cm) 0 Volume: (cm) 0 % Reduction in Area: 100% % Reduction in Volume: 100% Epithelialization: None Tunneling: No Undermining: No Wound Description Classification: Grade 2 Exudate Amount: None Present Foul Odor After Cleansing: No Slough/Fibrino No Wound Bed Granulation Amount: None Present (0%) Exposed Structure Necrotic Amount: None Present (0%) Fascia Exposed: No Fat Layer (Subcutaneous Tissue) Exposed: No Tendon Exposed: No Muscle Exposed: No Joint Exposed: No Bone Exposed: No Electronic Signature(s) Signed: 02/10/2021 3:50:09 PM By: Carlene Coria RN Entered By: Carlene Coria on 02/06/2021 11:28:55 Lantzy, Eric Form ([id number]) -------------------------------------------------------------------------------- Wound Assessment Details Patient Name: [name] Buttner L. Date of Service: 02/06/2021 11:15 AM Medical Record Number: [id number] Patient Account Number: [id number] Date of Birth/Sex: 05-May-1935 (86 y.o. M) Treating RN:  Carlene Coria Primary Care Delaine Canter: Crissie Figures Other Clinician: Referring Jolette Lana: Crissie Figures Treating Nickayla Mcinnis/Extender: Tito Dine in Treatment: 17 Wound Status Wound Number: 2 Primary Diabetic Wound/Ulcer of the Lower Extremity Etiology: Wound Location: Left Toe Second Wound Open Wounding Event: Gradually Appeared Status: Date Acquired: 01/12/2021 Comorbid Chronic Obstructive Pulmonary Disease (COPD), Weeks Of Treatment: 3 History: Congestive Heart Failure, Hypertension, Myocardial Clustered Wound: No Infarction, Type II Diabetes Photos Wound Measurements Length: (cm) 0.6 Width: (cm) 0.6 Depth: (cm) 0.1 Area: (cm) 0.283 Volume: (cm) 0.028 % Reduction in Area: 63.9% % Reduction in Volume: 64.6% Epithelialization: None Tunneling: No Undermining: No Wound Description Classification: Grade 2 Exudate Amount: Medium Exudate Type: Serous Exudate Color: amber Foul Odor After Cleansing: No Slough/Fibrino Yes Wound Bed Granulation Amount: Medium (34-66%) Exposed Structure Granulation Quality: Pink Fascia Exposed: No Necrotic Amount: Medium (34-66%) Fat Layer (Subcutaneous Tissue) Exposed: Yes Necrotic Quality: Adherent Slough Tendon Exposed: No Muscle Exposed: No Joint Exposed: No Bone Exposed: No Treatment Notes Wound #2 (Toe Second) Wound Laterality: Left Cleanser Normal Saline Discharge Instruction: Wash your hands with soap and water. Remove old dressing, discard into plastic bag and place into trash. Cleanse the wound with Normal Saline prior to applying a clean dressing using gauze sponges, not tissues or cotton balls. Do not scrub or use excessive force. Pat dry using gauze sponges, not tissue or cotton balls. [name] [name], [name] [name] ([id number]) Peri-Wound Care Topical Primary Dressing Silvercel Small 2x2 (in/in) Discharge Instruction: Apply Silvercel Small 2x2 (in/in) as instructed Secondary Dressing Telfa Adhesive [place] Dressing,  4x4 (in/in) Discharge Instruction: Apply over dressing to secure in place. Secured With Compression Wrap Compression Stockings Add-Ons Notes FOAM APPLIED TO LEFT LAT FOOT Electronic Signature(s)  Signed: 02/06/2021 4:08:41 PM By: Jeanine Luz Signed: 02/10/2021 3:50:09 PM By: Carlene Coria RN Entered By: Jeanine Luz on 02/06/2021 10:50:11 [name] [name] ([id number]) -------------------------------------------------------------------------------- Vitals Details Patient Name: [name] [name], [name] [name]. Date of Service: 02/06/2021 11:15 AM Medical Record Number: [id number] Patient Account Number: [id number] Date of Birth/Sex: 1935/01/09 (86 y.o. M) Treating RN: Carlene Coria Primary Care Caileb Rhue: Crissie Figures Other Clinician: Referring Rhiley Tarver: Crissie Figures Treating Lonza Shimabukuro/Extender: Tito Dine in Treatment: 17 Vital Signs Time Taken: 10:39 Temperature (F): 98.1 Height (in): 70 Pulse (bpm): 50 Weight (lbs): 151 Respiratory Rate (breaths/min): 16 Body Mass Index (BMI): 21.7 Blood Pressure (mmHg): 189/72 Reference Range: 80 - 120 mg / dl Electronic Signature(s) Signed: 02/06/2021 4:08:41 PM By: Jeanine Luz Entered By: Jeanine Luz on 02/06/2021 10:41:46

## 2021-02-12 ENCOUNTER — Other Ambulatory Visit: Payer: Self-pay

## 2021-02-12 ENCOUNTER — Encounter: Payer: Medicare Other | Attending: Physician Assistant | Admitting: Physician Assistant

## 2021-02-12 DIAGNOSIS — Z89422 Acquired absence of other left toe(s): Secondary | ICD-10-CM | POA: Diagnosis not present

## 2021-02-12 DIAGNOSIS — E1122 Type 2 diabetes mellitus with diabetic chronic kidney disease: Secondary | ICD-10-CM | POA: Diagnosis not present

## 2021-02-12 DIAGNOSIS — L97522 Non-pressure chronic ulcer of other part of left foot with fat layer exposed: Secondary | ICD-10-CM | POA: Diagnosis not present

## 2021-02-12 DIAGNOSIS — I130 Hypertensive heart and chronic kidney disease with heart failure and stage 1 through stage 4 chronic kidney disease, or unspecified chronic kidney disease: Secondary | ICD-10-CM | POA: Diagnosis not present

## 2021-02-12 DIAGNOSIS — I480 Paroxysmal atrial fibrillation: Secondary | ICD-10-CM | POA: Insufficient documentation

## 2021-02-12 DIAGNOSIS — N183 Chronic kidney disease, stage 3 unspecified: Secondary | ICD-10-CM | POA: Diagnosis not present

## 2021-02-12 DIAGNOSIS — E1140 Type 2 diabetes mellitus with diabetic neuropathy, unspecified: Secondary | ICD-10-CM | POA: Insufficient documentation

## 2021-02-12 DIAGNOSIS — Z89431 Acquired absence of right foot: Secondary | ICD-10-CM | POA: Diagnosis not present

## 2021-02-12 DIAGNOSIS — E11621 Type 2 diabetes mellitus with foot ulcer: Secondary | ICD-10-CM | POA: Insufficient documentation

## 2021-02-12 DIAGNOSIS — Z09 Encounter for follow-up examination after completed treatment for conditions other than malignant neoplasm: Secondary | ICD-10-CM | POA: Diagnosis not present

## 2021-02-12 DIAGNOSIS — Z7901 Long term (current) use of anticoagulants: Secondary | ICD-10-CM | POA: Diagnosis not present

## 2021-02-12 NOTE — Progress Notes (Addendum)
YEISON, SIPPEL ([id number]) Visit Report for 02/12/2021 Chief Complaint Document Details Patient Name: [name] [name], [name] [name]. Date of Service: 02/12/2021 12:30 PM Medical Record Number: [id number] Patient Account Number: [id number] Date of Birth/Sex: [birth year] (86 y.o. M) Treating RN: Dolan Amen Primary Care Provider: Crissie Figures Other Clinician: Referring Provider: Crissie Figures Treating Provider/Extender: Skipper Cliche in Treatment: 17 Information Obtained from: Patient Chief Complaint Left foot ulcer Electronic Signature(s) Signed: 02/12/2021 1:38:07 PM By: Worthy Keeler PA-C Entered By: Worthy Keeler on 02/12/2021 13:38:06 [name] [name] ([id number]) -------------------------------------------------------------------------------- Debridement Details Patient Name: [name] [name] Date of Service: 02/12/2021 12:30 PM Medical Record Number: [id number] Patient Account Number: [id number] Date of Birth/Sex: 06/20/1935 (86 y.o. M) Treating RN: Dolan Amen Primary Care Provider: Crissie Figures Other Clinician: Referring Provider: Crissie Figures Treating Provider/Extender: Skipper Cliche in Treatment: 17 Debridement Performed for Wound #2 Left Toe Second Assessment: Performed By: Physician Tommie Sams., PA-C Debridement Type: Debridement Severity of Tissue Pre Debridement: Fat layer exposed Level of Consciousness (Pre- Awake and Alert procedure): Pre-procedure Verification/Time Out Yes - 13:45 Taken: Start Time: 13:45 Total Area Debrided (L x W): 0.3 (cm) x 0.2 (cm) = 0.06 (cm) Tissue and other material Viable, Non-Viable, Slough, Subcutaneous, Skin: Epidermis, Slough debrided: Level: Skin/Subcutaneous Tissue Debridement Description: Excisional Instrument: Curette Bleeding: Minimum Hemostasis Achieved: Pressure Response to Treatment: Procedure was tolerated well Level of Consciousness (Post- Awake and Alert procedure): Post Debridement  Measurements of Total Wound Length: (cm) 0.3 Width: (cm) 0.2 Depth: (cm) 0.2 Volume: (cm) 0.009 Character of Wound/Ulcer Post Debridement: Stable Severity of Tissue Post Debridement: Fat layer exposed Post Procedure Diagnosis Same as Pre-procedure Electronic Signature(s) Signed: 02/12/2021 5:11:22 PM By: Charlett Nose RN Signed: 02/13/2021 8:18:42 AM By: Worthy Keeler PA-C Entered By: Georges Mouse, Minus Breeding on 02/12/2021 13:45:41 [name] [name] ([id number]) -------------------------------------------------------------------------------- Debridement Details Patient Name: [name] [name]. Date of Service: 02/12/2021 12:30 PM Medical Record Number: [id number] Patient Account Number: [id number] Date of Birth/Sex: [birth year] (86 y.o. M) Treating RN: Dolan Amen Primary Care Provider: Crissie Figures Other Clinician: Referring Provider: Crissie Figures Treating Provider/Extender: Skipper Cliche in Treatment: 17 Debridement Performed for Wound #1R Left,Lateral Foot Assessment: Performed By: Physician Tommie Sams., PA-C Debridement Type: Debridement Severity of Tissue Pre Debridement: Limited to breakdown of skin Level of Consciousness (Pre- Awake and Alert procedure): Pre-procedure Verification/Time Out Yes - 13:45 Taken: Start Time: 13:45 Total Area Debrided (L x W): 0.2 (cm) x 0.2 (cm) = 0.04 (cm) Tissue and other material Non-Viable, Callus debrided: Level: Non-Viable Tissue Debridement Description: Selective/Open Wound Instrument: Curette Bleeding: Minimum Hemostasis Achieved: Pressure Response to Treatment: Procedure was tolerated well Level of Consciousness (Post- Awake and Alert procedure): Post Debridement Measurements of Total Wound Length: (cm) 0.2 Width: (cm) 0.2 Depth: (cm) 0.1 Volume: (cm) 0.003 Character of Wound/Ulcer Post Debridement: Stable Severity of Tissue Post Debridement: Limited to breakdown of skin Post Procedure  Diagnosis Same as Pre-procedure Electronic Signature(s) Signed: 02/12/2021 5:11:22 PM By: Charlett Nose RN Signed: 02/13/2021 8:18:42 AM By: Worthy Keeler PA-C Entered By: Georges Mouse, Minus Breeding on 02/12/2021 13:48:58 [name] [name] ([id number]) -------------------------------------------------------------------------------- HPI Details Patient Name: [name] [name]. Date of Service: 02/12/2021 12:30 PM Medical Record Number: [id number] Patient Account Number: [id number] Date of Birth/Sex: 10-15-1934 (86 y.o. M) Treating RN: Dolan Amen Primary Care Provider: Crissie Figures Other Clinician: Referring Provider: Crissie Figures Treating Provider/Extender: Skipper Cliche in Treatment: 17 History of Present Illness HPI Description: 10/10/2020 upon evaluation today patient presents  for initial evaluation in our clinic concerning a wound he has on the left lateral foot. 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 With that being said currently he has a wound portion of his left foot that is what we are seeing him for today everything else appears to actually be doing quite well which is great news. There does not appear to be any signs of active infection at this time which is also great news. He does have a history of diabetes mellitus type 2, chronic kidney disease stage III, hypertension, paroxysmal atrial fibrillation, and long-term use of anticoagulant therapy due to the atrial fibrillation. He does not have any pain secondary to neuropathy. His most recent hemoglobin A1c was 6.5 roughly a year ago as best we can tell. He does have [facility] coming out currently to help take care of his wound. 10/24/2020 upon evaluation today patient appears to be doing well with regard to his wound. In fact I am very pleased with how  things appear to be doing at this time. That is the least of his concerns at this time. Overall I am extremely pleased with the progress has been made. With that being said my biggest concern at this point he has actually not to do with his wound and other his blood pressure which is significantly elevated today. I am not certain that he has been taking his blood pressure upon further questioning with the patient and his family in fact I am almost certain that he is not. We did attempt to call his primary care provider but unfortunately we were unable to get them on the phone. The phone just rang. Nonetheless I think that he has an appointment with the cardiologist on Monday they can obviously recheck his blood pressure then. He also needs to get an appointment with his primary care provider likely to discuss his blood pressures. But also if he is not taking the medicine taking the medicine could obviously make a big difference in his blood pressure as it stands as well. 01/01/2021 upon evaluation today patient appears to be doing decently well in regard to his foot ulcer. He is going require some sharp debridement is actually been quite sometime since have seen him I think the main issue here is that he unfortunately was having some issues with transportation and just not feeling well. Either way I think that we need to definitely get things back in order and seeing him on a regular basis. The patient voiced understanding. With that being said his wound is going require sharp debridement at this point. 01/15/2021 upon evaluation today patient appears to be doing well with regard to his lateral foot ulcer. Unfortunately he has a new wound on his left second toe which appears to be something that rubbed. He tells me that this is something that happened as a result of him clipping his toenails. Again I am not exactly sure how that would have occurred however. Nonetheless I think this is a significant issue  here that is going to require some potential intervention. 5/27; his lateral foot wound over the fifth met head is closed. He still has an area on the tip of the left second toe although this appears to be smaller than measured last time it certainly does not probe to bone he  has a hammertoe in this area. He is wearing a surgical shoe 02/12/2021 upon evaluation today patient appears to be doing well with regard to the wounds on his foot. Both the left second toe as well as the left lateral foot both are still open the lateral foot I had to reopen as it appeared there was some fluid collecting underneath of a bit of callus. That does mean that we can have to remove this so that we can see this completely heal. Electronic Signature(s) Signed: 02/13/2021 7:56:06 AM By: Worthy Keeler PA-C Entered By: Worthy Keeler on 02/13/2021 07:56:06 Soules, Eric Form ([id number]) -------------------------------------------------------------------------------- Physical Exam Details Patient Name: PATRICK, [name] L. Date of Service: 02/12/2021 12:30 PM Medical Record Number: [id number] Patient Account Number: [id number] Date of Birth/Sex: 1935-05-11 (86 y.o. M) Treating RN: Dolan Amen Primary Care Provider: Crissie Figures Other Clinician: Referring Provider: Crissie Figures Treating Provider/Extender: Skipper Cliche in Treatment: 31 Constitutional Well-nourished and well-hydrated in no acute distress. Respiratory normal breathing without difficulty. Psychiatric this patient is able to make decisions and demonstrates good insight into disease process. Alert and Oriented x 3. pleasant and cooperative. Notes Upon inspection again I did perform sharp debridement over the lateral foot the patient tolerated this debridement today and subsequently this is still open although it was a very small area 0.2 x 0.2 cm. Obviously I think that we can get this healed fairly readily but I am glad we found it before  symptom became infected or worsened in general. Electronic Signature(s) Signed: 02/13/2021 7:56:29 AM By: Worthy Keeler PA-C Entered By: Worthy Keeler on 02/13/2021 07:56:29 [name] [name] ([id number]) -------------------------------------------------------------------------------- Physician Orders Details Patient Name: [name] [name]. Date of Service: 02/12/2021 12:30 PM Medical Record Number: [id number] Patient Account Number: [id number] Date of Birth/Sex: 12-02-1934 (86 y.o. M) Treating RN: Dolan Amen Primary Care Provider: Crissie Figures Other Clinician: Referring Provider: Crissie Figures Treating Provider/Extender: Skipper Cliche in Treatment: 17 Verbal / Phone Orders: No Diagnosis Coding ICD-10 Coding Code Description E11.621 Type 2 diabetes mellitus with foot ulcer L97.522 Non-pressure chronic ulcer of other part of left foot with fat layer exposed N18.30 Chronic kidney disease, stage 3 unspecified I10 Essential (primary) hypertension I48.0 Paroxysmal atrial fibrillation Z79.01 Long term (current) use of anticoagulants Follow-up Appointments o Return Appointment in 1 week. [place] for wound care. May utilize formulary equivalent dressing for wound treatment orders unless otherwise specified. Home Health Nurse may visit [facility] to address patientos wound care needs. - [facility] Bathing/ Shower/ Hygiene o Clean wound with Normal Saline or wound cleanser. o May shower with wound dressing protected with water repellent cover or cast protector. Edema Control - Lymphedema / Segmental Compressive Device / Other o Elevate, Exercise Daily and Avoid Standing for Long Periods of Time. o Elevate legs to the level of the heart and pump ankles as often as possible o Elevate leg(s) parallel to the floor when sitting. Non-Wound Condition o Additional non-wound orders/instructions: - apply foam protection to left lat foot  daily Off-Loading Bilateral Lower Extremities o Open toe surgical shoe Wound Treatment Wound #1R - Foot Wound Laterality: Left, Lateral Cleanser: Normal Saline 3 x Per Week/30 Days Discharge Instructions: Wash your hands with soap and water. Remove old dressing, discard into plastic bag and place into trash. Cleanse the wound with Normal Saline prior to applying a clean dressing using gauze sponges, not tissues or cotton balls. Do not scrub or use excessive force. Pat dry using  gauze sponges, not tissue or cotton balls. Primary Dressing: Silvercel Small 2x2 (in/in) 3 x Per Week/30 Days Discharge Instructions: Apply Silvercel Small 2x2 (in/in) as instructed Secondary Dressing: Mepilex Border Flex, 4x4 (in/in) 3 x Per Week/30 Days Discharge Instructions: Apply to wound as directed. Do not cut. Wound #2 - Toe Second Wound Laterality: Left Cleanser: Normal Saline 3 x Per Week/30 Days Discharge Instructions: Wash your hands with soap and water. Remove old dressing, discard into plastic bag and place into trash. Cleanse the wound with Normal Saline prior to applying a clean dressing using gauze sponges, not tissues or cotton balls. Do not scrub or use excessive force. Pat dry using gauze sponges, not tissue or cotton balls. Primary Dressing: Silvercel Small 2x2 (in/in) 3 x Per Week/30 Days Discharge Instructions: Apply Silvercel Small 2x2 (in/in) as instructed ZEVIN, NEVARES ([id number]) Secondary Dressing: Davis Dressing, 4x4 (in/in) 3 x Per Week/30 Days Discharge Instructions: Apply over dressing to secure in place. Electronic Signature(s) Signed: 02/12/2021 5:11:22 PM By: Georges Mouse, Minus Breeding RN Signed: 02/13/2021 8:18:42 AM By: Worthy Keeler PA-C Entered By: Georges Mouse, Minus Breeding on 02/12/2021 13:48:12 [name] [name] ([id number]) -------------------------------------------------------------------------------- Problem List Details Patient Name: [name] [name], [name] [name]. Date of Service: 02/12/2021 12:30 PM Medical Record Number: [id number] Patient Account Number: [id number] Date of Birth/Sex: [birth year] (86 y.o. M) Treating RN: Dolan Amen Primary Care Provider: Crissie Figures Other Clinician: Referring Provider: Crissie Figures Treating Provider/Extender: Skipper Cliche in Treatment: 17 Active Problems ICD-10 Encounter Code Description Active Date MDM Diagnosis E11.621 Type 2 diabetes mellitus with foot ulcer 10/10/2020 No Yes L97.522 Non-pressure chronic ulcer of other part of left foot with fat layer 10/10/2020 No Yes exposed N18.30 Chronic kidney disease, stage 3 unspecified 10/10/2020 No Yes I10 Essential (primary) hypertension 10/10/2020 No Yes I48.0 Paroxysmal atrial fibrillation 10/10/2020 No Yes Z79.01 Long term (current) use of anticoagulants 10/10/2020 No Yes Inactive Problems Resolved Problems Electronic Signature(s) Signed: 02/12/2021 1:37:47 PM By: Worthy Keeler PA-C Entered By: Worthy Keeler on 02/12/2021 13:37:47 Priola, Eric Form ([id number]) -------------------------------------------------------------------------------- Progress Note Details Patient Name: [name] [name]. Date of Service: 02/12/2021 12:30 PM Medical Record Number: [id number] Patient Account Number: [id number] Date of Birth/Sex: [birth year] (86 y.o. M) Treating RN: Dolan Amen Primary Care Provider: Crissie Figures Other Clinician: Referring Provider: Crissie Figures Treating Provider/Extender: Skipper Cliche in Treatment: 17 Subjective Chief Complaint Information obtained from Patient Left foot ulcer History of Present Illness (HPI) 10/10/2020 upon evaluation today patient presents for initial evaluation in our clinic concerning a wound he has on the left lateral foot. 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 With that being said currently he has a wound portion of his left foot that is what we are seeing him for today everything else appears to actually be doing quite well which is great news. There does not appear to be any signs of active infection at this time which is also great news. He does have a history of diabetes mellitus type 2, chronic kidney disease stage III, hypertension, paroxysmal atrial fibrillation, and long-term use of anticoagulant therapy due to the atrial fibrillation. He does not have any pain secondary to neuropathy. His most recent hemoglobin A1c was 6.5 roughly a year ago as best we can tell. He does have [facility] coming out currently  to help take care of his wound. 10/24/2020 upon evaluation today patient appears to be doing well with regard to his wound. In fact I am very pleased with how things appear to be doing at this time. That is the least of his concerns at this time. Overall I am extremely pleased with the progress has been made. With that being said my biggest concern at this point he has actually not to do with his wound and other his blood pressure which is significantly elevated today. I am not certain that he has been taking his blood pressure upon further questioning with the patient and his family in fact I am almost certain that he is not. We did attempt to call his primary care provider but unfortunately we were unable to get them on the phone. The phone just rang. Nonetheless I think that he has an appointment with the cardiologist on Monday they can obviously recheck his blood pressure then. He also needs to get an appointment with his primary care provider likely to discuss his blood pressures. But also if he is not taking the medicine taking the medicine could obviously make a big difference in his blood pressure as it stands as well. 01/01/2021 upon evaluation today patient appears to be doing decently well in regard to his foot  ulcer. He is going require some sharp debridement is actually been quite sometime since have seen him I think the main issue here is that he unfortunately was having some issues with transportation and just not feeling well. Either way I think that we need to definitely get things back in order and seeing him on a regular basis. The patient voiced understanding. With that being said his wound is going require sharp debridement at this point. 01/15/2021 upon evaluation today patient appears to be doing well with regard to his lateral foot ulcer. Unfortunately he has a new wound on his left second toe which appears to be something that rubbed. He tells me that this is something that happened as a result of him clipping his toenails. Again I am not exactly sure how that would have occurred however. Nonetheless I think this is a significant issue here that is going to require some potential intervention. 5/27; his lateral foot wound over the fifth met head is closed. He still has an area on the tip of the left second toe although this appears to be smaller than measured last time it certainly does not probe to bone he has a hammertoe in this area. He is wearing a surgical shoe 02/12/2021 upon evaluation today patient appears to be doing well with regard to the wounds on his foot. Both the left second toe as well as the left lateral foot both are still open the lateral foot I had to reopen as it appeared there was some fluid collecting underneath of a bit of callus. That does mean that we can have to remove this so that we can see this completely heal. Objective Constitutional Well-nourished and well-hydrated in no acute distress. Vitals Time Taken: 12:45 PM, Height: 70 in, Weight: 151 lbs, BMI: 21.7, Temperature: 98.2 F, Pulse: 52 bpm, Respiratory Rate: 16 breaths/min, Blood Pressure: 192/86 mmHg. Respiratory normal breathing without difficulty. Psychiatric this patient is able to make decisions and  demonstrates good insight into disease process. Alert and Oriented x 3. pleasant and cooperative. MANOAH, DECKARD ([id number]) General Notes: Upon inspection again I did perform sharp debridement over the lateral foot the patient tolerated this debridement  today and subsequently this is still open although it was a very small area 0.2 x 0.2 cm. Obviously I think that we can get this healed fairly readily but I am glad we found it before symptom became infected or worsened in general. Integumentary (Hair, Skin) Wound #1R status is Open. Original cause of wound was Gradually Appeared. The date acquired was: 09/14/2019. The wound has been in treatment 17 weeks. The wound is located on the Left,Lateral Foot. The wound measures 0.2cm length x 0.2cm width x 0.1cm depth; 0.031cm^2 area and 0.003cm^3 volume. There is Fat Layer (Subcutaneous Tissue) exposed. There is no tunneling or undermining noted. There is a small amount of serous drainage noted. There is large (67-100%) red granulation within the wound bed. There is no necrotic tissue within the wound bed. Wound #2 status is Open. Original cause of wound was Gradually Appeared. The date acquired was: 01/12/2021. The wound has been in treatment 4 weeks. The wound is located on the Left Toe Second. The wound measures 0.3cm length x 0.2cm width x 0.1cm depth; 0.047cm^2 area and 0.005cm^3 volume. There is Fat Layer (Subcutaneous Tissue) exposed. There is no tunneling or undermining noted. There is a medium amount of serous drainage noted. There is medium (34-66%) pink granulation within the wound bed. There is a medium (34-66%) amount of necrotic tissue within the wound bed including Adherent Slough. Assessment Active Problems ICD-10 Type 2 diabetes mellitus with foot ulcer Non-pressure chronic ulcer of other part of left foot with fat layer exposed Chronic kidney disease, stage 3 unspecified Essential (primary) hypertension Paroxysmal atrial  fibrillation Long term (current) use of anticoagulants Procedures Wound #1R Pre-procedure diagnosis of Wound #1R is a Diabetic Wound/Ulcer of the Lower Extremity located on the Left,Lateral Foot .Severity of Tissue Pre Debridement is: Limited to breakdown of skin. There was a Selective/Open Wound Non-Viable Tissue Debridement with a total area of 0.04 sq cm performed by Tommie Sams., PA-C. With the following instrument(s): Curette to remove Non-Viable tissue/material. Material removed includes Callus. A time out was conducted at 13:45, prior to the start of the procedure. A Minimum amount of bleeding was controlled with Pressure. The procedure was tolerated well. Post Debridement Measurements: 0.2cm length x 0.2cm width x 0.1cm depth; 0.003cm^3 volume. Character of Wound/Ulcer Post Debridement is stable. Severity of Tissue Post Debridement is: Limited to breakdown of skin. Post procedure Diagnosis Wound #1R: Same as Pre-Procedure Wound #2 Pre-procedure diagnosis of Wound #2 is a Diabetic Wound/Ulcer of the Lower Extremity located on the Left Toe Second .Severity of Tissue Pre Debridement is: Fat layer exposed. There was a Excisional Skin/Subcutaneous Tissue Debridement with a total area of 0.06 sq cm performed by Tommie Sams., PA-C. With the following instrument(s): Curette to remove Viable and Non-Viable tissue/material. Material removed includes Subcutaneous Tissue, Slough, and Skin: Epidermis. A time out was conducted at 13:45, prior to the start of the procedure. A Minimum amount of bleeding was controlled with Pressure. The procedure was tolerated well. Post Debridement Measurements: 0.3cm length x 0.2cm width x 0.2cm depth; 0.009cm^3 volume. Character of Wound/Ulcer Post Debridement is stable. Severity of Tissue Post Debridement is: Fat layer exposed. Post procedure Diagnosis Wound #2: Same as Pre-Procedure Plan Follow-up Appointments: Return Appointment in 1 week. Home  Health: [facility] for [facility] care. May utilize formulary equivalent dressing for wound treatment orders unless otherwise specified. Home Health Nurse may visit [facility] to address patient s wound care needs. - [facility] Bathing/ Shower/ Hygiene: Clean wound with  Normal Saline or wound cleanser. May shower with wound dressing protected with water repellent cover or cast protector. Edema Control - Lymphedema / Segmental Compressive Device / Other: DOLPHUS, LINCH. ([id number]) Elevate, Exercise Daily and Avoid Standing for Long Periods of Time. Elevate legs to the level of the heart and pump ankles as often as possible Elevate leg(s) parallel to the floor when sitting. Non-Wound Condition: Additional non-wound orders/instructions: - apply foam protection to left lat foot daily Off-Loading: Open toe surgical shoe WOUND #1R: - Foot Wound Laterality: Left, Lateral Cleanser: Normal Saline 3 x Per Week/30 Days Discharge Instructions: Wash your hands with soap and water. Remove old dressing, discard into plastic bag and place into trash. Cleanse the wound with Normal Saline prior to applying a clean dressing using gauze sponges, not tissues or cotton balls. Do not scrub or use excessive force. Pat dry using gauze sponges, not tissue or cotton balls. Primary Dressing: Silvercel Small 2x2 (in/in) 3 x Per Week/30 Days Discharge Instructions: Apply Silvercel Small 2x2 (in/in) as instructed Secondary Dressing: Mepilex Border Flex, 4x4 (in/in) 3 x Per Week/30 Days Discharge Instructions: Apply to wound as directed. Do not cut. WOUND #2: - Toe Second Wound Laterality: Left Cleanser: Normal Saline 3 x Per Week/30 Days Discharge Instructions: Wash your hands with soap and water. Remove old dressing, discard into plastic bag and place into trash. Cleanse the wound with Normal Saline prior to applying a clean dressing using gauze sponges, not tissues or cotton balls. Do not scrub or use excessive  force. Pat dry using gauze sponges, not tissue or cotton balls. Primary Dressing: Silvercel Small 2x2 (in/in) 3 x Per Week/30 Days Discharge Instructions: Apply Silvercel Small 2x2 (in/in) as instructed Secondary Dressing: [place] Dressing, 4x4 (in/in) 3 x Per Week/30 Days Discharge Instructions: Apply over dressing to secure in place. 1. Would recommend currently that we go ahead and continue currently with the silver cell I think this is doing a good job and will continue as such. 2. I am also can recommend a Telfa island dressing to cover. 3. I would also suggest that we continue to monitor for any signs of worsening infection. Obviously if anything changes patient will let me know. We will see patient back for reevaluation in 1 week here in the clinic. If anything worsens or changes patient will contact our office for additional recommendations. Electronic Signature(s) Signed: 02/13/2021 7:57:08 AM By: Worthy Keeler PA-C Entered By: Worthy Keeler on 02/13/2021 07:57:08 Aldava, Eric Form ([id number]) -------------------------------------------------------------------------------- SuperBill Details Patient Name: [name] [name] Date of Service: 02/12/2021 Medical Record Number: [id number] Patient Account Number: [id number] Date of Birth/Sex: [birth year] (86 y.o. 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 N18.30 Chronic kidney disease, stage 3 unspecified I10 Essential (primary) hypertension I48.0 Paroxysmal atrial fibrillation Z79.01 Long term (current) use of anticoagulants Facility Procedures CPT4 Code: 21975883 Description: 25498 - DEB SUBQ  TISSUE 20 SQ CM/< Modifier: Quantity: 1 CPT4 Code: Description: ICD-10 Diagnosis Description L97.522 Non-pressure chronic ulcer of other part of left foot with fat layer exp Modifier: osed Quantity: Physician Procedures CPT4 Code: 2641583 Description: 11042 - WC PHYS SUBQ TISS 20 SQ CM Modifier: Quantity: 1 CPT4 Code: Description: ICD-10 Diagnosis Description L97.522 Non-pressure chronic ulcer  of other part of left foot with fat layer exp Modifier: osed Quantity: Electronic Signature(s) Signed: 02/12/2021 4:46:53 PM By: Worthy Keeler PA-C Entered By: Worthy Keeler on 02/12/2021 16:46:53

## 2021-02-12 NOTE — Progress Notes (Signed)
[name] [name] ([id number]) Visit Report for 02/12/2021 Arrival Information Details Patient Name: [name] [name], [name] [name]. Date of Service: 02/12/2021 12:30 PM Medical Record Number: [id number] Patient Account Number: [id number] Date of Birth/Sex: 1935/04/01 (86 y.o. M) Treating RN: Donnamarie Poag Primary Care Eudell Mcphee: Crissie Figures Other Clinician: Referring Detric Scalisi: Crissie Figures Treating Yolando Gillum/Extender: Skipper Cliche in Treatment: 17 Visit Information History Since Last Visit Added or deleted any medications: No Patient Arrived: Kasandra Knudsen Had a fall or experienced change in No Arrival Time: 12:45 activities of daily living that may affect Accompanied By: self risk of falls: Transfer Assistance: None Hospitalized since last visit: No Patient Identification Verified: Yes Has Dressing in Place as Prescribed: Yes Secondary Verification Process Completed: Yes Pain Present Now: No Patient Requires Transmission-Based No Precautions: Patient Has Alerts: Yes Patient Alerts: Patient on Blood Thinner NON COMPRESSABLE Electronic Signature(s) Signed: 02/12/2021 3:41:48 PM By: Donnamarie Poag Entered By: Donnamarie Poag on 02/12/2021 12:44:31 Shaneyfelt, Eric Form ([id number]) -------------------------------------------------------------------------------- Clinic Level of Care Assessment Details Patient Name: [name] [name]. Date of Service: 02/12/2021 12:30 PM Medical Record Number: [id number] Patient Account Number: [id number] Date of Birth/Sex: May 31, 1935 (86 y.o. M) Treating RN: Dolan Amen Primary Care Gabriele Loveland: Crissie Figures Other Clinician: Referring Kianah Harries: Crissie Figures Treating Pinki Rottman/Extender: Skipper Cliche in Treatment: 17 Clinic Level of Care Assessment Items TOOL 1 Quantity Score []  - Use when EandM and Procedure is performed on INITIAL visit 0 ASSESSMENTS - Nursing Assessment / Reassessment []  - General Physical Exam (combine w/ comprehensive assessment (listed  just below) when performed on new 0 pt. evals) []  - 0 Comprehensive Assessment (HX, ROS, Risk Assessments, Wounds Hx, etc.) ASSESSMENTS - Wound and Skin Assessment / Reassessment []  - Dermatologic / Skin Assessment (not related to wound area) 0 ASSESSMENTS - Ostomy and/or Continence Assessment and Care []  - Incontinence Assessment and Management 0 []  - 0 Ostomy Care Assessment and Management (repouching, etc.) PROCESS - Coordination of Care []  - Simple Patient / Family Education for ongoing care 0 []  - 0 Complex (extensive) Patient / Family Education for ongoing care []  - 0 Staff obtains Programmer, systems, Records, Test Results / Process Orders []  - 0 Staff telephones HHA, Nursing Homes / Clarify orders / etc []  - 0 Routine Transfer to another Facility (non-emergent condition) []  - 0 Routine Hospital Admission (non-emergent condition) []  - 0 New Admissions / Biomedical engineer / Ordering NPWT, Apligraf, etc. []  - 0 Emergency Hospital Admission (emergent condition) PROCESS - Special Needs []  - Pediatric / Minor Patient Management 0 []  - 0 Isolation Patient Management []  - 0 Hearing / Language / Visual special needs []  - 0 Assessment of Community assistance (transportation, D/C planning, etc.) []  - 0 Additional assistance / Altered mentation []  - 0 Support Surface(s) Assessment (bed, cushion, seat, etc.) INTERVENTIONS - Miscellaneous []  - External ear exam 0 []  - 0 Patient Transfer (multiple staff / Civil Service fast streamer / Similar devices) []  - 0 Simple Staple / Suture removal (25 or less) []  - 0 Complex Staple / Suture removal (26 or more) []  - 0 Hypo/Hyperglycemic Management (do not check if billed separately) []  - 0 Ankle / Brachial Index (ABI) - do not check if billed separately Has the patient been seen at the hospital within the last three years: Yes Total Score: 0 Level Of Care: ____ [name] [name] ([id number]) Electronic Signature(s) Signed: 02/12/2021 5:11:22 PM By:  Georges Mouse, Minus Breeding RN Entered By: Georges Mouse, Minus Breeding on 02/12/2021 13:45:57 [name] [name] ([id number]) -------------------------------------------------------------------------------- Encounter Discharge Information Details Patient  Name: [name] [name]. Date of Service: 02/12/2021 12:30 PM Medical Record Number: [id number] Patient Account Number: [id number] Date of Birth/Sex: 07/25/1935 (86 y.o. M) Treating RN: Donnamarie Poag Primary Care Rielyn Krupinski: Crissie Figures Other Clinician: Referring Aluna Whiston: Crissie Figures Treating Kenecia Barren/Extender: Skipper Cliche in Treatment: 17 Encounter Discharge Information Items Post Procedure Vitals Discharge Condition: Stable Temperature (F): 97.8 Ambulatory Status: Cane Pulse (bpm): 53 Discharge Destination: Home Respiratory Rate (breaths/min): 18 Transportation: Private Auto Blood Pressure (mmHg): 192/86 Accompanied By: self Schedule Follow-up Appointment: Yes Clinical Summary of Care: Electronic Signature(s) Signed: 02/12/2021 3:41:48 PM By: Donnamarie Poag Entered By: Donnamarie Poag on 02/12/2021 13:56:21 Ludemann, Eric Form ([id number]) -------------------------------------------------------------------------------- Lower Extremity Assessment Details Patient Name: [name] [name]. Date of Service: 02/12/2021 12:30 PM Medical Record Number: [id number] Patient Account Number: [id number] Date of Birth/Sex: 04/06/1935 (86 y.o. M) Treating RN: Donnamarie Poag Primary Care Joshawn Crissman: Crissie Figures Other Clinician: Referring Brace Welte: Crissie Figures Treating Kolt Mcwhirter/Extender: Jeri Cos Weeks in Treatment: 17 Edema Assessment Assessed: [Left: Yes] [Right: No] Edema: [Left: N] [Right: o] Vascular Assessment Pulses: Dorsalis Pedis Palpable: [Left:Yes] Electronic Signature(s) Signed: 02/12/2021 3:41:48 PM By: Donnamarie Poag Entered By: Donnamarie Poag on 02/12/2021 12:53:39 Wisener, Eric Form  ([id number]) -------------------------------------------------------------------------------- Multi Wound Chart Details Patient Name: [name] [name] Buttner L. Date of Service: 02/12/2021 12:30 PM Medical Record Number: [id number] Patient Account Number: [id number] Date of Birth/Sex: Sep 05, 1935 (86 y.o. M) Treating RN: Dolan Amen Primary Care Jhoel Stieg: Crissie Figures Other Clinician: Referring Dejia Ebron: Crissie Figures Treating Chauntae Hults/Extender: Skipper Cliche in Treatment: 17 Vital Signs Height(in): 70 Pulse(bpm): 52 Weight(lbs): 151 Blood Pressure(mmHg): 192/86 Body Mass Index(BMI): 22 Temperature(F): 98.2 Respiratory Rate(breaths/min): 16 Photos: [N/A:N/A] Wound Location: Left Toe Second N/A N/A Wounding Event: Gradually Appeared N/A N/A Primary Etiology: Diabetic Wound/Ulcer of the Lower N/A N/A Extremity Comorbid History: Chronic Obstructive Pulmonary N/A N/A Disease (COPD), Congestive Heart Failure, Hypertension, Myocardial Infarction, Type II Diabetes Date Acquired: 01/12/2021 N/A N/A Weeks of Treatment: 4 N/A N/A Wound Status: Open N/A N/A Measurements L x W x D (cm) 0.3x0.2x0.1 N/A N/A Area (cm) : 0.047 N/A N/A Volume (cm) : 0.005 N/A N/A % Reduction in Area: 94.00% N/A N/A % Reduction in Volume: 93.70% N/A N/A Classification: Grade 2 N/A N/A Exudate Amount: Medium N/A N/A Exudate Type: Serous N/A N/A Exudate Color: amber N/A N/A Granulation Amount: Medium (34-66%) N/A N/A Granulation Quality: Pink N/A N/A Necrotic Amount: Medium (34-66%) N/A N/A Exposed Structures: Fat Layer (Subcutaneous Tissue): N/A N/A Yes Fascia: No Tendon: No Muscle: No Joint: No Bone: No Epithelialization: Small (1-33%) N/A N/A Treatment Notes Electronic Signature(s) Signed: 02/12/2021 5:11:22 PM By: Georges Mouse, Minus Breeding RN Entered By: Georges Mouse, Minus Breeding on 02/12/2021 13:43:10 [name] [name]  ([id number]) -------------------------------------------------------------------------------- Multi-Disciplinary Care Plan Details Patient Name: [name] [name]. Date of Service: 02/12/2021 12:30 PM Medical Record Number: [id number] Patient Account Number: [id number] Date of Birth/Sex: 1935/01/26 (85 y.o. M) Treating RN: Dolan Amen Primary Care Sonia Bromell: Crissie Figures Other Clinician: Referring Constantin Hillery: Crissie Figures Treating Remell Giaimo/Extender: Skipper Cliche in Treatment: 17 Active Inactive Wound/Skin Impairment Nursing Diagnoses: Knowledge deficit related to ulceration/compromised skin integrity Goals: Patient/caregiver will verbalize understanding of skin care regimen Date Initiated: 10/10/2020 Date Inactivated: 01/01/2021 Target Resolution Date: 11/10/2020 Goal Status: Met Ulcer/skin breakdown will have a volume reduction of 30% by week 4 Date Initiated: 10/10/2020 Target Resolution Date: 11/10/2020 Goal Status: Active Ulcer/skin breakdown will have a volume reduction of 50% by week 8 Date Initiated: 10/10/2020 Target Resolution Date: 12/08/2020 Goal Status: Active Ulcer/skin breakdown will have a  volume reduction of 80% by week 12 Date Initiated: 10/10/2020 Target Resolution Date: 01/08/2021 Goal Status: Active Ulcer/skin breakdown will heal within 14 weeks Date Initiated: 10/10/2020 Target Resolution Date: 02/07/2021 Goal Status: Active Interventions: Assess patient/caregiver ability to obtain necessary supplies Assess patient/caregiver ability to perform ulcer/skin care regimen upon admission and as needed Assess ulceration(s) every visit Notes: Electronic Signature(s) Signed: 02/12/2021 5:11:22 PM By: Georges Mouse, Minus Breeding RN Entered By: Georges Mouse, Minus Breeding on 02/12/2021 13:43:04 [name] [name] ([id number]) -------------------------------------------------------------------------------- Pain Assessment Details Patient Name: [name] [name]. Date of  Service: 02/12/2021 12:30 PM Medical Record Number: [id number] Patient Account Number: [id number] Date of Birth/Sex: 1934/12/01 (86 y.o. M) Treating RN: Donnamarie Poag Primary Care Conner Neiss: Crissie Figures Other Clinician: Referring Aslan Himes: Crissie Figures Treating Tip Atienza/Extender: Skipper Cliche in Treatment: 17 Active Problems Location of Pain Severity and Description of Pain Patient Has Paino No Site Locations Rate the pain. Current Pain Level: 0 Pain Management and Medication Current Pain Management: Electronic Signature(s) Signed: 02/12/2021 3:41:48 PM By: Donnamarie Poag Entered By: Donnamarie Poag on 02/12/2021 12:51:09 [name] [name] ([id number]) -------------------------------------------------------------------------------- Patient/Caregiver Education Details Patient Name: [name] [name]. Date of Service: 02/12/2021 12:30 PM Medical Record Number: [id number] Patient Account Number: [id number] Date of Birth/Gender: 1935-04-26 (86 y.o. M) Treating RN: Dolan Amen Primary Care Physician: Crissie Figures Other Clinician: Referring Physician: Crissie Figures Treating Physician/Extender: Skipper Cliche in Treatment: 17 Education Assessment Education Provided To: Patient Education Topics Provided Wound/Skin Impairment: Methods: Explain/Verbal Responses: State content correctly Electronic Signature(s) Signed: 02/12/2021 5:11:22 PM By: Georges Mouse, Minus Breeding RN Entered By: Georges Mouse, Minus Breeding on 02/12/2021 13:46:16 [name] [name] ([id number]) -------------------------------------------------------------------------------- Wound Assessment Details Patient Name: MAESON, PUROHIT. Date of Service: 02/12/2021 12:30 PM Medical Record Number: [id number] Patient Account Number: [id number] Date of Birth/Sex: 05/19/1935 (86 y.o. M) Treating RN: Dolan Amen Primary Care Jaspreet Bodner: Crissie Figures Other Clinician: Referring Tamyka Bezio: Crissie Figures Treating  Jayce Boyko/Extender: Skipper Cliche in Treatment: 17 Wound Status Wound Number: 1R Primary Diabetic Wound/Ulcer of the Lower Extremity Etiology: Wound Location: Left, Lateral Foot Wound Open Wounding Event: Gradually Appeared Status: Date Acquired: 09/14/2019 Comorbid Chronic Obstructive Pulmonary Disease (COPD), Weeks Of Treatment: 17 History: Congestive Heart Failure, Hypertension, Myocardial Clustered Wound: No Infarction, Type II Diabetes Wound Measurements Length: (cm) 0.2 Width: (cm) 0.2 Depth: (cm) 0.1 Area: (cm) 0.031 Volume: (cm) 0.003 % Reduction in Area: 99.1% % Reduction in Volume: 99.5% Epithelialization: None Tunneling: No Undermining: No Wound Description Classification: Grade 2 Exudate Amount: Small Exudate Type: Serous Exudate Color: amber Foul Odor After Cleansing: No Slough/Fibrino No Wound Bed Granulation Amount: Large (67-100%) Exposed Structure Granulation Quality: Red Fascia Exposed: No Necrotic Amount: None Present (0%) Fat Layer (Subcutaneous Tissue) Exposed: Yes Tendon Exposed: No Muscle Exposed: No Joint Exposed: No Bone Exposed: No Treatment Notes Wound #1R (Foot) Wound Laterality: Left, Lateral Cleanser Normal Saline Discharge Instruction: Wash your hands with soap and water. Remove old dressing, discard into plastic bag and place into trash. Cleanse the wound with Normal Saline prior to applying a clean dressing using gauze sponges, not tissues or cotton balls. Do not scrub or use excessive force. Pat dry using gauze sponges, not tissue or cotton balls. Peri-Wound Care Topical Primary Dressing Silvercel Small 2x2 (in/in) Discharge Instruction: Apply Silvercel Small 2x2 (in/in) as instructed Secondary Dressing Mepilex Border Flex, 4x4 (in/in) Discharge Instruction: Apply to wound as directed. Do not cut. Secured With [employer] ([id number]) Compression Stockings Add-Ons Electronic  Signature(s) Signed: 02/12/2021 5:11:22 PM By: Laurin Coder  Staci Acosta RN Entered By: Georges Mouse, Minus Breeding on 02/12/2021 13:47:42 [place], Eric Form ([id number]) -------------------------------------------------------------------------------- Wound Assessment Details Patient Name: LORAS, GRIESHOP. Date of Service: 02/12/2021 12:30 PM Medical Record Number: [id number] Patient Account Number: [id number] Date of Birth/Sex: 1934-12-14 (86 y.o. M) Treating RN: Donnamarie Poag Primary Care Muhsin Doris: Crissie Figures Other Clinician: Referring Dinnis Rog: Crissie Figures Treating Aneesh Faller/Extender: Skipper Cliche in Treatment: 17 Wound Status Wound Number: 2 Primary Diabetic Wound/Ulcer of the Lower Extremity Etiology: Wound Location: Left Toe Second Wound Open Wounding Event: Gradually Appeared Status: Date Acquired: 01/12/2021 Comorbid Chronic Obstructive Pulmonary Disease (COPD), Weeks Of Treatment: 4 History: Congestive Heart Failure, Hypertension, Myocardial Clustered Wound: No Infarction, Type II Diabetes Photos Wound Measurements Length: (cm) 0.3 Width: (cm) 0.2 Depth: (cm) 0.1 Area: (cm) 0.047 Volume: (cm) 0.005 % Reduction in Area: 94% % Reduction in Volume: 93.7% Epithelialization: Small (1-33%) Tunneling: No Undermining: No Wound Description Classification: Grade 2 Exudate Amount: Medium Exudate Type: Serous Exudate Color: amber Foul Odor After Cleansing: No Slough/Fibrino Yes Wound Bed Granulation Amount: Medium (34-66%) Exposed Structure Granulation Quality: Pink Fascia Exposed: No Necrotic Amount: Medium (34-66%) Fat Layer (Subcutaneous Tissue) Exposed: Yes Necrotic Quality: Adherent Slough Tendon Exposed: No Muscle Exposed: No Joint Exposed: No Bone Exposed: No Treatment Notes Wound #2 (Toe Second) Wound Laterality: Left Cleanser Normal Saline Discharge Instruction: Wash your hands with soap and water. Remove old dressing, discard into plastic bag and  place into trash. Cleanse the wound with Normal Saline prior to applying a clean dressing using gauze sponges, not tissues or cotton balls. Do not scrub or use excessive force. Pat dry using gauze sponges, not tissue or cotton balls. [place], [place] ([id number]) Peri-Wound Care Topical Primary Dressing Silvercel Small 2x2 (in/in) Discharge Instruction: Apply Silvercel Small 2x2 (in/in) as instructed Secondary Dressing Telfa Adhesive Island Dressing, 4x4 (in/in) Discharge Instruction: Apply over dressing to secure in place. Secured With Compression Wrap Compression Stockings Add-Ons Electronic Signature(s) Signed: 02/12/2021 3:41:48 PM By: Donnamarie Poag Entered By: Donnamarie Poag on 02/12/2021 12:53:04 [name] [name] ([id number]) -------------------------------------------------------------------------------- Vitals Details Patient Name: [name] [name]. Date of Service: 02/12/2021 12:30 PM Medical Record Number: [id number] Patient Account Number: [id number] Date of Birth/Sex: [birth year] (86 y.o. M) Treating RN: Donnamarie Poag Primary Care Zaryia Markel: Crissie Figures Other Clinician: Referring Vinetta Brach: Crissie Figures Treating Lissett Favorite/Extender: Skipper Cliche in Treatment: 17 Vital Signs Time Taken: 12:45 Temperature (F): 98.2 Height (in): 70 Pulse (bpm): 52 Weight (lbs): 151 Respiratory Rate (breaths/min): 16 Body Mass Index (BMI): 21.7 Blood Pressure (mmHg): 192/86 Reference Range: 80 - 120 mg / dl Electronic Signature(s) Signed: 02/12/2021 3:41:48 PM By: Donnamarie Poag Entered ByDonnamarie Poag on 02/12/2021 12:50:57

## 2021-02-19 ENCOUNTER — Encounter: Payer: Medicare Other | Admitting: Physician Assistant

## 2021-02-19 ENCOUNTER — Other Ambulatory Visit: Payer: Self-pay

## 2021-02-19 DIAGNOSIS — Z09 Encounter for follow-up examination after completed treatment for conditions other than malignant neoplasm: Secondary | ICD-10-CM | POA: Diagnosis not present

## 2021-02-19 NOTE — Progress Notes (Addendum)
[name], [name] ([id number]) Visit Report for 02/19/2021 Chief Complaint Document Details Patient Name: [name] [name], [name] [name]. Date of Service: 02/19/2021 12:30 PM Medical Record Number: [id number] Patient Account Number: [id number] Date of Birth/Sex: 09-15-1934 (86 y.o. M) Treating RN: Dolan Amen Primary Care Provider: Crissie Figures Other Clinician: Referring Provider: Crissie Figures Treating Provider/Extender: Skipper Cliche in Treatment: 18 Information Obtained from: Patient Chief Complaint Left foot ulcer Electronic Signature(s) Signed: 02/19/2021 1:06:41 PM By: Worthy Keeler PA-C Entered By: Worthy Keeler on 02/19/2021 13:06:40 [name] [name] ([id number]) -------------------------------------------------------------------------------- Debridement Details Patient Name: [name] [name] Date of Service: 02/19/2021 12:30 PM Medical Record Number: [id number] Patient Account Number: [id number] Date of Birth/Sex: 1935-02-22 (86 y.o. M) Treating RN: Dolan Amen Primary Care Provider: Crissie Figures Other Clinician: Referring Provider: Crissie Figures Treating Provider/Extender: Skipper Cliche in Treatment: 18 Debridement Performed for Wound #1R Left,Lateral Foot Assessment: Performed By: Physician Tommie Sams., PA-C Debridement Type: Debridement Severity of Tissue Pre Debridement: Fat layer exposed Level of Consciousness (Pre- Awake and Alert procedure): Pre-procedure Verification/Time Out Yes - 13:11 Taken: Start Time: 13:11 Total Area Debrided (L x W): 0.2 (cm) x 0.2 (cm) = 0.04 (cm) Tissue and other material Non-Viable, Skin: Epidermis, Fibrin/Exudate debrided: Level: Skin/Epidermis Debridement Description: Selective/Open Wound Instrument: Curette Bleeding: None Response to Treatment: Procedure was tolerated well Level of Consciousness (Post- Awake and Alert procedure): Post Debridement Measurements of Total Wound Length: (cm) 0.1 Width: (cm)  0.1 Depth: (cm) 0.1 Volume: (cm) 0.001 Character of Wound/Ulcer Post Debridement: Stable Severity of Tissue Post Debridement: Fat layer exposed Post Procedure Diagnosis Same as Pre-procedure Electronic Signature(s) Signed: 02/19/2021 4:29:08 PM By: Charlett Nose RN Signed: 02/19/2021 5:38:58 PM By: Worthy Keeler PA-C Entered By: Georges Mouse, Minus Breeding on 02/19/2021 13:12:30 [name] [name] ([id number]) -------------------------------------------------------------------------------- HPI Details Patient Name: [name] [name]. Date of Service: 02/19/2021 12:30 PM Medical Record Number: [id number] Patient Account Number: [id number] Date of Birth/Sex: May 30, 1935 (86 y.o. M) Treating RN: Dolan Amen Primary Care Provider: Crissie Figures Other Clinician: Referring Provider: Crissie Figures Treating Provider/Extender: Skipper Cliche in Treatment: 18 History of Present Illness HPI Description: 10/10/2020 upon evaluation today patient presents for initial evaluation in our clinic concerning a wound he has on the left lateral foot. 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 With that being said currently he has a wound portion of his left foot that is what we are seeing him for today everything else appears to actually be doing quite well which is great news. There does not appear to be any signs of active infection at this time which is also great news. He does have a history of diabetes mellitus type 2, chronic kidney disease stage III, hypertension, paroxysmal atrial fibrillation, and long-term use of anticoagulant therapy due to the atrial fibrillation. He does not have any pain secondary to neuropathy. His most recent hemoglobin A1c was 6.5 roughly a year ago as best we can tell. He does have [facility] coming  out currently to help take care of his wound. 10/24/2020 upon evaluation today patient appears to be doing well with regard to his wound. In fact I am very pleased with how things appear to be doing at this time. That is the least of his concerns at this time. Overall I am extremely pleased with the  progress has been made. With that being said my biggest concern at this point he has actually not to do with his wound and other his blood pressure which is significantly elevated today. I am not certain that he has been taking his blood pressure upon further questioning with the patient and his family in fact I am almost certain that he is not. We did attempt to call his primary care provider but unfortunately we were unable to get them on the phone. The phone just rang. Nonetheless I think that he has an appointment with the cardiologist on Monday they can obviously recheck his blood pressure then. He also needs to get an appointment with his primary care provider likely to discuss his blood pressures. But also if he is not taking the medicine taking the medicine could obviously make a big difference in his blood pressure as it stands as well. 01/01/2021 upon evaluation today patient appears to be doing decently well in regard to his foot ulcer. He is going require some sharp debridement is actually been quite sometime since have seen him I think the main issue here is that he unfortunately was having some issues with transportation and just not feeling well. Either way I think that we need to definitely get things back in order and seeing him on a regular basis. The patient voiced understanding. With that being said his wound is going require sharp debridement at this point. 01/15/2021 upon evaluation today patient appears to be doing well with regard to his lateral foot ulcer. Unfortunately he has a new wound on his left second toe which appears to be something that rubbed. He tells me that this is  something that happened as a result of him clipping his toenails. Again I am not exactly sure how that would have occurred however. Nonetheless I think this is a significant issue here that is going to require some potential intervention. 5/27; his lateral foot wound over the fifth met head is closed. He still has an area on the tip of the left second toe although this appears to be smaller than measured last time it certainly does not probe to bone he has a hammertoe in this area. He is wearing a surgical shoe 02/12/2021 upon evaluation today patient appears to be doing well with regard to the wounds on his foot. Both the left second toe as well as the left lateral foot both are still open the lateral foot I had to reopen as it appeared there was some fluid collecting underneath of a bit of callus. That does mean that we can have to remove this so that we can see this completely heal. 02/19/2021 upon evaluation today patient's wounds are both showing signs of improvement. I am very pleased with regard to what we see. I do think that he is managing quite nicely as far as the wound is concerned overall I feel like that the lateral foot wound is going require little bit of sharp debridement but again nothing too significant here. Electronic Signature(s) Signed: 02/19/2021 1:29:18 PM By: Worthy Keeler PA-C Entered By: Worthy Keeler on 02/19/2021 13:29:18 [name] [name] ([id number]) -------------------------------------------------------------------------------- Physical Exam Details Patient Name: ZAKERY, NORMINGTON. Date of Service: 02/19/2021 12:30 PM Medical Record Number: [id number] Patient Account Number: [id number] Date of Birth/Sex: 27-May-1935 (86 y.o. M) Treating RN: Dolan Amen Primary Care Provider: Crissie Figures Other Clinician: Referring Provider: Crissie Figures Treating Provider/Extender: Skipper Cliche in Treatment: 95 Constitutional Well-nourished and well-hydrated in no  acute distress. Respiratory normal breathing without difficulty. Psychiatric this patient is able to make decisions and demonstrates good insight into disease process. Alert and Oriented x 3. pleasant and cooperative. Notes I do not see any evidence of active infection which is great news and overall very pleased with where things stand. I did perform sharp debridement clear away some of the callus from the surface of the wound on the lateral foot although I did not have to do much in regard to the toe this is good news. Postdebridement the wound bed appeared to be doing great it was just a very small pinpoint opening. Upon inspection patient's wound bed actually showed signs of good granulation epithelization at this point. Electronic Signature(s) Signed: 02/19/2021 1:30:19 PM By: Worthy Keeler PA-C Entered By: Worthy Keeler on 02/19/2021 13:30:19 [name] [name] ([id number]) -------------------------------------------------------------------------------- Physician Orders Details Patient Name: YOHANN, CURL. Date of Service: 02/19/2021 12:30 PM Medical Record Number: [id number] Patient Account Number: [id number] Date of Birth/Sex: 12-16-1934 (86 y.o. M) Treating RN: Dolan Amen Primary Care Provider: Crissie Figures Other Clinician: Referring Provider: Crissie Figures Treating Provider/Extender: Skipper Cliche in Treatment: 24 Verbal / Phone Orders: No Diagnosis Coding ICD-10 Coding Code Description E11.621 Type 2 diabetes mellitus with foot ulcer L97.522 Non-pressure chronic ulcer of other part of left foot with fat layer exposed N18.30 Chronic kidney disease, stage 3 unspecified I10 Essential (primary) hypertension I48.0 Paroxysmal atrial fibrillation Z79.01 Long term (current) use of anticoagulants Follow-up Appointments o Return Appointment in 1 week. [facility] for wound care. May utilize formulary equivalent dressing for wound  treatment orders unless otherwise specified. Home Health Nurse may visit [facility] to address patientos wound care needs. - [facility] Bathing/ Shower/ Hygiene o Clean wound with Normal Saline or wound cleanser. o May shower with wound dressing protected with water repellent cover or cast protector. Edema Control - Lymphedema / Segmental Compressive Device / Other o Elevate, Exercise Daily and Avoid Standing for Long Periods of Time. o Elevate legs to the level of the heart and pump ankles as often as possible o Elevate leg(s) parallel to the floor when sitting. Non-Wound Condition o Additional non-wound orders/instructions: - apply foam protection to left lat foot daily Off-Loading Bilateral Lower Extremities o Open toe surgical shoe Wound Treatment Wound #1R - Foot Wound Laterality: Left, Lateral Cleanser: Normal Saline 3 x Per Week/30 Days Discharge Instructions: Wash your hands with soap and water. Remove old dressing, discard into plastic bag and place into trash. Cleanse the wound with Normal Saline prior to applying a clean dressing using gauze sponges, not tissues or cotton balls. Do not scrub or use excessive force. Pat dry using gauze sponges, not tissue or cotton balls. Primary Dressing: Silvercel Small 2x2 (in/in) 3 x Per Week/30 Days Discharge Instructions: Apply Silvercel Small 2x2 (in/in) as instructed Secondary Dressing: Mepilex Border Flex, 4x4 (in/in) 3 x Per Week/30 Days Discharge Instructions: Apply to wound as directed. Do not cut. Wound #2 - Toe Second Wound Laterality: Left Cleanser: Normal Saline 3 x Per Week/30 Days Discharge Instructions: Wash your hands with soap and water. Remove old dressing, discard into plastic bag and place into trash. Cleanse the wound with Normal Saline prior to applying a clean dressing using gauze sponges, not tissues or cotton balls. Do not scrub or use excessive force. Pat dry using gauze sponges, not tissue or cotton  balls. Primary Dressing: Silvercel Small 2x2 (in/in) 3 x Per Week/30 Days Discharge Instructions: Apply Silvercel  Small 2x2 (in/in) as instructed GUY, SEESE ([id number]) Secondary Dressing: [place] Dressing, 4x4 (in/in) 3 x Per Week/30 Days Discharge Instructions: Apply over dressing to secure in place. Electronic Signature(s) Signed: 02/19/2021 4:29:08 PM By: Georges Mouse, Minus Breeding RN Signed: 02/19/2021 5:38:58 PM By: Worthy Keeler PA-C Entered By: Georges Mouse, Minus Breeding on 02/19/2021 13:12:50 [name] [name] ([id number]) -------------------------------------------------------------------------------- Problem List Details Patient Name: MACEN, JOSLIN. Date of Service: 02/19/2021 12:30 PM Medical Record Number: [id number] Patient Account Number: [id number] Date of Birth/Sex: May 22, 1935 (86 y.o. M) Treating RN: Dolan Amen Primary Care Provider: Crissie Figures Other Clinician: Referring Provider: Crissie Figures Treating Provider/Extender: Skipper Cliche in Treatment: 18 Active Problems ICD-10 Encounter Code Description Active Date MDM Diagnosis E11.621 Type 2 diabetes mellitus with foot ulcer 10/10/2020 No Yes L97.522 Non-pressure chronic ulcer of other part of left foot with fat layer 10/10/2020 No Yes exposed N18.30 Chronic kidney disease, stage 3 unspecified 10/10/2020 No Yes I10 Essential (primary) hypertension 10/10/2020 No Yes I48.0 Paroxysmal atrial fibrillation 10/10/2020 No Yes Z79.01 Long term (current) use of anticoagulants 10/10/2020 No Yes Inactive Problems Resolved Problems Electronic Signature(s) Signed: 02/19/2021 1:06:34 PM By: Worthy Keeler PA-C Entered By: Worthy Keeler on 02/19/2021 13:06:33 [name] [name] ([id number]) -------------------------------------------------------------------------------- Progress Note Details Patient Name: [name] [name]. Date of Service: 02/19/2021 12:30 PM Medical Record Number:  [id number] Patient Account Number: [id number] Date of Birth/Sex: 06-10-1935 (86 y.o. M) Treating RN: Dolan Amen Primary Care Provider: Crissie Figures Other Clinician: Referring Provider: Crissie Figures Treating Provider/Extender: Skipper Cliche in Treatment: 18 Subjective Chief Complaint Information obtained from Patient Left foot ulcer History of Present Illness (HPI) 10/10/2020 upon evaluation today patient presents for initial evaluation in our clinic concerning a wound he has on the left lateral foot. 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 With that being said currently he has a wound portion of his left foot that is what we are seeing him for today everything else appears to actually be doing quite well which is great news. There does not appear to be any signs of active infection at this time which is also great news. He does have a history of diabetes mellitus type 2, chronic kidney disease stage III, hypertension, paroxysmal atrial fibrillation, and long-term use of anticoagulant therapy due to the atrial fibrillation. He does not have any pain secondary to neuropathy. His most recent hemoglobin A1c was 6.5 roughly a year ago as best we can tell. He does have [facility] coming out currently to help take care of his wound. 10/24/2020 upon evaluation today patient appears to be doing well with regard to his wound. In fact I am very pleased with how things appear to be doing at this time. That is the least of his concerns at this time. Overall I am extremely pleased with the progress has been made. With that being said my biggest concern at this point he has actually not to do with his wound and other his blood pressure which is significantly elevated today. I am not certain that he has been taking his blood pressure upon  further questioning with the patient and his family in fact I am almost certain that he is not. We did attempt to call his primary care provider but unfortunately we were unable to get them on  the phone. The phone just rang. Nonetheless I think that he has an appointment with the cardiologist on Monday they can obviously recheck his blood pressure then. He also needs to get an appointment with his primary care provider likely to discuss his blood pressures. But also if he is not taking the medicine taking the medicine could obviously make a big difference in his blood pressure as it stands as well. 01/01/2021 upon evaluation today patient appears to be doing decently well in regard to his foot ulcer. He is going require some sharp debridement is actually been quite sometime since have seen him I think the main issue here is that he unfortunately was having some issues with transportation and just not feeling well. Either way I think that we need to definitely get things back in order and seeing him on a regular basis. The patient voiced understanding. With that being said his wound is going require sharp debridement at this point. 01/15/2021 upon evaluation today patient appears to be doing well with regard to his lateral foot ulcer. Unfortunately he has a new wound on his left second toe which appears to be something that rubbed. He tells me that this is something that happened as a result of him clipping his toenails. Again I am not exactly sure how that would have occurred however. Nonetheless I think this is a significant issue here that is going to require some potential intervention. 5/27; his lateral foot wound over the fifth met head is closed. He still has an area on the tip of the left second toe although this appears to be smaller than measured last time it certainly does not probe to bone he has a hammertoe in this area. He is wearing a surgical shoe 02/12/2021 upon evaluation today patient  appears to be doing well with regard to the wounds on his foot. Both the left second toe as well as the left lateral foot both are still open the lateral foot I had to reopen as it appeared there was some fluid collecting underneath of a bit of callus. That does mean that we can have to remove this so that we can see this completely heal. 02/19/2021 upon evaluation today patient's wounds are both showing signs of improvement. I am very pleased with regard to what we see. I do think that he is managing quite nicely as far as the wound is concerned overall I feel like that the lateral foot wound is going require little bit of sharp debridement but again nothing too significant here. Objective Constitutional Well-nourished and well-hydrated in no acute distress. Vitals Time Taken: 12:39 PM, Height: 70 in, Weight: 151 lbs, BMI: 21.7, Temperature: 98.5 F, Pulse: 88 bpm, Respiratory Rate: 20 breaths/min, Blood Pressure: 170/90 mmHg. Respiratory normal breathing without difficulty. NIELS, CRANSHAW ([id number]) Psychiatric this patient is able to make decisions and demonstrates good insight into disease process. Alert and Oriented x 3. pleasant and cooperative. General Notes: I do not see any evidence of active infection which is great news and overall very pleased with where things stand. I did perform sharp debridement clear away some of the callus from the surface of the wound on the lateral foot although I did not have to do much in regard to the toe this is good news. Postdebridement the wound bed appeared to be doing great it was just a very small pinpoint opening. Upon inspection patient's wound bed actually showed signs of good granulation epithelization at this point. Integumentary (Hair, Skin)  Wound #1R status is Open. Original cause of wound was Gradually Appeared. The date acquired was: 09/14/2019. The wound has been in treatment 18 weeks. The wound is located on the Left,Lateral Foot. The  wound measures 0.2cm length x 0.2cm width x 0.1cm depth; 0.031cm^2 area and 0.003cm^3 volume. There is Fat Layer (Subcutaneous Tissue) exposed. There is no tunneling or undermining noted. There is a small amount of serous drainage noted. There is small (1-33%) red granulation within the wound bed. There is a large (67-100%) amount of necrotic tissue within the wound bed including Adherent Slough. Wound #2 status is Open. Original cause of wound was Gradually Appeared. The date acquired was: 01/12/2021. The wound has been in treatment 5 weeks. The wound is located on the Left Toe Second. The wound measures 0.3cm length x 0.2cm width x 0.1cm depth; 0.047cm^2 area and 0.005cm^3 volume. There is Fat Layer (Subcutaneous Tissue) exposed. There is no tunneling or undermining noted. There is a medium amount of serous drainage noted. There is small (1-33%) pink granulation within the wound bed. There is a large (67-100%) amount of necrotic tissue within the wound bed. Assessment Active Problems ICD-10 Type 2 diabetes mellitus with foot ulcer Non-pressure chronic ulcer of other part of left foot with fat layer exposed Chronic kidney disease, stage 3 unspecified Essential (primary) hypertension Paroxysmal atrial fibrillation Long term (current) use of anticoagulants Procedures Wound #1R Pre-procedure diagnosis of Wound #1R is a Diabetic Wound/Ulcer of the Lower Extremity located on the Left,Lateral Foot .Severity of Tissue Pre Debridement is: Fat layer exposed. There was a Selective/Open Wound Skin/Epidermis Debridement with a total area of 0.04 sq cm performed by Tommie Sams., PA-C. With the following instrument(s): Curette to remove Non-Viable tissue/material. Material removed includes Skin: Epidermis and Fibrin/Exudate and. A time out was conducted at 13:11, prior to the start of the procedure. There was no bleeding. The procedure was tolerated well. Post Debridement Measurements: 0.1cm length x  0.1cm width x 0.1cm depth; 0.001cm^3 volume. Character of Wound/Ulcer Post Debridement is stable. Severity of Tissue Post Debridement is: Fat layer exposed. Post procedure Diagnosis Wound #1R: Same as Pre-Procedure Plan Follow-up Appointments: Return Appointment in 1 week. Home Health: [facility] for wound care. May utilize formulary equivalent dressing for wound treatment orders unless otherwise specified. Home Health Nurse may visit [facility]N to address patient s wound care needs. - [facility] Bathing/ Shower/ Hygiene: Clean wound with Normal Saline or wound cleanser. May shower with wound dressing protected with water repellent cover or cast protector. Edema Control - Lymphedema / Segmental Compressive Device / Other: Elevate, Exercise Daily and Avoid Standing for Long Periods of Time. Elevate legs to the level of the heart and pump ankles as often as possible Elevate leg(s) parallel to the floor when sitting. Non-Wound Condition: Additional non-wound orders/instructions: - apply foam protection to left lat foot daily Off-Loading: TERRELL, OSTRAND. ([id number]) Open toe surgical shoe WOUND #1R: - Foot Wound Laterality: Left, Lateral Cleanser: Normal Saline 3 x Per Week/30 Days Discharge Instructions: Wash your hands with soap and water. Remove old dressing, discard into plastic bag and place into trash. Cleanse the wound with Normal Saline prior to applying a clean dressing using gauze sponges, not tissues or cotton balls. Do not scrub or use excessive force. Pat dry using gauze sponges, not tissue or cotton balls. Primary Dressing: Silvercel Small 2x2 (in/in) 3 x Per Week/30 Days Discharge Instructions: Apply Silvercel Small 2x2 (in/in) as instructed Secondary Dressing: Mepilex Border Flex, 4x4 (in/in)  3 x Per Week/30 Days Discharge Instructions: Apply to wound as directed. Do not cut. WOUND #2: - Toe Second Wound Laterality: Left Cleanser: Normal Saline 3 x Per Week/30  Days Discharge Instructions: Wash your hands with soap and water. Remove old dressing, discard into plastic bag and place into trash. Cleanse the wound with Normal Saline prior to applying a clean dressing using gauze sponges, not tissues or cotton balls. Do not scrub or use excessive force. Pat dry using gauze sponges, not tissue or cotton balls. Primary Dressing: Silvercel Small 2x2 (in/in) 3 x Per Week/30 Days Discharge Instructions: Apply Silvercel Small 2x2 (in/in) as instructed Secondary Dressing: [place] Dressing, 4x4 (in/in) 3 x Per Week/30 Days Discharge Instructions: Apply over dressing to secure in place. 1. Would recommend currently that we go ahead and continue with the silver alginate dressing I think this is probably still the best way to go and the patient is in agreement with that plan. 2. I am also can recommend that we have the patient continue to monitor for any signs of infection if he notices anything that seems to be out of the ordinary should let me know sooner although we will plan to recheck with him in about a week anyways. We will see patient back for reevaluation in 1 week here in the clinic. If anything worsens or changes patient will contact our office for additional recommendations. Electronic Signature(s) Signed: 02/19/2021 1:46:32 PM By: Worthy Keeler PA-C Entered By: Worthy Keeler on 02/19/2021 13:46:32 Stucker, Eric Form ([id number]) -------------------------------------------------------------------------------- SuperBill Details Patient Name: [name] [name] Date of Service: 02/19/2021 Medical Record Number: [id number] Patient Account Number: [id number] Date of Birth/Sex: 1935-02-13 (86 y.o. 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 N18.30 Chronic kidney disease, stage 3 unspecified I10 Essential (primary) hypertension I48.0 Paroxysmal atrial fibrillation Z79.01 Long term (current) use of anticoagulants Facility Procedures CPT4 Code: 15400867 Description: [phone] - DEBRIDE WOUND 1ST 20 SQ CM OR < Modifier: Quantity: 1 CPT4 Code: Description: ICD-10 Diagnosis Description L97.522 Non-pressure chronic ulcer of other part of left foot with fat layer expo Modifier: sed Quantity: Physician Procedures CPT4 Code: 9326712 Description: 45809 - WC PHYS DEBR WO ANESTH 20 SQ CM Modifier: Quantity: 1 CPT4 Code: Description: ICD-10 Diagnosis Description L97.522 Non-pressure chronic ulcer of other part of left foot with fat layer expo Modifier: sed Quantity: Electronic Signature(s) Signed: 02/19/2021 1:46:47 PM By: Worthy Keeler PA-C Entered By: Worthy Keeler on 02/19/2021 13:46:47

## 2021-02-20 NOTE — Progress Notes (Signed)
[name] [name], [name] [name] ([id number]) Visit Report for 02/19/2021 Arrival Information Details Patient Name: [name] [name]. Date of Service: 02/19/2021 12:30 PM Medical Record Number: [id number] Patient Account Number: [id number] Date of Birth/Sex: 1934-10-03 (86 y.o. M) Treating RN: Carlene Coria Primary Care Towana Stenglein: Crissie Figures Other Clinician: Referring Gerell Fortson: Crissie Figures Treating Arslan Kier/Extender: Skipper Cliche in Treatment: 58 Visit Information History Since Last Visit All ordered tests and consults were completed: No Patient Arrived: Ambulatory Added or deleted any medications: No Arrival Time: 12:34 Any new allergies or adverse reactions: No Accompanied By: self Had a fall or experienced change in No Transfer Assistance: None activities of daily living that may affect Patient Identification Verified: Yes risk of falls: Secondary Verification Process Completed: Yes Signs or symptoms of abuse/neglect since last visito No Patient Requires Transmission-Based No Hospitalized since last visit: No Precautions: Implantable device outside of the clinic excluding No Patient Has Alerts: Yes cellular tissue based products placed in the center Patient Alerts: Patient on Blood since last visit: Thinner Has Dressing in Place as Prescribed: Yes NON COMPRESSABLE Pain Present Now: No Electronic Signature(s) Signed: 02/20/2021 9:23:44 AM By: Carlene Coria RN Entered By: Carlene Coria on 02/19/2021 12:39:31 [name] [name] ([id number]) -------------------------------------------------------------------------------- Clinic Level of Care Assessment Details Patient Name: [name] [name]. Date of Service: 02/19/2021 12:30 PM Medical Record Number: [id number] Patient Account Number: [id number] Date of Birth/Sex: 10-29-1934 (86 y.o. M) Treating RN: Dolan Amen Primary Care Deshonda Cryderman: Crissie Figures Other Clinician: Referring Tracye Szuch: Crissie Figures Treating  Shakim Faith/Extender: Skipper Cliche in Treatment: 18 Clinic Level of Care Assessment Items TOOL 1 Quantity Score []  - Use when EandM and Procedure is performed on INITIAL visit 0 ASSESSMENTS - Nursing Assessment / Reassessment []  - General Physical Exam (combine w/ comprehensive assessment (listed just below) when performed on new 0 pt. evals) []  - 0 Comprehensive Assessment (HX, ROS, Risk Assessments, Wounds Hx, etc.) ASSESSMENTS - Wound and Skin Assessment / Reassessment []  - Dermatologic / Skin Assessment (not related to wound area) 0 ASSESSMENTS - Ostomy and/or Continence Assessment and Care []  - Incontinence Assessment and Management 0 []  - 0 Ostomy Care Assessment and Management (repouching, etc.) PROCESS - Coordination of Care []  - Simple Patient / Family Education for ongoing care 0 []  - 0 Complex (extensive) Patient / Family Education for ongoing care []  - 0 Staff obtains Programmer, systems, Records, Test Results / Process Orders []  - 0 Staff telephones HHA, Nursing Homes / Clarify orders / etc []  - 0 Routine Transfer to another Facility (non-emergent condition) []  - 0 Routine Hospital Admission (non-emergent condition) []  - 0 New Admissions / Biomedical engineer / Ordering NPWT, Apligraf, etc. []  - 0 Emergency Hospital Admission (emergent condition) PROCESS - Special Needs []  - Pediatric / Minor Patient Management 0 []  - 0 Isolation Patient Management []  - 0 Hearing / Language / Visual special needs []  - 0 Assessment of Community assistance (transportation, D/C planning, etc.) []  - 0 Additional assistance / Altered mentation []  - 0 Support Surface(s) Assessment (bed, cushion, seat, etc.) INTERVENTIONS - Miscellaneous []  - External ear exam 0 []  - 0 Patient Transfer (multiple staff / Civil Service fast streamer / Similar devices) []  - 0 Simple Staple / Suture removal (25 or less) []  - 0 Complex Staple / Suture removal (26 or more) []  - 0 Hypo/Hyperglycemic Management (do not  check if billed separately) []  - 0 Ankle / Brachial Index (ABI) - do not check if billed separately Has the patient been seen at the hospital within the  last three years: Yes Total Score: 0 Level Of Care: ____ [name] [name] ([id number]) Electronic Signature(s) Signed: 02/19/2021 4:29:08 PM By: Georges Mouse, Minus Breeding RN Entered By: Georges Mouse, Minus Breeding on 02/19/2021 13:12:56 [name] [name] ([id number]) -------------------------------------------------------------------------------- Encounter Discharge Information Details Patient Name: [name] [name], [name] [name]. Date of Service: 02/19/2021 12:30 PM Medical Record Number: [id number] Patient Account Number: [id number] Date of Birth/Sex: 12-29-1934 (86 y.o. M) Treating RN: Dolan Amen Primary Care Chakira Jachim: Crissie Figures Other Clinician: Referring Shalandra Leu: Crissie Figures Treating Malikah Lakey/Extender: Skipper Cliche in Treatment: 18 Encounter Discharge Information Items Post Procedure Vitals Discharge Condition: Stable Temperature (F): 98.5 Ambulatory Status: Cane Pulse (bpm): 88 Discharge Destination: Home Respiratory Rate (breaths/min): 20 Transportation: Private Auto Blood Pressure (mmHg): 170/90 Accompanied By: self Schedule Follow-up Appointment: Yes Clinical Summary of Care: Electronic Signature(s) Signed: 02/19/2021 4:32:45 PM By: Jeanine Luz Entered By: Jeanine Luz on 02/19/2021 13:22:17 [name] [name] ([id number]) -------------------------------------------------------------------------------- Lower Extremity Assessment Details Patient Name: [name] [name]. Date of Service: 02/19/2021 12:30 PM Medical Record Number: [id number] Patient Account Number: [id number] Date of Birth/Sex: 04-Sep-1935 (86 y.o. M) Treating RN: Carlene Coria Primary Care Aydden Cumpian: Crissie Figures Other Clinician: Referring Akili Cuda: Crissie Figures Treating Toni Demo/Extender: Skipper Cliche in Treatment: 18 Vascular  Assessment Pulses: Dorsalis Pedis Palpable: [Left:Yes] Electronic Signature(s) Signed: 02/20/2021 9:23:44 AM By: Carlene Coria RN Entered By: Carlene Coria on 02/19/2021 12:46:27 [name] [name] ([id number]) -------------------------------------------------------------------------------- Multi Wound Chart Details Patient Name: [name] [name]. Date of Service: 02/19/2021 12:30 PM Medical Record Number: [id number] Patient Account Number: [id number] Date of Birth/Sex: 1935/09/03 (86 y.o. M) Treating RN: Dolan Amen Primary Care Edrie Ehrich: Crissie Figures Other Clinician: Referring Gisselle Galvis: Crissie Figures Treating Kyesha Balla/Extender: Skipper Cliche in Treatment: 18 Vital Signs Height(in): 70 Pulse(bpm): 77 Weight(lbs): 151 Blood Pressure(mmHg): 170/90 Body Mass Index(BMI): 22 Temperature(F): 98.5 Respiratory Rate(breaths/min): 20 Photos: [N/A:N/A] Wound Location: Left, Lateral Foot Left Toe Second N/A Wounding Event: Gradually Appeared Gradually Appeared N/A Primary Etiology: Diabetic Wound/Ulcer of the Lower Diabetic Wound/Ulcer of the Lower N/A Extremity Extremity Comorbid History: Chronic Obstructive Pulmonary Chronic Obstructive Pulmonary N/A Disease (COPD), Congestive Heart Disease (COPD), Congestive Heart Failure, Hypertension, Myocardial Failure, Hypertension, Myocardial Infarction, Type II Diabetes Infarction, Type II Diabetes Date Acquired: 09/14/2019 01/12/2021 N/A Weeks of Treatment: 18 5 N/A Wound Status: Open Open N/A Wound Recurrence: Yes No N/A Measurements L x W x D (cm) 0.2x0.2x0.1 0.3x0.2x0.1 N/A Area (cm) : 0.031 0.047 N/A Volume (cm) : 0.003 0.005 N/A % Reduction in Area: 99.10% 94.00% N/A % Reduction in Volume: 99.50% 93.70% N/A Classification: Grade 2 Grade 2 N/A Exudate Amount: Small Medium N/A Exudate Type: Serous Serous N/A Exudate Color: amber amber N/A Granulation Amount: Small (1-33%) Small (1-33%) N/A Granulation Quality: Red Pink  N/A Necrotic Amount: Large (67-100%) Large (67-100%) N/A Exposed Structures: Fat Layer (Subcutaneous Tissue): Fat Layer (Subcutaneous Tissue): N/A Yes Yes Fascia: No Fascia: No Tendon: No Tendon: No Muscle: No Muscle: No Joint: No Joint: No Bone: No Bone: No Epithelialization: None Small (1-33%) N/A Treatment Notes Electronic Signature(s) Signed: 02/19/2021 4:29:08 PM By: Georges Mouse, Minus Breeding RN Entered By: Georges Mouse, Minus Breeding on 02/19/2021 13:11:34 [name] [name] ([id number]) -------------------------------------------------------------------------------- [place] Details Patient Name: [name] [name], [name] [name]. Date of Service: 02/19/2021 12:30 PM Medical Record Number: [id number] Patient Account Number: [id number] Date of Birth/Sex: 29-May-1935 (86 y.o. M) Treating RN: Dolan Amen Primary Care Daily Crate: Crissie Figures Other Clinician: Referring Agape Hardiman: Crissie Figures Treating Etter Royall/Extender: Skipper Cliche in Treatment: 18 Active Inactive Wound/Skin Impairment Nursing Diagnoses: Knowledge deficit  related to ulceration/compromised skin integrity Goals: Patient/caregiver will verbalize understanding of skin care regimen Date Initiated: 10/10/2020 Date Inactivated: 01/01/2021 Target Resolution Date: 11/10/2020 Goal Status: Met Ulcer/skin breakdown will have a volume reduction of 30% by week 4 Date Initiated: 10/10/2020 Target Resolution Date: 11/10/2020 Goal Status: Active Ulcer/skin breakdown will have a volume reduction of 50% by week 8 Date Initiated: 10/10/2020 Target Resolution Date: 12/08/2020 Goal Status: Active Ulcer/skin breakdown will have a volume reduction of 80% by week 12 Date Initiated: 10/10/2020 Target Resolution Date: 01/08/2021 Goal Status: Active Ulcer/skin breakdown will heal within 14 weeks Date Initiated: 10/10/2020 Target Resolution Date: 02/07/2021 Goal Status: Active Interventions: Assess patient/caregiver ability  to obtain necessary supplies Assess patient/caregiver ability to perform ulcer/skin care regimen upon admission and as needed Assess ulceration(s) every visit Notes: Electronic Signature(s) Signed: 02/19/2021 4:29:08 PM By: Georges Mouse, Minus Breeding RN Entered By: Georges Mouse, Minus Breeding on 02/19/2021 13:11:28 [name] [name] ([id number]) -------------------------------------------------------------------------------- Pain Assessment Details Patient Name: [name] [name]. Date of Service: 02/19/2021 12:30 PM Medical Record Number: [id number] Patient Account Number: [id number] Date of Birth/Sex: 1934/11/06 (86 y.o. M) Treating RN: Carlene Coria Primary Care Dulce Martian: Crissie Figures Other Clinician: Referring Elene Downum: Crissie Figures Treating Svara Twyman/Extender: Skipper Cliche in Treatment: 18 Active Problems Location of Pain Severity and Description of Pain Patient Has Paino No Site Locations Pain Management and Medication Current Pain Management: Electronic Signature(s) Signed: 02/20/2021 9:23:44 AM By: Carlene Coria RN Entered By: Carlene Coria on 02/19/2021 12:42:28 [name] [name] ([id number]) -------------------------------------------------------------------------------- Patient/Caregiver Education Details Patient Name: [name] [name]. Date of Service: 02/19/2021 12:30 PM Medical Record Number: [id number] Patient Account Number: [id number] Date of Birth/Gender: 1935/07/11 (86 y.o. M) Treating RN: Dolan Amen Primary Care Physician: Crissie Figures Other Clinician: Referring Physician: Crissie Figures Treating Physician/Extender: Skipper Cliche in Treatment: 18 Education Assessment Education Provided To: Patient Education Topics Provided Wound/Skin Impairment: Methods: Explain/Verbal Responses: State content correctly Electronic Signature(s) Signed: 02/19/2021 4:29:08 PM By: Georges Mouse, Minus Breeding RN Entered By: Georges Mouse, Minus Breeding on 02/19/2021  13:13:11 [name] [name] ([id number]) -------------------------------------------------------------------------------- Wound Assessment Details Patient Name: [name] [name], [name] [name]. Date of Service: 02/19/2021 12:30 PM Medical Record Number: [id number] Patient Account Number: [id number] Date of Birth/Sex: [birth year] (86 y.o. M) Treating RN: Carlene Coria Primary Care Hinda Lindor: Crissie Figures Other Clinician: Referring Kaiser Belluomini: Crissie Figures Treating Katreena Schupp/Extender: Skipper Cliche in Treatment: 18 Wound Status Wound Number: 1R Primary Diabetic Wound/Ulcer of the Lower Extremity Etiology: Wound Location: Left, Lateral Foot Wound Open Wounding Event: Gradually Appeared Status: Date Acquired: 09/14/2019 Comorbid Chronic Obstructive Pulmonary Disease (COPD), Weeks Of Treatment: 18 History: Congestive Heart Failure, Hypertension, Myocardial Clustered Wound: No Infarction, Type II Diabetes Photos Wound Measurements Length: (cm) 0.2 Width: (cm) 0.2 Depth: (cm) 0.1 Area: (cm) 0.031 Volume: (cm) 0.003 % Reduction in Area: 99.1% % Reduction in Volume: 99.5% Epithelialization: None Tunneling: No Undermining: No Wound Description Classification: Grade 2 Exudate Amount: Small Exudate Type: Serous Exudate Color: amber Foul Odor After Cleansing: No Slough/Fibrino Yes Wound Bed Granulation Amount: Small (1-33%) Exposed Structure Granulation Quality: Red Fascia Exposed: No Necrotic Amount: Large (67-100%) Fat Layer (Subcutaneous Tissue) Exposed: Yes Necrotic Quality: Adherent Slough Tendon Exposed: No Muscle Exposed: No Joint Exposed: No Bone Exposed: No Treatment Notes Wound #1R (Foot) Wound Laterality: Left, Lateral Cleanser Normal Saline Discharge Instruction: Wash your hands with soap and water. Remove old dressing, discard into plastic bag and place into trash. Cleanse the wound with Normal Saline prior to applying a clean dressing using gauze sponges, not tissues  or cotton balls. Do not scrub or use excessive force. Pat dry using gauze sponges, not tissue or cotton balls. [name] [name], [name] [name] ([id number]) Peri-Wound Care Topical Primary Dressing Silvercel Small 2x2 (in/in) Discharge Instruction: Apply Silvercel Small 2x2 (in/in) as instructed Secondary Dressing Mepilex Border Flex, 4x4 (in/in) Discharge Instruction: Apply to wound as directed. Do not cut. Secured With Compression Wrap Compression Stockings Add-Ons Electronic Signature(s) Signed: 02/20/2021 9:23:44 AM By: Carlene Coria RN Entered By: Carlene Coria on 02/19/2021 12:45:41 [name] [name] ([id number]) -------------------------------------------------------------------------------- Wound Assessment Details Patient Name: [name] [name]. Date of Service: 02/19/2021 12:30 PM Medical Record Number: [id number] Patient Account Number: [id number] Date of Birth/Sex: November 26, 1934 (86 y.o. M) Treating RN: Dolan Amen Primary Care Raelynn Corron: Crissie Figures Other Clinician: Referring Hila Bolding: Crissie Figures Treating Alann Avey/Extender: Skipper Cliche in Treatment: 18 Wound Status Wound Number: 2 Primary Diabetic Wound/Ulcer of the Lower Extremity Etiology: Wound Location: Left Toe Second Wound Open Wounding Event: Gradually Appeared Status: Date Acquired: 01/12/2021 Comorbid Chronic Obstructive Pulmonary Disease (COPD), Weeks Of Treatment: 5 History: Congestive Heart Failure, Hypertension, Myocardial Clustered Wound: No Infarction, Type II Diabetes Photos Wound Measurements Length: (cm) 0.3 Width: (cm) 0.2 Depth: (cm) 0.1 Area: (cm) 0.047 Volume: (cm) 0.005 % Reduction in Area: 94% % Reduction in Volume: 93.7% Epithelialization: Small (1-33%) Tunneling: No Undermining: No Wound Description Classification: Grade 2 Exudate Amount: Medium Exudate Type: Serous Exudate Color: amber Foul Odor After Cleansing: No Slough/Fibrino Yes Wound Bed Granulation Amount: Small  (1-33%) Exposed Structure Granulation Quality: Pink Fascia Exposed: No Necrotic Amount: Large (67-100%) Fat Layer (Subcutaneous Tissue) Exposed: Yes Tendon Exposed: No Muscle Exposed: No Joint Exposed: No Bone Exposed: No Treatment Notes Wound #2 (Toe Second) Wound Laterality: Left Cleanser Normal Saline Discharge Instruction: Wash your hands with soap and water. Remove old dressing, discard into plastic bag and place into trash. Cleanse the wound with Normal Saline prior to applying a clean dressing using gauze sponges, not tissues or cotton balls. Do not scrub or use excessive force. Pat dry using gauze sponges, not tissue or cotton balls. [name] [name], [name] [name] ([id number]) Peri-Wound Care Topical Primary Dressing Silvercel Small 2x2 (in/in) Discharge Instruction: Apply Silvercel Small 2x2 (in/in) as instructed Secondary Dressing Telfa Adhesive [place] Dressing, 4x4 (in/in) Discharge Instruction: Apply over dressing to secure in place. Secured With Compression Wrap Compression Stockings Add-Ons Electronic Signature(s) Signed: 02/19/2021 4:29:08 PM By: Georges Mouse, Minus Breeding RN Entered By: Georges Mouse, Minus Breeding on 02/19/2021 13:11:18 [name] [name] ([id number]) -------------------------------------------------------------------------------- Vitals Details Patient Name: [name] [name] Date of Service: 02/19/2021 12:30 PM Medical Record Number: [id number] Patient Account Number: [id number] Date of Birth/Sex: 08/05/1935 (86 y.o. M) Treating RN: Carlene Coria Primary Care Jelissa Espiritu: Crissie Figures Other Clinician: Referring Vinaya Sancho: Crissie Figures Treating Azarel Banner/Extender: Skipper Cliche in Treatment: 18 Vital Signs Time Taken: 12:39 Temperature (F): 98.5 Height (in): 70 Pulse (bpm): 88 Weight (lbs): 151 Respiratory Rate (breaths/min): 20 Body Mass Index (BMI): 21.7 Blood Pressure (mmHg): 170/90 Reference Range: 80 - 120 mg / dl Electronic  Signature(s) Signed: 02/20/2021 9:23:44 AM By: Carlene Coria RN Entered By: Carlene Coria on 02/19/2021 12:42:18

## 2021-02-26 ENCOUNTER — Ambulatory Visit: Payer: Medicare Other | Admitting: Physician Assistant

## 2021-03-03 IMAGING — CT CT HEAD W/O CM
3 series · 15 of 47 positions shown, 18 images · non-contrast
Comparison: CT head 12/16/2008

CLINICAL DATA: Altered mental status, increasing shortness of
breath,

EXAM:
CT HEAD WITHOUT CONTRAST
TECHNIQUE: Contiguous axial images were obtained from the base of the skull
through the vertex without intravenous contrast.

[Series 2: head wo · axial · 0.44mm/px · z∈[+392,+517]mm · 9 of 30 slices shown, 12 images]
[im 3/30  brain]
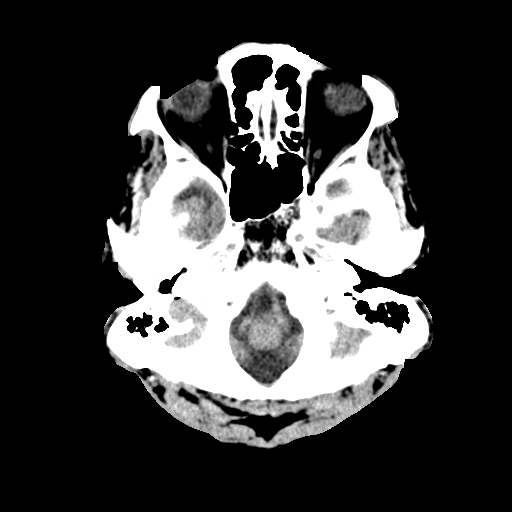
[im 3/30  bone]
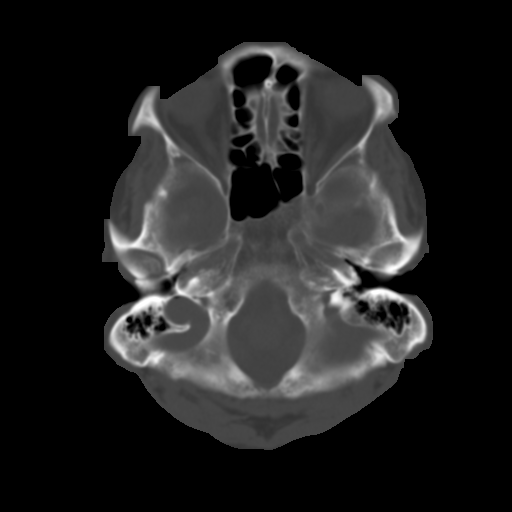
[im 6/30  brain]
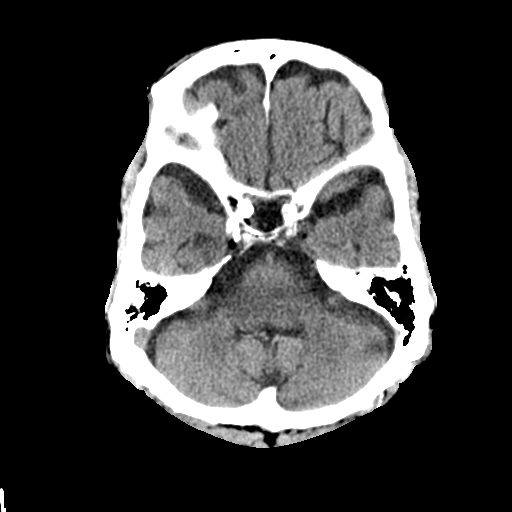
[im 9/30  brain]
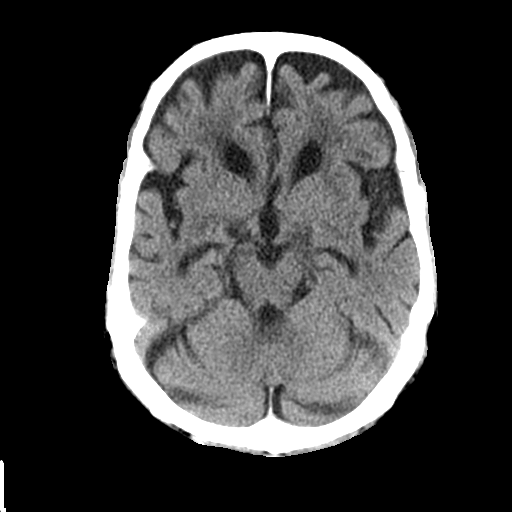
[im 12/30  brain]
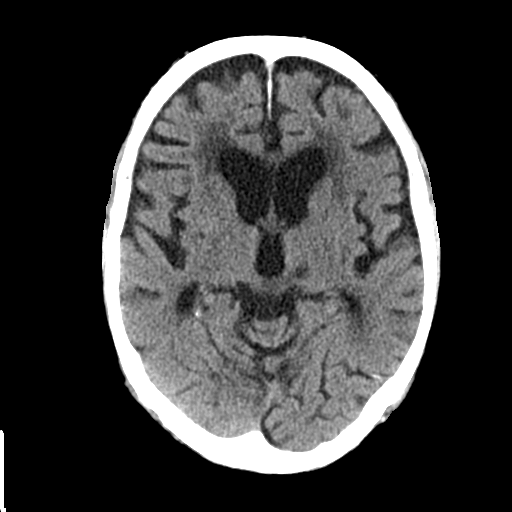
[im 16/30  brain]
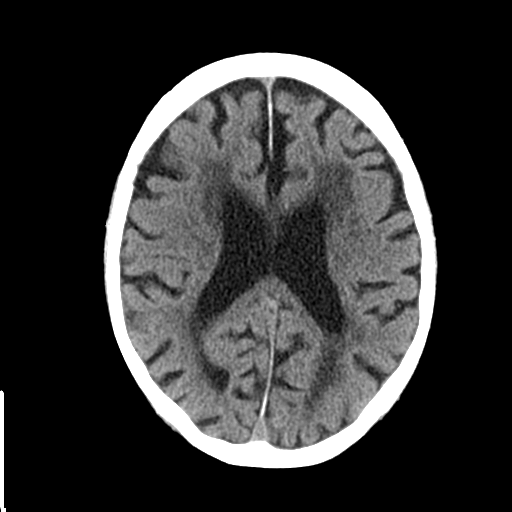
[im 16/30  bone]
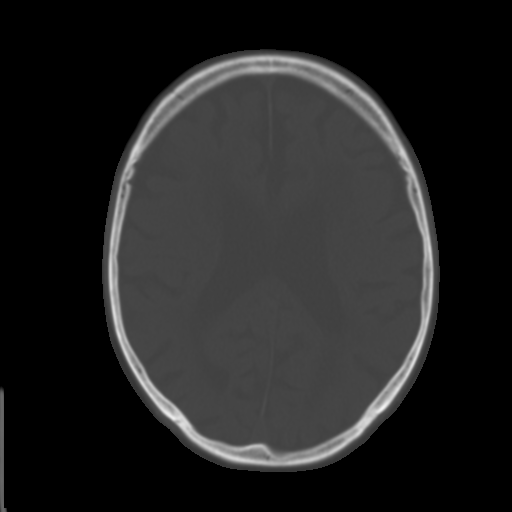
[im 19/30  brain]
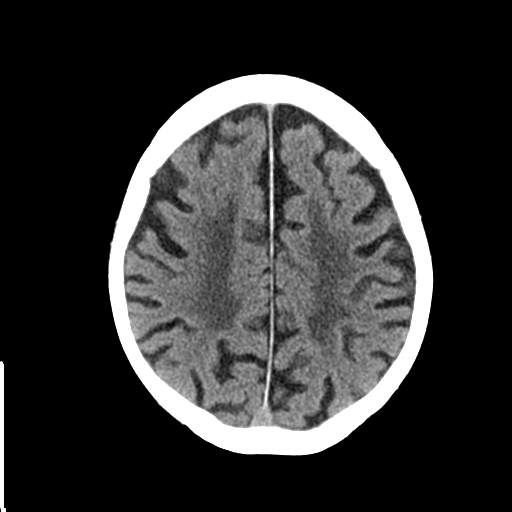
[im 22/30  brain]
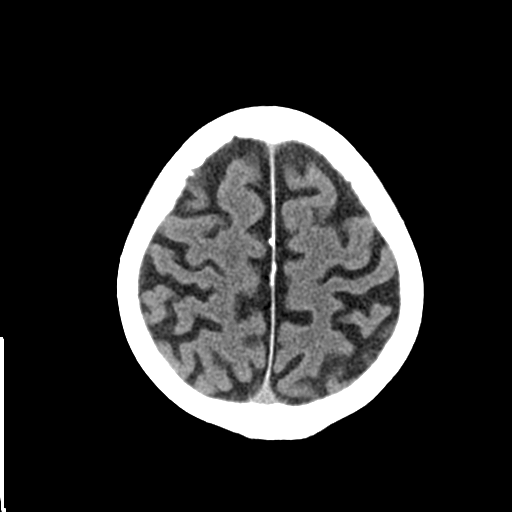
[im 25/30  brain]
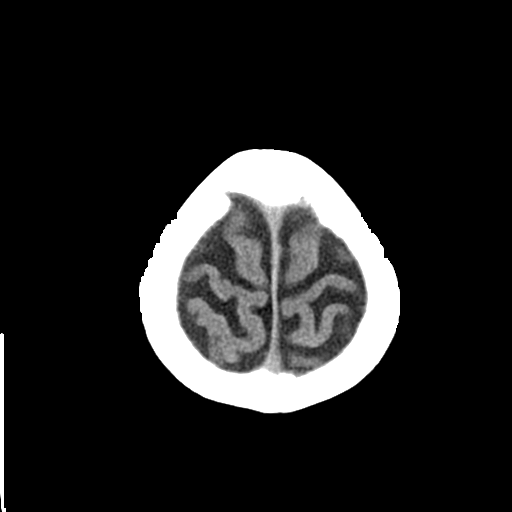
[im 28/30  brain]
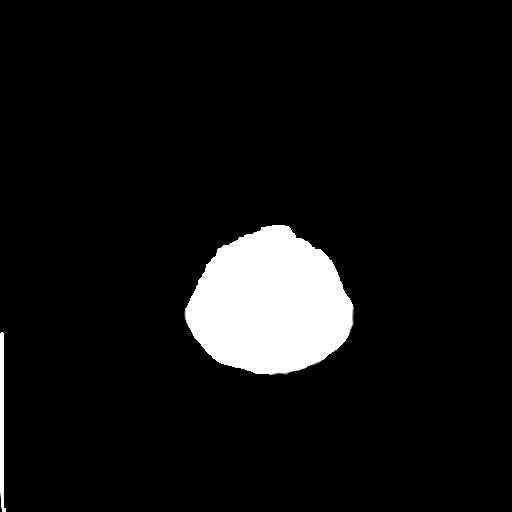
[im 28/30  bone]
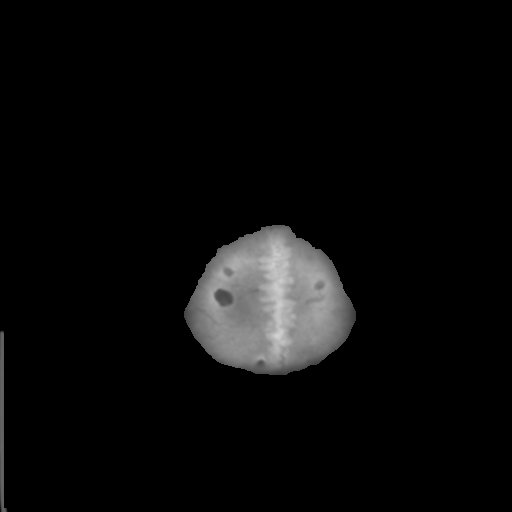

[Series 4: coronal soft tissue · coronal · 0.32mm/px · 3 of 66 slices shown]
[im 22/66  brain]
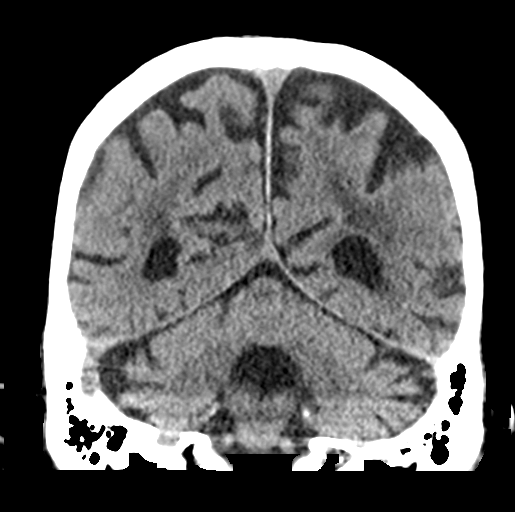
[im 29/66  brain]
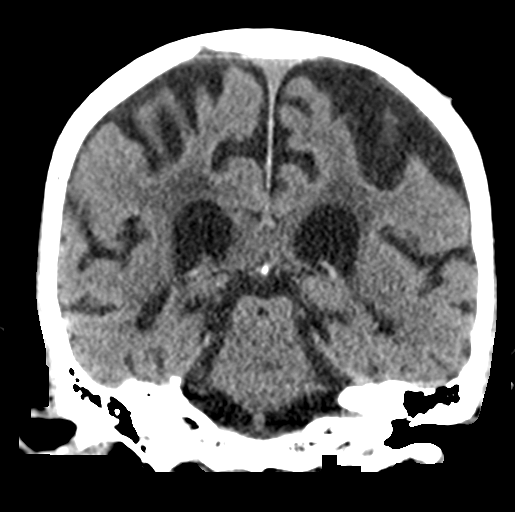
[im 37/66  brain]
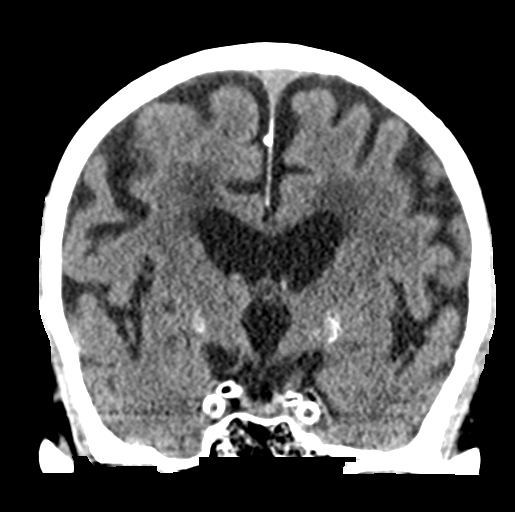

[Series 5: sagittal soft tissue · sagittal · 0.32mm/px · 3 of 52 slices shown]
[im 18/52  brain]
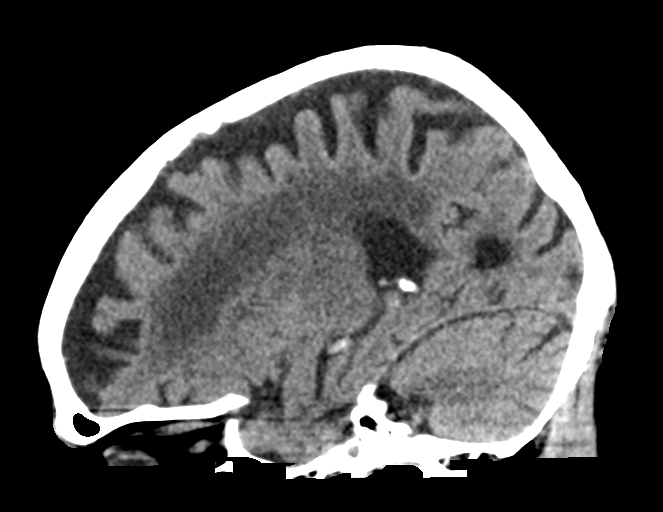
[im 26/52  brain]
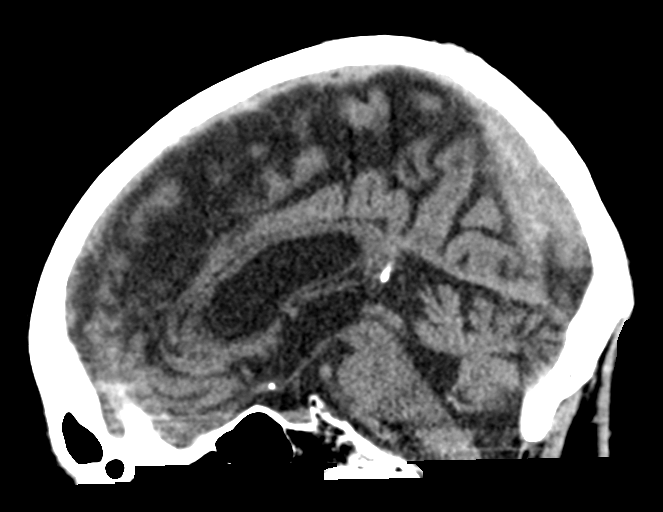
[im 35/52  brain]
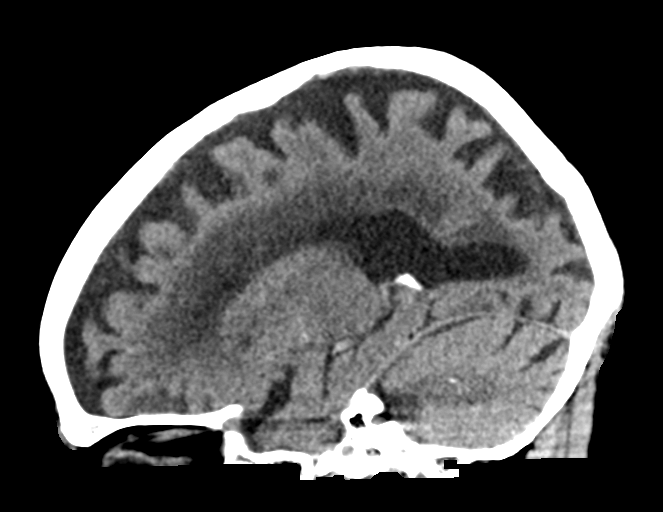

[15 of 47 positions shown; findings below may reference images not displayed]

FINDINGS: Brain: Ovoid region of hypoattenuation in the left thalamus has an
appearance most consistent with a remote lacunar infarct. There is
dense mineralization of the globus pallidus compatible with
senescent change. No evidence of acute infarction, hemorrhage,
hydrocephalus, extra-axial collection or mass lesion/mass effect.
Symmetric prominence of the ventricles, cisterns and sulci
compatible with advanced parenchymal volume loss. Confluent areas of
white matter hypoattenuation are most compatible with chronic
microvascular angiopathy.

Vascular: Atherosclerotic calcification of the carotid siphons. No
hyperdense vessel.

Skull: No calvarial fracture or suspicious osseous lesion. No scalp
swelling or hematoma.

Sinuses/Orbits: Paranasal sinuses and mastoid air cells are
predominantly clear. Included orbital structures are unremarkable.

Other: None
IMPRESSION: 1. Likely remote left thalamic lacunar infarct.
2. No acute intracranial abnormality.
3. Advanced parenchymal volume loss and chronic microvascular
angiopathy.

## 2021-03-04 IMAGING — DX DG CHEST 1V PORT
1 series · 1 of 1 positions shown · non-contrast
Comparison: Chest radiograph yesterday.

CLINICAL DATA: PICC placement.

EXAM:
PORTABLE CHEST 1 VIEW

[chest ap]
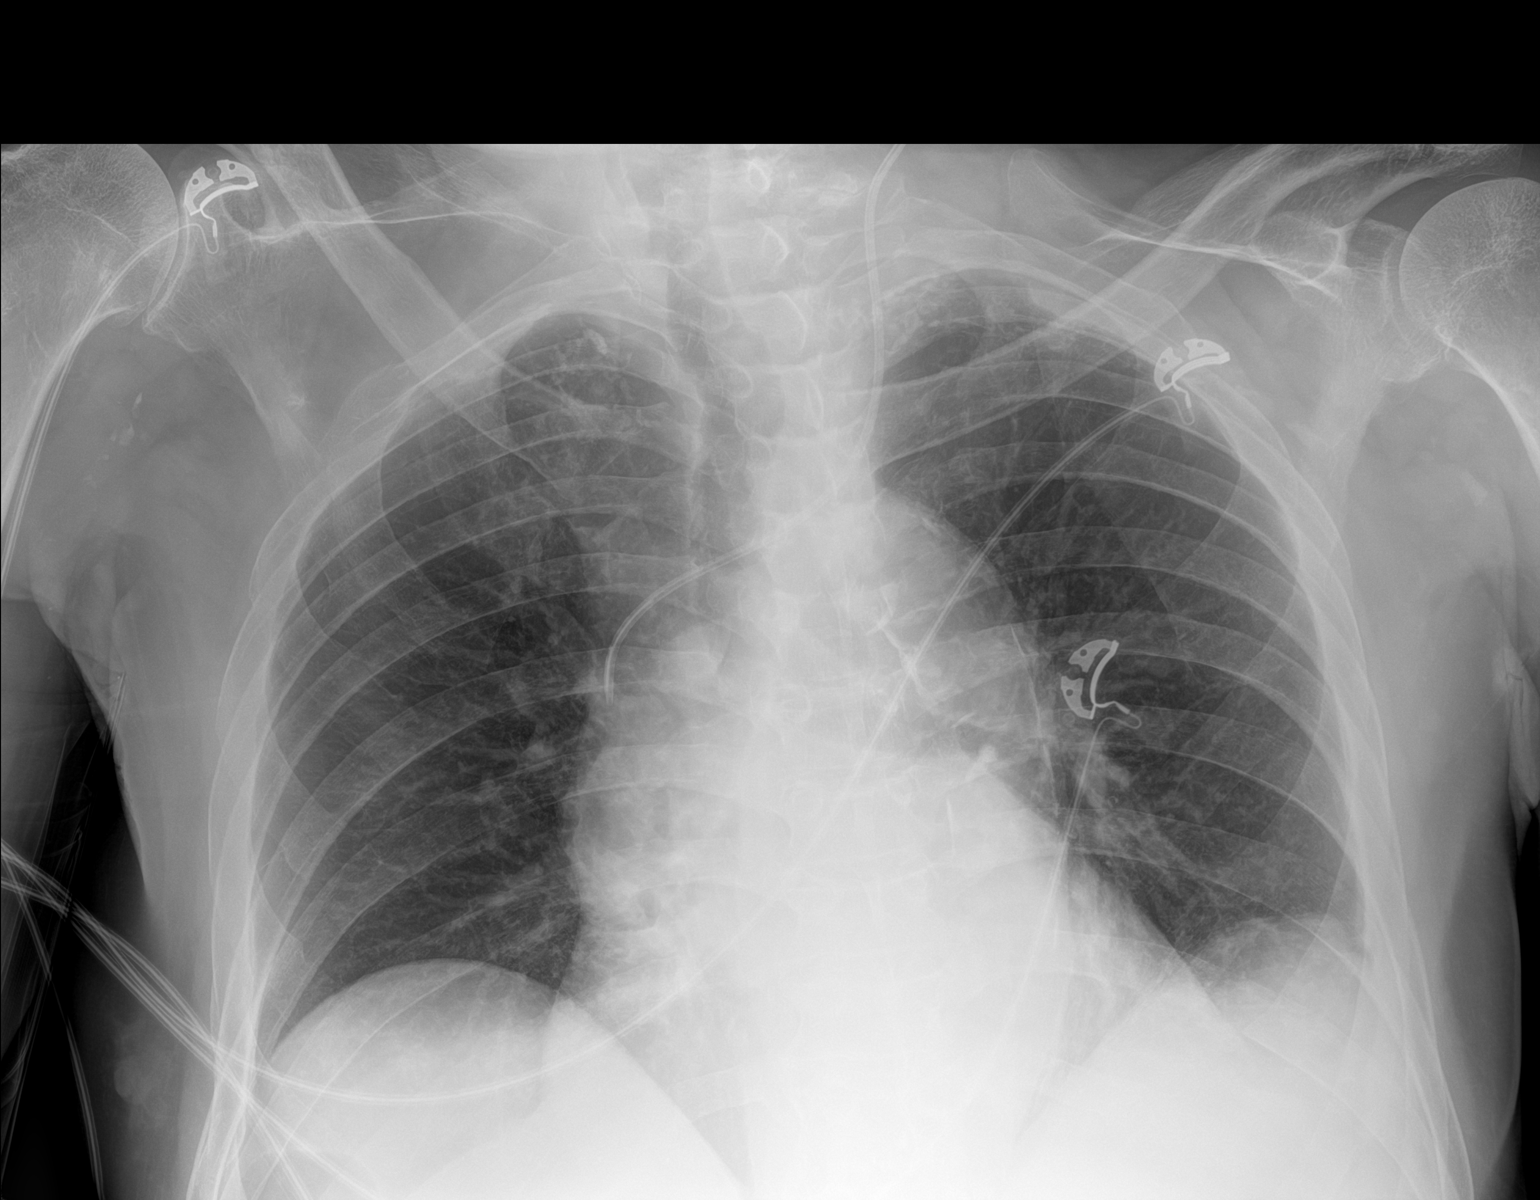

[1 of 1 positions shown; findings below may reference images not displayed]

FINDINGS: Left internal jugular central venous catheter tip projects over the
mid SVC. No pneumothorax. Low lung volumes accentuating cardiac
size. Tortuosity and atherosclerosis of the thoracic aorta. Stable
blunting of costophrenic angles. No pulmonary edema. There is
biapical pleuroparenchymal scarring.
IMPRESSION: 1. Left internal jugular central venous catheter tip projects over
the mid SVC. No pneumothorax.
2. Lower lung volumes from prior exam. Stable blunting of
costophrenic angles.
3.  Aortic Atherosclerosis ([id number]).

## 2021-03-05 ENCOUNTER — Ambulatory Visit: Payer: Medicare Other | Admitting: Physician Assistant

## 2021-03-12 ENCOUNTER — Other Ambulatory Visit: Payer: Self-pay

## 2021-03-12 ENCOUNTER — Encounter: Payer: Medicare Other | Admitting: Physician Assistant

## 2021-03-12 DIAGNOSIS — Z09 Encounter for follow-up examination after completed treatment for conditions other than malignant neoplasm: Secondary | ICD-10-CM | POA: Diagnosis not present

## 2021-03-12 NOTE — Progress Notes (Signed)
YOUSUF, AGER ([id number]) Visit Report for 03/12/2021 Arrival Information Details Patient Name: [name] [name], [name] [name]. Date of Service: 03/12/2021 1:00 PM Medical Record Number: [id number] Patient Account Number: [id number] Date of Birth/Sex: 27-Aug-1935 (86 y.o. M) Treating RN: Hansel Feinstein Primary Care Insiya Oshea: Gorden Harms Other Clinician: Referring Edmund Holcomb: Gorden Harms Treating Riccardo Holeman/Extender: Rowan Blase in Treatment: 21 Visit Information History Since Last Visit Added or deleted any medications: No Patient Arrived: [name] Had a fall or experienced change in No Arrival Time: 13:08 activities of daily living that may affect Accompanied By: self risk of falls: Transfer Assistance: None Hospitalized since last visit: No Patient Identification Verified: Yes Has Dressing in Place as Prescribed: Yes Secondary Verification Process Completed: Yes Pain Present Now: No Patient Requires Transmission-Based No Precautions: Patient Has Alerts: Yes Patient Alerts: Patient on Blood Thinner NON COMPRESSABLE Electronic Signature(s) Signed: 03/12/2021 4:45:13 PM By: Hansel Feinstein Entered By: Hansel Feinstein on 03/12/2021 13:09:05 [name] [name] ([id number]) -------------------------------------------------------------------------------- Clinic Level of Care Assessment Details Patient Name: [name] [name]. Date of Service: 03/12/2021 1:00 PM Medical Record Number: [id number] Patient Account Number: [id number] Date of Birth/Sex: 27-Nov-1934 (86 y.o. M) Treating RN: Rogers Blocker Primary Care Oney Tatlock: Gorden Harms Other Clinician: Referring Eleesha Purkey: Gorden Harms Treating Cleatus Gabriel/Extender: Rowan Blase in Treatment: 21 Clinic Level of Care Assessment Items TOOL 4 Quantity Score X - Use when only an EandM is performed on FOLLOW-UP visit 1 0 ASSESSMENTS - Nursing Assessment / Reassessment X - Reassessment of Co-morbidities (includes updates in patient status) 1  10 X- 1 5 Reassessment of Adherence to Treatment Plan ASSESSMENTS - Wound and Skin Assessment / Reassessment []  - Simple Wound Assessment / Reassessment - one wound 0 []  - 0 Complex Wound Assessment / Reassessment - multiple wounds []  - 0 Dermatologic / Skin Assessment (not related to wound area) ASSESSMENTS - Focused Assessment []  - Circumferential Edema Measurements - multi extremities 0 []  - 0 Nutritional Assessment / Counseling / Intervention []  - 0 Lower Extremity Assessment (monofilament, tuning fork, pulses) []  - 0 Peripheral Arterial Disease Assessment (using hand held doppler) ASSESSMENTS - Ostomy and/or Continence Assessment and Care []  - Incontinence Assessment and Management 0 []  - 0 Ostomy Care Assessment and Management (repouching, etc.) PROCESS - Coordination of Care X - Simple Patient / Family Education for ongoing care 1 15 []  - 0 Complex (extensive) Patient / Family Education for ongoing care []  - 0 Staff obtains , Records, Test Results / Process Orders []  - 0 Staff telephones HHA, Nursing Homes / Clarify orders / etc []  - 0 Routine Transfer to another Facility (non-emergent condition) []  - 0 Routine Hospital Admission (non-emergent condition) []  - 0 New Admissions / / Ordering NPWT, Apligraf, etc. []  - 0 Emergency Hospital Admission (emergent condition) X- 1 10 Simple Discharge Coordination []  - 0 Complex (extensive) Discharge Coordination PROCESS - Special Needs []  - Pediatric / Minor Patient Management 0 []  - 0 Isolation Patient Management []  - 0 Hearing / Language / Visual special needs []  - 0 Assessment of Community assistance (transportation, D/C planning, etc.) []  - 0 Additional assistance / Altered mentation []  - 0 Support Surface(s) Assessment (bed, cushion, seat, etc.) INTERVENTIONS - Wound Cleansing / Measurement [name] [name], [name] L. ( ) []  - 0 Simple Wound Cleansing - one wound []  -  0 Complex Wound Cleansing - multiple wounds []  - 0 Wound Imaging (photographs - any number of wounds) []  - 0 Wound Tracing (instead of photographs) []  - 0 Simple Wound Measurement -  one wound []  - 0 Complex Wound Measurement - multiple wounds INTERVENTIONS - Wound Dressings []  - Small Wound Dressing one or multiple wounds 0 []  - 0 Medium Wound Dressing one or multiple wounds []  - 0 Large Wound Dressing one or multiple wounds []  - 0 Application of Medications - topical []  - 0 Application of Medications - injection INTERVENTIONS - Miscellaneous []  - External ear exam 0 []  - 0 Specimen Collection (cultures, biopsies, blood, body fluids, etc.) []  - 0 Specimen(s) / Culture(s) sent or taken to Lab for analysis []  - 0 Patient Transfer (multiple staff / / Similar devices) []  - 0 Simple Staple / Suture removal (25 or less) []  - 0 Complex Staple / Suture removal (26 or more) []  - 0 Hypo / Hyperglycemic Management (close monitor of Blood Glucose) []  - 0 Ankle / Brachial Index (ABI) - do not check if billed separately X- 1 5 Vital Signs Has the patient been seen at the hospital within the last three years: Yes Total Score: 45 Level Of Care: New/Established - Level 2 Electronic Signature(s) Signed: 03/12/2021 4:24:12 PM By: RN Entered By: on 03/12/2021 13:47:28 ( ) -------------------------------------------------------------------------------- Encounter Discharge Information Details Patient Name: . Date of Service: 03/12/2021 1:00 PM Medical Record Number: Patient Account Number: Nurse, adult Date of Birth/Sex: [birth year] (86 y.o. M) Treating RN: Primary Care Ayelen Sciortino: Other Clinician: Referring Ladarious Kresse: 03/14/2021 Treating Branko Steeves/Extender: Rogers Blocker in Treatment: 21 Encounter Discharge Information Items Discharge Condition:  Stable Ambulatory Status: Cane Discharge Destination: Home Transportation: Private Auto Accompanied By: self Schedule Follow-up Appointment: No Clinical Summary of Care: Electronic Signature(s) Signed: 03/12/2021 4:24:12 PM By: [name] Rafter RN Entered By: [id number] on 03/12/2021 13:48:09 03/14/2021 ([id number]) -------------------------------------------------------------------------------- Lower Extremity Assessment Details Patient Name: [name] [name], [name] L. Date of Service: 03/12/2021 1:00 PM Medical Record Number: [id number] Patient Account Number: Rogers Blocker Date of Birth/Sex: 01/29/1935 (86 y.o. M) Treating RN: Rowan Blase Primary Care Journie Howson: 22 Other Clinician: Referring Francina Beery: 03/14/2021 Treating Aseel Uhde/Extender: Rogers Blocker Weeks in Treatment: 21 Edema Assessment Assessed: [Left: Yes] [Right: No] Edema: [Left: N] [Right: o] Electronic Signature(s) Signed: 03/12/2021 4:45:13 PM By: 03/14/2021 Entered By: [name] [name] on 03/12/2021 13:17:24 [name] [name], [name] [name] (03/14/2021) -------------------------------------------------------------------------------- Multi Wound Chart Details Patient Name: [id number]. Date of Service: 03/12/2021 1:00 PM Medical Record Number: 03/19/1935 Patient Account Number: [id number] Date of Birth/Sex: 1935/03/05 (86 y.o. 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  Extremity Comorbid History: Chronic Obstructive Pulmonary Chronic Obstructive Pulmonary N/A Disease (COPD), Congestive Heart Disease (COPD), Congestive Heart Failure, Hypertension, Myocardial Failure, Hypertension, Myocardial Infarction, Type II Diabetes Infarction, Type II Diabetes Date Acquired: 09/14/2019 01/12/2021 N/A Weeks of Treatment: 21 8 N/A Wound Status: Healed - Epithelialized Healed - Epithelialized N/A Wound Recurrence: Yes No N/A Measurements L x W x D (cm) 0x0x0 0x0x0 N/A Area (cm) : 0 0 N/A Volume (cm) : 0 0 N/A % Reduction in Area: 100.00% 100.00% N/A % Reduction in Volume: 100.00% 100.00% N/A Classification:  Grade 2 Grade 2 N/A Exudate Amount: None Present None Present N/A Granulation Amount: None Present (0%) None Present (0%) N/A Necrotic Amount: None Present (0%) None Present (0%) N/A Exposed Structures: Fascia: No Fascia: No N/A Fat Layer (Subcutaneous Tissue): Fat Layer (Subcutaneous Tissue): No No Tendon: No Tendon: No Muscle: No Muscle: No Joint: No Joint: No Bone: No Bone: No Epithelialization: Large (67-100%) Large (67-100%) N/A Treatment Notes Electronic Signature(s) Signed: 03/12/2021 4:24:12 PM By: Rogers Blocker RN Entered By: Rogers Blocker on 03/12/2021 13:42:55 [name] [name] ([id number]) -------------------------------------------------------------------------------- Multi-Disciplinary Care Plan Details Patient Name: [name] [name]. Date of Service: 03/12/2021 1:00 PM Medical Record Number: [id number] Patient Account Number: [id number] Date of Birth/Sex: [birth year] (86 y.o. M) Treating RN: Rogers Blocker Primary Care Trafton Roker: Gorden Harms Other Clinician: Referring Arabel Barcenas: Gorden Harms Treating Rafi Kenneth/Extender: Rowan Blase in Treatment: 21 Active Inactive Electronic Signature(s) Signed: 03/12/2021 4:24:12 PM By: Rogers Blocker RN Entered By: Rogers Blocker on 03/12/2021 13:42:48 [name] [name]  ([id number]) -------------------------------------------------------------------------------- Pain Assessment Details Patient Name: [name] [name], [name] [name]. Date of Service: 03/12/2021 1:00 PM Medical Record Number: [id number] Patient Account Number: [id number] Date of Birth/Sex: 1935-07-10 (86 y.o. M) Treating RN: Hansel Feinstein Primary Care Lewellyn Fultz: Gorden Harms Other Clinician: Referring Cristianna Cyr: Gorden Harms Treating Tryone Kille/Extender: Rowan Blase in Treatment: 21 Active Problems Location of Pain Severity and Description of Pain Patient Has Paino No Site Locations Rate the pain. Current Pain Level: 0 Pain Management and Medication Current Pain Management: Electronic Signature(s) Signed: 03/12/2021 4:45:13 PM By: Hansel Feinstein Entered By: Hansel Feinstein on 03/12/2021 13:14:08 [name] [name] ([id number]) -------------------------------------------------------------------------------- Patient/Caregiver Education Details Patient Name: [name] [name]. Date of Service: 03/12/2021 1:00 PM Medical Record Number: [id number] Patient Account Number: [id number] Date of Birth/Gender: 16-Feb-1935 (86 y.o. M) Treating RN: Rogers Blocker Primary Care Physician: Gorden Harms Other Clinician: Referring Physician: Gorden Harms Treating Physician/Extender: Rowan Blase in Treatment: 21 Education Assessment Education Provided To: Patient Education Topics Provided Wound/Skin Impairment: Methods: Explain/Verbal Responses: State content correctly Electronic Signature(s) Signed: 03/12/2021 4:24:12 PM By: Rogers Blocker RN Entered By: Rogers Blocker on 03/12/2021 13:47:46 Suchy, [name] ([id number]) -------------------------------------------------------------------------------- Wound Assessment Details Patient Name: [name] [name], [name] [name]. Date of Service: 03/12/2021 1:00 PM Medical Record Number: [id number] Patient Account Number: [id number] Date of Birth/Sex: 02/08/1935 (86 y.o.  M) Treating RN: Hansel Feinstein Primary Care Karime Scheuermann: Gorden Harms Other Clinician: Referring Ellysia Char: Gorden Harms Treating Cinde Ebert/Extender: Rowan Blase in Treatment: 21 Wound Status Wound Number: 1R Primary Diabetic Wound/Ulcer of the Lower Extremity Etiology: Wound Location: Left, Lateral Foot Wound Healed - Epithelialized Wounding Event: Gradually Appeared Status: Date Acquired: 09/14/2019 Comorbid Chronic Obstructive Pulmonary Disease (COPD), Weeks Of Treatment: 21 History: Congestive Heart Failure, Hypertension, Myocardial Clustered Wound: No Infarction, Type II Diabetes Photos Wound Measurements Length: (cm) 0 Width: (cm) 0 Depth: (cm) 0 Area: (cm) 0 Volume: (cm) 0 % Reduction in Area: 100% % Reduction in Volume: 100% Epithelialization: Large (67-100%) Tunneling: No Undermining: No Wound Description Classification: Grade 2 Exudate Amount: None Present Foul Odor After Cleansing: No Slough/Fibrino No Wound Bed Granulation Amount: None Present (0%) Exposed Structure Necrotic Amount: None Present (0%) Fascia Exposed: No Fat Layer (Subcutaneous Tissue) Exposed: No Tendon Exposed: No Muscle Exposed: No Joint Exposed: No Bone Exposed: No Electronic Signature(s) Signed: 03/12/2021 4:24:12 PM By: Rogers Blocker RN Signed: 03/12/2021 4:45:13 PM By: Hansel Feinstein Entered By: Rogers Blocker on 03/12/2021 13:42:04 [name] [name] ([id number]) -------------------------------------------------------------------------------- Wound Assessment Details Patient Name: [name] [name],  [name] L. Date of Service: 03/12/2021 1:00 PM Medical Record Number: [id number] Patient Account Number: [id number] Date of Birth/Sex: 1935/07/09 (86 y.o. M) Treating RN: Hansel Feinstein Primary Care Kushal Saunders: Gorden Harms Other Clinician: Referring Avalie Oconnor: Gorden Harms Treating Jazzlene Huot/Extender: Rowan Blase in Treatment: 21 Wound Status Wound Number: 2 Primary Diabetic  Wound/Ulcer of the Lower Extremity Etiology: Wound Location: Left Toe Second Wound Healed - Epithelialized Wounding Event: Gradually Appeared Status: Date Acquired: 01/12/2021 Comorbid Chronic Obstructive Pulmonary Disease (COPD), Weeks Of Treatment: 8 History: Congestive Heart Failure, Hypertension, Myocardial Clustered Wound: No Infarction, Type II Diabetes Photos Wound Measurements Length: (cm) 0 Width: (cm) 0 Depth: (cm) 0 Area: (cm) 0 Volume: (cm) 0 % Reduction in Area: 100% % Reduction in Volume: 100% Epithelialization: Large (67-100%) Tunneling: No Undermining: No Wound Description Classification: Grade 2 Exudate Amount: None Present Foul Odor After Cleansing: No Slough/Fibrino No Wound Bed Granulation Amount: None Present (0%) Exposed Structure Necrotic Amount: None Present (0%) Fascia Exposed: No Fat Layer (Subcutaneous Tissue) Exposed: No Tendon Exposed: No Muscle Exposed: No Joint Exposed: No Bone Exposed: No Electronic Signature(s) Signed: 03/12/2021 4:24:12 PM By: Rogers Blocker RN Signed: 03/12/2021 4:45:13 PM By: Hansel Feinstein Entered By: Rogers Blocker on 03/12/2021 13:42:33 [name] [name] ([id number]) -------------------------------------------------------------------------------- Vitals Details Patient Name: [name] [name]. Date of Service: 03/12/2021 1:00 PM Medical Record Number: [id number] Patient Account Number: [id number] Date of Birth/Sex: [birth year] (86 y.o. M) Treating RN: Hansel Feinstein Primary Care Dajaun Goldring: Gorden Harms Other Clinician: Referring Cleburn Maiolo: Gorden Harms Treating Elon Lomeli/Extender: Rowan Blase in Treatment: 21 Vital Signs Time Taken: 13:10 Temperature (F): 98.2 Height (in): 70 Pulse (bpm): 50 Weight (lbs): 151 Respiratory Rate (breaths/min): 18 Body Mass Index (BMI): 21.7 Blood Pressure (mmHg): 149/59 Reference Range: 80 - 120 mg / dl Notes pulse apical 62/HUTMLY. PA updated Electronic  Signature(s) Signed: 03/12/2021 1:25:18 PM By: Hansel Feinstein Entered ByHansel Feinstein on 03/12/2021 13:25:18

## 2021-03-12 NOTE — Progress Notes (Addendum)
VELMER, WOELFEL ([id number]) Visit Report for 03/12/2021 Chief Complaint Document Details Patient Name: [name] [name], [name] [name]. Date of Service: 03/12/2021 1:00 PM Medical Record Number: [id number] Patient Account Number: [id number] Date of Birth/Sex: 1935/01/21 (86 y.o. M) Treating RN: Dolan Amen Primary Care Provider: Crissie Figures Other Clinician: Referring Provider: Crissie Figures Treating Provider/Extender: Skipper Cliche in Treatment: 21 Information Obtained from: Patient Chief Complaint Left foot ulcer Electronic Signature(s) Signed: 03/12/2021 1:19:32 PM By: Worthy Keeler PA-C Entered By: Worthy Keeler on 03/12/2021 13:19:32 [name] [name] ([id number]) -------------------------------------------------------------------------------- HPI Details Patient Name: [name] [name] Date of Service: 03/12/2021 1:00 PM Medical Record Number: [id number] Patient Account Number: [id number] Date of Birth/Sex: 08-26-1935 (86 y.o. M) Treating RN: Dolan Amen Primary Care Provider: Crissie Figures Other Clinician: Referring Provider: Crissie Figures Treating Provider/Extender: Skipper Cliche in Treatment: 21 History of Present Illness HPI Description: 10/10/2020 upon evaluation today patient presents for initial evaluation in our clinic concerning a wound he has on the left lateral foot. 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 With that being said currently he has a wound portion of his left foot that is what we are seeing him for today everything else appears to actually be doing quite well which is great news. There does not appear to be any signs of active infection at this time which is also great news. He does have a history of diabetes mellitus type 2, chronic kidney disease stage III, hypertension,  paroxysmal atrial fibrillation, and long-term use of anticoagulant therapy due to the atrial fibrillation. He does not have any pain secondary to neuropathy. His most recent hemoglobin A1c was 6.5 roughly a year ago as best we can tell. He does have [facility] coming out currently to help take care of his wound. 10/24/2020 upon evaluation today patient appears to be doing well with regard to his wound. In fact I am very pleased with how things appear to be doing at this time. That is the least of his concerns at this time. Overall I am extremely pleased with the progress has been made. With that being said my biggest concern at this point he has actually not to do with his wound and other his blood pressure which is significantly elevated today. I am not certain that he has been taking his blood pressure upon further questioning with the patient and his family in fact I am almost certain that he is not. We did attempt to call his primary care provider but unfortunately we were unable to get them on the phone. The phone just rang. Nonetheless I think that he has an appointment with the cardiologist on Monday they can obviously recheck his blood pressure then. He also needs to get an appointment with his primary care provider likely to discuss his blood pressures. But also if he is not taking the medicine taking the medicine could obviously make a big difference in his blood pressure as it stands as well. 01/01/2021 upon evaluation today patient appears to be doing decently well in regard to his foot ulcer. He is going require some sharp debridement is actually been quite sometime since have seen him I think the main issue here is that he unfortunately was having some issues with transportation and just not feeling well. Either way I think that we need  to definitely get things back in order and seeing him on a regular basis. The patient voiced understanding. With that being said his wound is going require  sharp debridement at this point. 01/15/2021 upon evaluation today patient appears to be doing well with regard to his lateral foot ulcer. Unfortunately he has a new wound on his left second toe which appears to be something that rubbed. He tells me that this is something that happened as a result of him clipping his toenails. Again I am not exactly sure how that would have occurred however. Nonetheless I think this is a significant issue here that is going to require some potential intervention. 5/27; his lateral foot wound over the fifth met head is closed. He still has an area on the tip of the left second toe although this appears to be smaller than measured last time it certainly does not probe to bone he has a hammertoe in this area. He is wearing a surgical shoe 02/12/2021 upon evaluation today patient appears to be doing well with regard to the wounds on his foot. Both the left second toe as well as the left lateral foot both are still open the lateral foot I had to reopen as it appeared there was some fluid collecting underneath of a bit of callus. That does mean that we can have to remove this so that we can see this completely heal. 02/19/2021 upon evaluation today patient's wounds are both showing signs of improvement. I am very pleased with regard to what we see. I do think that he is managing quite nicely as far as the wound is concerned overall I feel like that the lateral foot wound is going require little bit of sharp debridement but again nothing too significant here. 03/12/2021 upon evaluation today patient appears to be doing well currently in regard to his wounds. In fact both appear to be completely healed. I am very pleased with where things stand today. Electronic Signature(s) Signed: 03/12/2021 1:45:23 PM By: Worthy Keeler PA-C Entered By: Worthy Keeler on 03/12/2021 13:45:23 [name] [name]  ([id number]) -------------------------------------------------------------------------------- Physical Exam Details Patient Name: [name] [name]. Date of Service: 03/12/2021 1:00 PM Medical Record Number: [id number] Patient Account Number: [id number] Date of Birth/Sex: 09/15/1934 (86 y.o. M) Treating RN: Dolan Amen Primary Care Provider: Crissie Figures Other Clinician: Referring Provider: Crissie Figures Treating Provider/Extender: Skipper Cliche in Treatment: 56 Constitutional Well-nourished and well-hydrated in no acute distress. Respiratory normal breathing without difficulty. Psychiatric this patient is able to make decisions and demonstrates good insight into disease process. Alert and Oriented x 3. pleasant and cooperative. Notes Patient's wound bed showed signs again of good granulation epithelization at this point. I do not see any signs of infection which is great and overall very pleased with where he stands today. No fevers, chills, nausea, vomiting, or diarrhea. Electronic Signature(s) Signed: 03/12/2021 1:45:37 PM By: Worthy Keeler PA-C Entered By: Worthy Keeler on 03/12/2021 13:45:37 [name] [name] ([id number]) -------------------------------------------------------------------------------- Physician Orders Details Patient Name: ZOHAIR, EPP. Date of Service: 03/12/2021 1:00 PM Medical Record Number: [id number] Patient Account Number: [id number] Date of Birth/Sex: [birth year] (86 y.o. M) Treating RN: Dolan Amen Primary Care Provider: Crissie Figures Other Clinician: Referring Provider: Crissie Figures Treating Provider/Extender: Skipper Cliche in Treatment: 21 Verbal / Phone Orders: No Diagnosis Coding ICD-10 Coding Code Description E11.621 Type 2 diabetes mellitus with foot ulcer L97.522 Non-pressure chronic ulcer of other part of left foot with fat layer exposed N18.30  Chronic kidney disease, stage 3 unspecified I10 Essential (primary)  hypertension I48.0 Paroxysmal atrial fibrillation Z79.01 Long term (current) use of anticoagulants Discharge From [facility] o Discharge from [facility] Complete - Continue wearing surgical shoe x 2 weeks Electronic Signature(s) Signed: 03/12/2021 4:24:12 PM By: Dolan Amen RN Signed: 03/12/2021 11:03:25 PM By: Worthy Keeler PA-C Entered By: Dolan Amen on 03/12/2021 13:46:59 Apfel, Eric Form ([id number]) -------------------------------------------------------------------------------- Problem List Details Patient Name: NELVIN, TOMB L. Date of Service: 03/12/2021 1:00 PM Medical Record Number: [id number] Patient Account Number: [id number] Date of Birth/Sex: 10/28/1934 (86 y.o. M) Treating RN: Dolan Amen Primary Care Provider: Crissie Figures Other Clinician: Referring Provider: Crissie Figures Treating Provider/Extender: Skipper Cliche in Treatment: 21 Active Problems ICD-10 Encounter Code Description Active Date MDM Diagnosis E11.621 Type 2 diabetes mellitus with foot ulcer 10/10/2020 No Yes L97.522 Non-pressure chronic ulcer of other part of left foot with fat layer 10/10/2020 No Yes exposed N18.30 Chronic kidney disease, stage 3 unspecified 10/10/2020 No Yes I10 Essential (primary) hypertension 10/10/2020 No Yes I48.0 Paroxysmal atrial fibrillation 10/10/2020 No Yes Z79.01 Long term (current) use of anticoagulants 10/10/2020 No Yes Inactive Problems Resolved Problems Electronic Signature(s) Signed: 03/12/2021 1:19:25 PM By: Worthy Keeler PA-C Entered By: Worthy Keeler on 03/12/2021 13:19:25 [name] [name] ([id number]) -------------------------------------------------------------------------------- Progress Note Details Patient Name: [name] [name]. Date of Service: 03/12/2021 1:00 PM Medical Record Number: [id number] Patient Account Number: [id number] Date of Birth/Sex: 1934/12/17 (86 y.o. M) Treating RN: Dolan Amen Primary  Care Provider: Crissie Figures Other Clinician: Referring Provider: Crissie Figures Treating Provider/Extender: Skipper Cliche in Treatment: 21 Subjective Chief Complaint Information obtained from Patient Left foot ulcer History of Present Illness (HPI) 10/10/2020 upon evaluation today patient presents for initial evaluation in our clinic concerning a wound he has on the left lateral foot. 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 With that being said currently he has a wound portion of his left foot that is what we are seeing him for today everything else appears to actually be doing quite well which is great news. There does not appear to be any signs of active infection at this time which is also great news. He does have a history of diabetes mellitus type 2, chronic kidney disease stage III, hypertension, paroxysmal atrial fibrillation, and long-term use of anticoagulant therapy due to the atrial fibrillation. He does not have any pain secondary to neuropathy. His most recent hemoglobin A1c was 6.5 roughly a year ago as best we can tell. He does have [facility] coming out currently to help take care of his wound. 10/24/2020 upon evaluation today patient appears to be doing well with regard to his wound. In fact I am very pleased with how things appear to be doing at this time. That is the least of his concerns at this time. Overall I am extremely pleased with the progress has been made. With that being said my biggest concern at this point he has actually not to do with his wound and other his blood pressure which is significantly elevated today. I am not certain that he has been taking his blood pressure upon further questioning with the patient and his family in fact I am almost certain that he is not. We did attempt to call his  primary  care provider but unfortunately we were unable to get them on the phone. The phone just rang. Nonetheless I think that he has an appointment with the cardiologist on Monday they can obviously recheck his blood pressure then. He also needs to get an appointment with his primary care provider likely to discuss his blood pressures. But also if he is not taking the medicine taking the medicine could obviously make a big difference in his blood pressure as it stands as well. 01/01/2021 upon evaluation today patient appears to be doing decently well in regard to his foot ulcer. He is going require some sharp debridement is actually been quite sometime since have seen him I think the main issue here is that he unfortunately was having some issues with transportation and just not feeling well. Either way I think that we need to definitely get things back in order and seeing him on a regular basis. The patient voiced understanding. With that being said his wound is going require sharp debridement at this point. 01/15/2021 upon evaluation today patient appears to be doing well with regard to his lateral foot ulcer. Unfortunately he has a new wound on his left second toe which appears to be something that rubbed. He tells me that this is something that happened as a result of him clipping his toenails. Again I am not exactly sure how that would have occurred however. Nonetheless I think this is a significant issue here that is going to require some potential intervention. 5/27; his lateral foot wound over the fifth met head is closed. He still has an area on the tip of the left second toe although this appears to be smaller than measured last time it certainly does not probe to bone he has a hammertoe in this area. He is wearing a surgical shoe 02/12/2021 upon evaluation today patient appears to be doing well with regard to the wounds on his foot. Both the left second toe as well as the left lateral foot both  are still open the lateral foot I had to reopen as it appeared there was some fluid collecting underneath of a bit of callus. That does mean that we can have to remove this so that we can see this completely heal. 02/19/2021 upon evaluation today patient's wounds are both showing signs of improvement. I am very pleased with regard to what we see. I do think that he is managing quite nicely as far as the wound is concerned overall I feel like that the lateral foot wound is going require little bit of sharp debridement but again nothing too significant here. 03/12/2021 upon evaluation today patient appears to be doing well currently in regard to his wounds. In fact both appear to be completely healed. I am very pleased with where things stand today. Objective Constitutional Well-nourished and well-hydrated in no acute distress. Vitals Time Taken: 1:10 PM, Height: 70 in, Weight: 151 lbs, BMI: 21.7, Temperature: 98.2 F, Pulse: 50 bpm, Respiratory Rate: 18 breaths/min, Blood Pressure: 149/59 mmHg. General Notes: pulse apical 50/minute. PA updated JUNG, YURCHAK ([id number]) Respiratory normal breathing without difficulty. Psychiatric this patient is able to make decisions and demonstrates good insight into disease process. Alert and Oriented x 3. pleasant and cooperative. General Notes: Patient's wound bed showed signs again of good granulation epithelization at this point. I do not see any signs of infection which is great and overall very pleased with where he stands today. No fevers, chills, nausea, vomiting, or diarrhea. Integumentary (Hair, Skin)  Wound #1R status is Healed - Epithelialized. Original cause of wound was Gradually Appeared. The date acquired was: 09/14/2019. The wound has been in treatment 21 weeks. The wound is located on the Left,Lateral Foot. The wound measures 0cm length x 0cm width x 0cm depth; 0cm^2 area and 0cm^3 volume. There is no tunneling or undermining noted. There  is a none present amount of drainage noted. There is no granulation within the wound bed. There is no necrotic tissue within the wound bed. Wound #2 status is Healed - Epithelialized. Original cause of wound was Gradually Appeared. The date acquired was: 01/12/2021. The wound has been in treatment 8 weeks. The wound is located on the Left Toe Second. The wound measures 0cm length x 0cm width x 0cm depth; 0cm^2 area and 0cm^3 volume. There is no tunneling or undermining noted. There is a none present amount of drainage noted. There is no granulation within the wound bed. There is no necrotic tissue within the wound bed. Assessment Active Problems ICD-10 Type 2 diabetes mellitus with foot ulcer Non-pressure chronic ulcer of other part of left foot with fat layer exposed Chronic kidney disease, stage 3 unspecified Essential (primary) hypertension Paroxysmal atrial fibrillation Long term (current) use of anticoagulants 1. I would recommend that we discontinue wound care services as the patient is completely healed and he is in agreement with the plan. 2. I am going to suggest that he continue to use his offloading shoes for the next month. I think this to do well as far as helping to protect him from anything worsening. After that he can go to wearing his normal standard tennis shoes. We will see patient back for reevaluation in 1 week here in the clinic. If anything worsens or changes patient will contact our office for additional recommendations. Plan 1. I would recommend that we discontinue wound care services as the patient is completely healed and he is in agreement with the plan. 2. I am going to suggest that he continue to use his offloading shoes for the next month. I think this to do well as far as helping to protect him from anything worsening. After that he can go to wearing his normal standard tennis shoes. We will see patient back for reevaluation in 1 week here in the clinic. If  anything worsens or changes patient will contact our office for additional recommendations. Electronic Signature(s) Signed: 03/12/2021 1:46:48 PM By: Worthy Keeler PA-C Previous Signature: 03/12/2021 1:46:31 PM Version By: Worthy Keeler PA-C Entered By: Worthy Keeler on 03/12/2021 13:46:48 Blasdell, Eric Form ([id number]) -------------------------------------------------------------------------------- SuperBill Details Patient Name: [name] [name] Date of Service: 03/12/2021 Medical Record Number: [id number] Patient Account Number: [id number] Date of Birth/Sex: November 16, 1934 (86 y.o. M) Treating RN: Dolan Amen Primary Care Provider: Crissie Figures Other Clinician: Referring Provider: Crissie Figures Treating Provider/Extender: Skipper Cliche in Treatment: 21 Diagnosis Coding ICD-10 Codes Code Description E11.621 Type 2 diabetes mellitus with foot ulcer L97.522 Non-pressure chronic ulcer of other part of left foot with fat layer exposed N18.30 Chronic kidney disease, stage 3 unspecified I10 Essential (primary) hypertension I48.0 Paroxysmal atrial fibrillation Z79.01 Long term (current) use of anticoagulants Facility Procedures CPT4 Code: [id number] Description: [phone] - WOUND CARE VISIT-LEV 2 EST PT Modifier: Quantity: 1 Physician Procedures CPT4 Code: [id number] Description: [id number] - WC PHYS LEVEL 3 - EST PT Modifier: Quantity: 1 CPT4 Code: Description: ICD-10 Diagnosis Description E11.621 Type 2 diabetes mellitus with foot ulcer L97.522 Non-pressure chronic ulcer of other part of left  foot with fat layer ex N18.30 Chronic kidney disease, stage 3 unspecified I10 Essential (primary)  hypertension Modifier: posed Quantity: Electronic Signature(s) Signed: 03/12/2021 4:24:12 PM By: Dolan Amen RN Signed: 03/12/2021 11:03:25 PM By: Worthy Keeler PA-C Previous Signature: 03/12/2021 1:47:00 PM Version By: Worthy Keeler PA-C Entered By: Dolan Amen on 03/12/2021  13:47:36

## 2021-04-10 NOTE — Telephone Encounter (Signed)
error 

## 2022-03-25 ENCOUNTER — Other Ambulatory Visit: Payer: Self-pay | Admitting: Family Medicine

## 2022-03-25 DIAGNOSIS — R399 Unspecified symptoms and signs involving the genitourinary system: Secondary | ICD-10-CM

## 2022-03-31 ENCOUNTER — Ambulatory Visit: Payer: Medicare (Managed Care) | Attending: Family Medicine

## 2022-04-11 NOTE — H&P (Signed)
 ------------------------------------------------------------------------------- Attestation signed by [name] [name] Gee, MD at 04/12/22 [phone] Admission Attestation: I saw and evaluated the patient, participating in the key portions of the service.  87 yo sent in from [facility] provider for worsening LFT elevation, concern for possible dehydration or rhabdo from heat wave in home without AC.  Etiology unclear at this time with much of the initial workup pending.  Creatinine elevated though appears similar to April baseline, consistent with CKD.  I reviewed the resident's note.  I agree with the resident's findings and plan.  I personally spent 55 minutes face-to-face and non-face-to-face in the care of this patient, which includes all pre, intra, and post visit time on the date of service. All documented time was specific to E/M and does not include any procedures that may have been performed.    [name] [name] [name], MD  -------------------------------------------------------------------------------  Family Medicine Inpatient Service History and Physical Note  Team: [facility] (pgr [phone]) PCP: [name] [name], [facility] Date of Admission: April 11, 2022 Code Status: full code Emergency Contact: Extended Emergency Contact Information Primary Emergency Contact: [name] [name],[name] [name] Mobile Phone: [phone] Relation: Relative Secondary Emergency Contact: [name] [name] Mobile Phone: [phone] Relation: Niece   ASSESSMENT / PLAN:  [name] [name] is a 87 y.o. male with a past medical history significant for COPD, A-Fib, HTN, CKD, Anemia,  who presents with worsening CKD and elevated LFTs from outpatient clinic.   # Elevated LFTs   LFTs AST 215 ALT 274. No hepatomegaly. No recognized jaundice. Eyes with some slceral icterus Ddx includes NAFLD, drug toxicity, viral hepatitis, autoimmune hepatitis, medication toxicity. Likely multifactorial with patient having long history of HLD, hypertension, DM,  and poor diet.  Patient also on several medications that affect the liver enzymes including amiodarone  and statin.  Also could be from parotitis no previous hepatitis screening visualized in chart.  Patient also with long history of alcohol use quit drinking liquor 15 years ago but still drinks beers daily, normally 3-4.  -Hepatitis panel -anit smooth muscle and anti mitochondrial  -Lipid panel -UDS, ethanol -GGT -PT/INR -Autoimmune labs -RUQ US    #Metabolic acidosis  pH 7.26 bicarb and CO2 down representing a metabolic acidosis 2/2 chronic kidney disease.  Bicarb at 16.  No signs of hypercapnia secondary to CHF and COPD and patient still on room air satting in the high 90s.  -bicarb if acute decompensation or worsening acidosis.  #Bradycardia  Heart rate normally in the 50s at baseline.  Reviewing records patient always had a close a bradycardic rate.  Telemetry showed bigeminy rhythm.  EKG showed normal QT and PR intervals.  Likely 2/2 amiodarone , clonidine, malnutrition. -Continue to monitor.   # CKD Stage 4  GFR 15-27 depending on formula/Hyperkalemia 3.11 patient with known chronic kidney disease last seen nephrology in April 2023.  Kidney function slowly declining but hanging around baseline.  Patient still producing urine.  Has not needed dialysis. K-5.2. -Hold ARB and spironolactone  -Lokelma if worsening   # Normocytic anemia  Hgb-11.9. Likely anemia of chronic kidney disease 2/2 chronic kidney disease.  Patient also has poor diet and could have some iron deficiencies.  Also thought of hemolytic anemia secondary to increase RDW.  -B12, folate -Iron studies, ferritin -Reticulocyte count  #HTN Likely from chronic kidney disease and nonadherence to blood pressure medications.  Olmesartan 10 mg, clonidine patches 0.3 mg daily, isosorbide  mononitrate 30 mg, spironolactone  12.5 mg.  BP 230/68 in the ED.  On recheck down to 109/56.  Patient currently has  clonidine patches on. -Hold  other BP meds.  #HFpEF   Last echo showed estimated LVEF of 65-70% and indeterminate diastolic function.  Currently asymptomatic.  Not requiring supplemental O2.  No peripheral edema.  On GDMT for CHF. -Hold spironolactone  and ARB in the setting of hyperkalemia.   #Chronic A-Fib Currently in normal sinus rhythm.  On Eliquis  and amiodarone .  CHA2DS2-VASc score of 6. -Continue amiodarone  and Eliquis .  #COPD No smoking history per patient.  On room air.  -Monitor O2 -Albuterol  inhaler as needed   #TypeIIDM  On Jardiance. Last A1c 6.3 in 02/2021 -Diabetes education -A1c -Pending A1c, morning blood glucose start insulin   #HLD/ CAD  On atorvastatin .  # Past Tobacco use : Quit smoking 23 years ago. Uses chewing tobacco - Nicotine  replacement therapy with patch, gum, lozenge PRN  # FEN/GI: - IVF None - Check electrolytes as indicated, replete as needed. - Diet Consistent Carbs  # PPX:  - DVT: Continue home anticoagulation with eliquis   # Dispo: Floor [ ]  Anticipated Discharge Location: Skilled nursing facility [ ]  PT/OT/DME: PT/OT ordered [ ]  CM/SW needs: Likely pending PT/OT recs [ ]  Teaching: None anticipated  HISTORY OF PRESENT ILLNESS: [name] [name] is a 87 y.o. male with a history of arthritis, asthma, atrial fibrillation, chronic kidney disease, COPD, coronary disease, diabetes, hyperlipidemia, hypertension who presents today for worsening Cr and dehydration from PCP.  Patient somewhat difficult to understand.  Spoke with niece and this is his baseline Aox3.  He was dropped off here by his niece.  Patient received lab work on Thursday afternoon at his PCPs office.  Lab work showed elevated LFTs and slightly increased Cr.  Patient when asked why he is here says bad kidneys.  Patient reports no CP, no headache, no wheezing, no n/v/d.  No fevers.  No dysuria.  No increased urinary frequency or urgency.  Has some chronic SOB at baseline.  Sleeps with 2 pillows at home.  Denies  dyspnea on exertion worsening dyspnea with laying on his left side.no new medications or supplements.  He was unaware his kidney function was declining.  Patient still drinks 3-4 beers about 3 to 4 days a week, but quit drinking liquor 15 years ago.  No previous diagnosis of hepatitis.  No rashes or edema.  Patient has not been consistently taking his medications over the last month.  COVID-19 Testing on Admission: Asymptomatic & Negative  PAST MEDICAL / SURGICAL HX: Past Medical History:  Diagnosis Date  . Atrial fibrillation (CMS-HCC)   . Chronic kidney disease   . COPD (chronic obstructive pulmonary disease) (CMS-HCC)   . Coronary artery disease   . Diabetes mellitus (CMS-HCC)   . Gout   . Hyperlipidemia   . Hypertension   . Lower urinary tract symptoms (LUTS) 07/08/2014  . Testosterone deficiency    Past Surgical History:  Procedure Laterality Date  . PR AMPUTATION FOOT,TRANSMETATARSAL Right 04/29/2020   Procedure: AMPUTATION, FOOT; TRANSMETATARSAL right 2nd, 3rd, 4th,5th metatarsal;  Surgeon: Garnette Lonni Craven, DPM;  Location: MAIN OR [facility];  Service: Vascular  . PR AMPUTATION TOE,MT-P JT Bilateral 01/23/2020   Procedure: AMPUTATION, TOE; METATARSOPHALANGEAL JOINT;  Surgeon: Kayla Artist Setters, DPM;  Location[facility] MAIN [facility];  Service: Vascular    FAMILY HX: Family History  Problem Relation Age of Onset  . Stroke Mother     SOCIAL HX:  Social History   Socioeconomic History  . Marital status: Single    Spouse name: None  . Number of children: None  .  Years of education: None  . Highest education level: None  Tobacco Use  . Smoking status: Former    Types: Cigarettes    Quit date: 1998    Years since quitting: 25.5  . Smokeless tobacco: Current    Types: Chew  Vaping Use  . Vaping Use: Never used  Substance and Sexual Activity  . Alcohol use: Not Currently    Alcohol/week: 0.0 standard drinks  . Drug use: Not Currently  Social History Narrative    Lives by himself in a trailer home.  Niece reports patient has not been using any kind air-conditioning.  Able to take care of his daily ADLs, but very difficult.  Patient's sister and niece visit when they can.  Lives by himself in a trailer home.  Niece reports patient has not been using any kind air-conditioning.  Able to take care of his daily ADLs, but very difficult.  Patient's sister and niece visit when they can.  MEDICATIONS / ALLERGIES: Medications Prior to Admission  Medication Sig Dispense Refill Last Dose  . amiodarone  (PACERONE ) 200 MG tablet Take 0.5 tablets (100 mg total) by mouth daily. 5 days a week M-F     . apixaban  (ELIQUIS ) 2.5 mg Tab Take 1 tablet (2.5 mg total) by mouth Two (2) times a day.     . atorvastatin  (LIPITOR) 40 MG tablet Take 1 tablet (40 mg total) by mouth daily.     . albuterol  HFA 90 mcg/actuation inhaler Inhale 2 puffs every six (6) hours as needed.     . aspirin  (ECOTRIN) 81 MG tablet TAKE 1 TABLET BY MOUTH EVERY DAY 30 tablet 0   . carvediloL (COREG) 12.5 MG tablet Take 1 tablet (12.5 mg total) by mouth every twelve (12) hours.     . cholecalciferol, vitamin D3 25 mcg, 1,000 units,, 1,000 unit (25 mcg) tablet      . cloNIDine (CATAPRES-TTS) 0.1 mg/24 hr      . cloNIDine (CATAPRES-TTS) 0.3 mg/24 hr      . ENTRESTO  49-51 mg tablet Take 1 tablet by mouth Two (2) times a day.     . FEROSUL 325 mg (65 mg iron) tablet      . fluticasone  propion-salmeteroL (ADVAIR, WIXELA) 250-50 mcg/dose diskus Inhale 1 puff.     . isosorbide  mononitrate (IMDUR) 30 MG 24 hr tablet      . JARDIANCE 10 mg tablet      . mirtazapine  (REMERON ) 7.5 MG tablet      . olmesartan (BENICAR) 5 MG tablet      . senna (SENOKOT) 8.6 mg tablet Take 1 tablet by mouth nightly. 30 tablet 0   . sodium bicarbonate  650 mg tablet      . spironolactone  (ALDACTONE ) 25 MG tablet      . VITAMIN B-12 1000 MCG tablet        No Known Allergies  IMMUNIZATIONS: Immunization History  Administered  Date(s) Administered  . PPD Test 12/01/2019, 12/08/2019    REVIEW OF SYSTEMS: Pertinent positives and negatives per HPI. A complete review of systems otherwise negative.  PHYSICAL EXAM:  Initial ED Vitals:  ED Triage Vitals  Enc Vitals Group     BP 04/11/22 1652 156/50     Heart Rate 04/11/22 1652 (!) 30     SpO2 Pulse 04/11/22 2236 (!) 30     Resp 04/11/22 1652 18     Temp 04/11/22 1652 36.7 C (98.1 F)     Temp Source 04/11/22 1652 Oral  SpO2 04/11/22 1652 99 %     Weight 04/11/22 1652 74.8 kg (165 lb)     Height --      Head Circumference --      Peak Flow --      Pain Score --      Pain Loc --      Pain Edu? --      Excl. in GC? --     Recent Vitals: Vitals:   04/12/22 0000  BP: 109/56  Pulse: 50  Resp: 17  Temp:   SpO2: 99%    GEN: Well-appearing, lying in bed, NAD A&Ox3 Eyes: PERRL. Mild yellowing around sclera, but not scleral icterus. Conjunctiva non-erythematous. EOMI. HEENT: NCAT, MMM. Oropharynx clear. Neck: Supple. Lymphadenopathy: No cervical or supraclavicular LAD. CV: Bradycardic. No murmurs/rubs/gallops. No costochondral tenderness. No cyanosis or clubbing. Cap Refill < 2 secs. Pulm: CTAB. No wheezing, crackles, or rhonchi. Abd: Flat.  Nontender. No guarding, rebound.  Normoactive bowel sounds.   Neuro: A&O x 3. No focal deficits. Strength 5/5 UE/LE. Distal sensation to light touch intact. Ext: No peripheral edema. Difficult to palpate DP pulses. Right foot with toe amputation. Skin: No rashes or skin lesions on exposed skin.    LABS/ STUDIES: All imaging, laboratory studies, and other pertinent tests including electrocardiography were reviewed prior to admission and are summarized within the assessment and plan.     Jordan T Holmes, DO PGY1 April 11, 2022 11:51 PM

## 2023-08-26 ENCOUNTER — Encounter: Payer: Self-pay | Admitting: Ophthalmology

## 2023-08-26 NOTE — Anesthesia Preprocedure Evaluation (Addendum)
Anesthesia Evaluation  Patient identified by MRN, date of birth, ID band Patient awake    Reviewed: Allergy & Precautions, H&P , NPO status , Patient's Chart, lab work & pertinent test results  Airway Mallampati: III  TM Distance: >3 FB Neck ROM: Full    Dental no notable dental hx. (+) Edentulous Upper, Edentulous Lower   Pulmonary shortness of breath, asthma , COPD, former smoker   Pulmonary exam normal breath sounds clear to auscultation       Cardiovascular hypertension, + CAD, + Past MI and +CHF  Normal cardiovascular exam+ dysrhythmias  Rhythm:Irregular Rate:Normal  Echo 08/29/19: EF 25-30% with global HK. LVOT VTI 10 cm which equates to CO/CI of 2.0 L/min and 1.0 L/min/m2.     RHC/LHC 09/03/19   Ost LAD to Prox LAD lesion is 40% stenosed.  2nd Diag lesion is 60% stenosed.  Dist Cx lesion is 70% stenosed.  1st Mrg lesion is 30% stenosed.  Ost RCA to Prox RCA lesion is 30% stenosed.  RPAV lesion is 40% stenosed.  3rd RPL lesion is 60% stenosed.   08-29-19  1. Severely reduced LVEF (25-30%) with global HK. LVOT VTI 10 cm which  equates to CO/CI of 2.0 L/min and 1.0 L/min/m2.   2. Left ventricular ejection fraction, by visual estimation, is 25 to  30%. The left ventricle has severely decreased function. There is  borderline left ventricular hypertrophy.   3. Left ventricular diastolic parameters are consistent with Grade II  diastolic dysfunction (pseudonormalization).   4. The left ventricle demonstrates global hypokinesis.   5. Global right ventricle has severely reduced systolic function.The  right ventricular size is mildly enlarged. No increase in right  ventricular wall thickness.   6. Left atrial size was normal.   7. Right atrial size was mild-moderately dilated.   8. Presence of pericardial fat pad.   9. The pericardial effusion is circumferential.  10. Trivial pericardial effusion is present.  11. The  mitral valve is degenerative. Mild mitral valve regurgitation.  12. The tricuspid valve is grossly normal. Tricuspid valve regurgitation  moderate-severe.  13. The aortic valve is tricuspid. Aortic valve regurgitation is trivial.  Mild to moderate aortic valve sclerosis/calcification without any evidence  of aortic stenosis.  14. The pulmonic valve was grossly normal. Pulmonic valve regurgitation is  not visualized.  15. Normal pulmonary artery systolic pressure.  16. The inferior vena cava is dilated in size with <50% respiratory  variability, suggesting right atrial pressure of 15 mmHg.  17. The tricuspid regurgitant velocity is 1.89 m/s, and with an assumed  right atrial pressure of 15 mmHg, the estimated right ventricular systolic  pressure is normal at 29.3 mmHg.  18. Elevated left atrial pressure.   10-27-20 1. Non-obstructive CAD 2. Severe NICM with EF 25% by echo 3. VT and frequent PVCs 4. Well-compensated hemodynamics     Neuro/Psych negative neurological ROS  negative psych ROS   GI/Hepatic negative GI ROS, Neg liver ROS,,,  Endo/Other  diabetes    Renal/GU Renal diseasenegative Renal ROS  negative genitourinary   Musculoskeletal negative musculoskeletal ROS (+) Arthritis ,    Abdominal   Peds negative pediatric ROS (+)  Hematology negative hematology ROS (+)   Anesthesia Other Findings Asthma  COPD (chronic obstructive pulmonary disease) (HCC) Shortness of breath dyspnea  Coronary artery disease Hypertension  Myocardial infarction (HCC) Persistent Atrial fibrillation (HCC)  Stage IV severe Chronic kidney disease Hypercholesteremia  Swelling Dysrhythmia  Arthritis Diabetes mellitus without complication Full dentures  Reproductive/Obstetrics negative OB ROS                             Anesthesia Physical Anesthesia Plan  ASA: 4  Anesthesia Plan: MAC   Post-op Pain Management:    Induction: Intravenous  PONV  Risk Score and Plan:   Airway Management Planned: Natural Airway and Nasal Cannula  Additional Equipment:   Intra-op Plan:   Post-operative Plan:   Informed Consent: I have reviewed the patients History and Physical, chart, labs and discussed the procedure including the risks, benefits and alternatives for the proposed anesthesia with the patient or authorized representative who has indicated his/her understanding and acceptance.     Dental Advisory Given  Plan Discussed with: Anesthesiologist, CRNA and Surgeon  Anesthesia Plan Comments: (Patient consented for risks of anesthesia including but not limited to:  - adverse reactions to medications - damage to eyes, teeth, lips or other oral mucosa - nerve damage due to positioning  - sore throat or hoarseness - Damage to heart, brain, nerves, lungs, other parts of body or loss of life  Patient voiced understanding and assent.)        Anesthesia Quick Evaluation

## 2023-08-29 ENCOUNTER — Encounter: Payer: Self-pay | Admitting: Ophthalmology

## 2023-08-30 NOTE — Discharge Instructions (Signed)

## 2023-09-01 ENCOUNTER — Ambulatory Visit: Payer: Self-pay | Admitting: Anesthesiology

## 2023-09-01 ENCOUNTER — Ambulatory Visit
Admission: RE | Admit: 2023-09-01 | Discharge: 2023-09-01 | Disposition: A | Discharge status: Home / Self Care | Payer: Medicare (Managed Care) | Attending: Ophthalmology | Admitting: Ophthalmology

## 2023-09-01 ENCOUNTER — Other Ambulatory Visit: Payer: Self-pay

## 2023-09-01 ENCOUNTER — Encounter: Payer: Self-pay | Admitting: Ophthalmology

## 2023-09-01 ENCOUNTER — Encounter
Admission: RE | Disposition: A | Discharge status: Home / Self Care | Payer: Self-pay | Source: Home / Self Care | Attending: Ophthalmology

## 2023-09-01 DIAGNOSIS — D631 Anemia in chronic kidney disease: Secondary | ICD-10-CM | POA: Diagnosis not present

## 2023-09-01 DIAGNOSIS — E1122 Type 2 diabetes mellitus with diabetic chronic kidney disease: Secondary | ICD-10-CM | POA: Diagnosis not present

## 2023-09-01 DIAGNOSIS — J4489 Other specified chronic obstructive pulmonary disease: Secondary | ICD-10-CM | POA: Diagnosis not present

## 2023-09-01 DIAGNOSIS — I130 Hypertensive heart and chronic kidney disease with heart failure and stage 1 through stage 4 chronic kidney disease, or unspecified chronic kidney disease: Secondary | ICD-10-CM | POA: Diagnosis not present

## 2023-09-01 DIAGNOSIS — I083 Combined rheumatic disorders of mitral, aortic and tricuspid valves: Secondary | ICD-10-CM | POA: Diagnosis not present

## 2023-09-01 DIAGNOSIS — E1136 Type 2 diabetes mellitus with diabetic cataract: Secondary | ICD-10-CM | POA: Diagnosis present

## 2023-09-01 DIAGNOSIS — Z87891 Personal history of nicotine dependence: Secondary | ICD-10-CM | POA: Insufficient documentation

## 2023-09-01 DIAGNOSIS — I2510 Atherosclerotic heart disease of native coronary artery without angina pectoris: Secondary | ICD-10-CM | POA: Insufficient documentation

## 2023-09-01 DIAGNOSIS — I5042 Chronic combined systolic (congestive) and diastolic (congestive) heart failure: Secondary | ICD-10-CM | POA: Diagnosis not present

## 2023-09-01 DIAGNOSIS — I252 Old myocardial infarction: Secondary | ICD-10-CM | POA: Insufficient documentation

## 2023-09-01 DIAGNOSIS — H2512 Age-related nuclear cataract, left eye: Secondary | ICD-10-CM | POA: Diagnosis not present

## 2023-09-01 DIAGNOSIS — M1990 Unspecified osteoarthritis, unspecified site: Secondary | ICD-10-CM | POA: Insufficient documentation

## 2023-09-01 DIAGNOSIS — N184 Chronic kidney disease, stage 4 (severe): Secondary | ICD-10-CM | POA: Diagnosis not present

## 2023-09-01 HISTORY — DX: Chronic kidney disease, unspecified: N18.9

## 2023-09-01 HISTORY — DX: Gout, unspecified: M10.9

## 2023-09-01 HISTORY — DX: Personal history of diseases of the blood and blood-forming organs and certain disorders involving the immune mechanism: Z86.2

## 2023-09-01 HISTORY — PX: CATARACT EXTRACTION W/PHACO: SHX586

## 2023-09-01 HISTORY — DX: Chronic combined systolic (congestive) and diastolic (congestive) heart failure: I50.42

## 2023-09-01 HISTORY — DX: Chronic kidney disease, stage 4 (severe): N18.4

## 2023-09-01 LAB — GLUCOSE, CAPILLARY: Glucose-Capillary: 104 mg/dL — ABNORMAL HIGH (ref 70–99)

## 2023-09-01 SURGERY — PHACOEMULSIFICATION, CATARACT, WITH IOL INSERTION
Anesthesia: Monitor Anesthesia Care | Laterality: Left

## 2023-09-01 MED ORDER — TETRACAINE HCL 0.5 % OP SOLN
OPHTHALMIC | Status: AC
  Filled 2023-09-01: qty 4

## 2023-09-01 MED ORDER — FENTANYL CITRATE (PF) 100 MCG/2ML IJ SOLN
INTRAMUSCULAR | Status: DC | PRN
  Administered 2023-09-01: 50 ug via INTRAVENOUS

## 2023-09-01 MED ORDER — SIGHTPATH DOSE#1 BSS IO SOLN
INTRAOCULAR | Status: DC | PRN
  Administered 2023-09-01: 110 mL via OPHTHALMIC

## 2023-09-01 MED ORDER — BRIMONIDINE TARTRATE-TIMOLOL 0.2-0.5 % OP SOLN
OPHTHALMIC | Status: DC | PRN
  Administered 2023-09-01: 1 [drp] via OPHTHALMIC

## 2023-09-01 MED ORDER — SIGHTPATH DOSE#1 BSS IO SOLN
INTRAOCULAR | Status: DC | PRN
  Administered 2023-09-01: 15 mL via INTRAOCULAR

## 2023-09-01 MED ORDER — LIDOCAINE HCL (PF) 2 % IJ SOLN
INTRAOCULAR | Status: DC | PRN
  Administered 2023-09-01: 1 mL via INTRAOCULAR

## 2023-09-01 MED ORDER — SIGHTPATH DOSE#1 NA HYALUR & NA CHOND-NA HYALUR IO KIT
PACK | INTRAOCULAR | Status: DC | PRN
  Administered 2023-09-01: 1 via OPHTHALMIC

## 2023-09-01 MED ORDER — TETRACAINE 0.5 % OP SOLN OPTIME - NO CHARGE
OPHTHALMIC | Status: DC | PRN
  Administered 2023-09-01: 1 [drp] via OPHTHALMIC

## 2023-09-01 MED ORDER — ARMC OPHTHALMIC DILATING DROPS
OPHTHALMIC | Status: AC
  Filled 2023-09-01: qty 0.5

## 2023-09-01 MED ORDER — ARMC OPHTHALMIC DILATING DROPS
1.0000 | OPHTHALMIC | Status: DC | PRN
  Administered 2023-09-01 (×3): 1 via OPHTHALMIC

## 2023-09-01 MED ORDER — MOXIFLOXACIN HCL 0.5 % OP SOLN
OPHTHALMIC | Status: DC | PRN
  Administered 2023-09-01: .2 mL via OPHTHALMIC

## 2023-09-01 MED ORDER — GLYCOPYRROLATE 0.2 MG/ML IJ SOLN
INTRAMUSCULAR | Status: AC
  Filled 2023-09-01: qty 1

## 2023-09-01 MED ORDER — FENTANYL CITRATE (PF) 100 MCG/2ML IJ SOLN
INTRAMUSCULAR | Status: AC
  Filled 2023-09-01: qty 2

## 2023-09-01 MED ORDER — GLYCOPYRROLATE 0.2 MG/ML IJ SOLN
INTRAMUSCULAR | Status: DC | PRN
  Administered 2023-09-01 (×2): .1 mg via INTRAVENOUS

## 2023-09-01 MED ORDER — TETRACAINE HCL 0.5 % OP SOLN
1.0000 [drp] | OPHTHALMIC | Status: DC | PRN
  Administered 2023-09-01 (×3): 1 [drp] via OPHTHALMIC

## 2023-09-01 SURGICAL SUPPLY — 9 items
CATARACT SUITE SIGHTPATH (MISCELLANEOUS) ×1
DISSECTOR HYDRO NUCLEUS 50X22 (MISCELLANEOUS) ×1 IMPLANT
DRSG TEGADERM 2-3/8X2-3/4 SM (GAUZE/BANDAGES/DRESSINGS) ×1 IMPLANT
FEE CATARACT SUITE SIGHTPATH (MISCELLANEOUS) ×1 IMPLANT
GLOVE SURG SYN 7.5 E (GLOVE) ×1
GLOVE SURG SYN 7.5 PF PI (GLOVE) ×1 IMPLANT
GLOVE SURG SYN 8.5 E (GLOVE) ×1
GLOVE SURG SYN 8.5 PF PI (GLOVE) ×1 IMPLANT
LENS IOL TECNIS EYHANCE 22.0 (Intraocular Lens) IMPLANT

## 2023-09-01 NOTE — Op Note (Signed)
OPERATIVE NOTE  HOLMES PILOTTI [id number] 09/01/2023   PREOPERATIVE DIAGNOSIS: Nuclear sclerotic cataract left eye. H25.12   POSTOPERATIVE DIAGNOSIS: Nuclear sclerotic cataract left eye. H25.12   PROCEDURE:  Phacoemusification with posterior chamber intraocular lens placement of the left eye  Ultrasound time: Procedure(s): CATARACT EXTRACTION PHACO AND INTRAOCULAR LENS PLACEMENT (IOC) LEFT DIABETIC MALYUGIN VISION BLUE OMDIRIA 20.18 01:54.2 (Left)  LENS:   Implant Name Type Inv. Item Serial No. Manufacturer Lot No. LRB No. Used Action  LENS IOL TECNIS EYHANCE 22.0 - [id number] Intraocular Lens LENS IOL TECNIS EYHANCE 22.0 [id number] [employer]  Left 1 Implanted      SURGEON:  Julious Payer. Rolley Sims, MD   ANESTHESIA:  Topical with tetracaine drops, augmented with 1% preservative-free intracameral lidocaine.   COMPLICATIONS:  None.   DESCRIPTION OF PROCEDURE:  The patient was identified in the holding room and transported to the operating room and placed in the supine position under the operating microscope.  The left eye was identified as the operative eye, which was prepped and draped in the usual sterile ophthalmic fashion.   A 1 millimeter clear-corneal paracentesis was made inferotemporally. Preservative-free 1% lidocaine mixed with 1:1,000 bisulfite-free aqueous solution of epinephrine was injected into the anterior chamber. The anterior chamber was then filled with Viscoat viscoelastic. A 2.4 millimeter keratome was used to make a clear-corneal incision superotemporally. A curvilinear capsulorrhexis was made with a cystotome and capsulorrhexis forceps. Balanced salt solution was used to hydrodissect and hydrodelineate the nucleus. Phacoemulsification was then used to remove the lens nucleus and epinucleus. The remaining cortex was then removed using the irrigation and aspiration handpiece. Provisc was then placed into the capsular bag to distend it for lens placement. A +22.00 D DIB00  intraocular lens was then injected into the capsular bag. The remaining viscoelastic was aspirated.   Wounds were hydrated with balanced salt solution.  The anterior chamber was inflated to a physiologic pressure with balanced salt solution.  No wound leaks were noted. Vigamox was injected intracamerally.  Timolol and Brimonidine drops were applied to the eye.  The patient was taken to the recovery room in stable condition without complications of anesthesia or surgery.  Rolly Pancake [place] 09/01/2023, 11:14 AM

## 2023-09-01 NOTE — Anesthesia Postprocedure Evaluation (Signed)
Anesthesia Post Note  Patient: [name] [name]  Procedure(s) Performed: CATARACT EXTRACTION PHACO AND INTRAOCULAR LENS PLACEMENT (IOC) LEFT DIABETIC MALYUGIN VISION BLUE OMDIRIA 20.18 01:54.2 (Left)  Patient location during evaluation: PACU Anesthesia Type: MAC Level of consciousness: awake and alert Pain management: pain level controlled Vital Signs Assessment: post-procedure vital signs reviewed and stable Respiratory status: spontaneous breathing, nonlabored ventilation, respiratory function stable and patient connected to nasal cannula oxygen Cardiovascular status: stable and blood pressure returned to baseline Postop Assessment: no apparent nausea or vomiting Anesthetic complications: no   No notable events documented.   Last Vitals:  Vitals:   09/01/23 1116 09/01/23 1120  BP: (!) 128/58 118/65  Pulse: (!) 47 (!) 51  Resp: 18 16  Temp: 36.5 C 36.5 C  SpO2: 99% 100%    Last Pain:  Vitals:   09/01/23 1120  TempSrc:   PainSc: 0-No pain                 Cyprian Gong[place] C Cruzita Lipa

## 2023-09-01 NOTE — H&P (Signed)
[facility]   Primary Care Physician:  Gorden Harms, PA-C Ophthalmologist: Dr. Deberah Pelton  Pre-Procedure History & Physical: HPI:  [name] [name] is a 88 y.o. male here for cataract surgery.   Past Medical History:  Diagnosis Date   Arthritis    shoulder   Asthma    Atrial fibrillation (HCC)    Chronic combined systolic and diastolic CHF (congestive heart failure) (HCC)    Chronic kidney disease, stage IV (severe) (HCC)    COPD (chronic obstructive pulmonary disease) (HCC)    Coronary artery disease    Diabetes mellitus without complication (HCC)    type 2   Dysrhythmia    PER [facility] HEART IRREGULARLY IRREGULAR   Full dentures    upper and lower   Gout    History of anemia due to chronic kidney disease    Hypercholesteremia    Hypertension    Myocardial infarction (HCC)    Shortness of breath dyspnea    Swelling    FEET AND LEGS    Past Surgical History:  Procedure Laterality Date   CARDIAC CATHETERIZATION     CATARACT EXTRACTION W/PHACO Right 01/28/2016   Procedure: CATARACT EXTRACTION PHACO AND INTRAOCULAR LENS PLACEMENT (IOC) right eye;  Surgeon: Lockie Mola, MD;  Location: [facility];  Service: Ophthalmology;  Laterality: Right;  DIABETIC-oral med MALYUGIN   RETINAL DETACHMENT SURGERY     RIGHT/LEFT HEART CATH AND CORONARY ANGIOGRAPHY N/A 09/03/2019   Procedure: RIGHT/LEFT HEART CATH AND CORONARY ANGIOGRAPHY;  Surgeon: Dolores Patty, MD;  Location: [facility];  Service: Cardiovascular;  Laterality: N/A;    Prior to Admission medications   Medication Sig Start Date End Date Taking? Authorizing Provider  amLODipine (NORVASC) 10 MG tablet Take 10 mg by mouth daily.   Yes [provider]  cloNIDine (CATAPRES - DOSED IN MG/24 HR) 0.1 mg/24hr patch Place 0.1 mg onto the skin once a week.   Yes [provider]  cyanocobalamin (VITAMIN B12) 1000 MCG tablet Take 1,000 mcg by mouth once a week.  Fridays   Yes [provider]  ferrous sulfate 325 (65 FE) MG tablet Take 325 mg by mouth 3 (three) times a week. M, W, F   Yes [provider]  olmesartan (BENICAR) 20 MG tablet Take 20 mg by mouth daily.   Yes [provider]  rivaroxaban (XARELTO) 10 MG TABS tablet Take 10 mg by mouth daily.   Yes [provider]  vitamin D3 (CHOLECALCIFEROL) 25 MCG tablet Take 1,000 Units by mouth daily.   Yes [provider]    Allergies as of 08/01/2023   (No Known Allergies)    Family History  Problem Relation Age of Onset   Stroke Mother     Social History   Socioeconomic History   Marital status: Single    Spouse name: Not on file   Number of children: Not on file   Years of education: Not on file   Highest education level: Not on file  Occupational History   Not on file  Tobacco Use   Smoking status: Former    Current packs/day: 0.25    Average packs/day: 0.3 packs/day for 50.0 years (12.5 ttl pk-yrs)    Types: Cigarettes   Smokeless tobacco: Current    Types: Chew  Substance and Sexual Activity   Alcohol use: No   Drug use: No   Sexual activity: Not on file  Other Topics Concern   Not on file  Social History Narrative   Not on file   Social Drivers of Health   Financial Resource Strain: Low Risk  (04/13/2022)   Received from [facility]   Overall Financial Resource Strain (CARDIA)    Difficulty of Paying Living Expenses: Not hard at all  Food Insecurity: No Food Insecurity (04/13/2022)   Received from [facility]   Hunger Vital Sign    Worried About Running Out of Food in the Last Year: Never true    Ran Out of Food in the Last Year: Never true  Transportation Needs: No Transportation Needs (04/13/2022)   Received from [facility]    Lack of Transportation (Medical): No    Lack of Transportation (Non-Medical): No  Physical Activity: Not on file  Stress: Not on file  Social Connections:  Not on file  Intimate Partner Violence: Not on file    Review of Systems: See HPI, otherwise negative ROS  Physical Exam: Ht 5\' 9"  (1.753 m)   Wt 70.8 kg   BMI 23.04 kg/m  General:   Alert, cooperative in NAD Head:  Normocephalic and atraumatic. Respiratory:  Normal work of breathing. Cardiovascular:  RRR  Impression/Plan: [name] [name] is here for cataract surgery.  Risks, benefits, limitations, and alternatives regarding cataract surgery have been reviewed with the patient.  Questions have been answered.  All parties agreeable.   Estanislado Pandy, MD  09/01/2023, 7:13 AM

## 2023-09-01 NOTE — Transfer of Care (Signed)
Immediate Anesthesia Transfer of Care Note  Patient: [name] [name]  Procedure(s) Performed: CATARACT EXTRACTION PHACO AND INTRAOCULAR LENS PLACEMENT (IOC) LEFT DIABETIC MALYUGIN VISION BLUE OMDIRIA 20.18 01:54.2 (Left)  Patient Location: PACU  Anesthesia Type: MAC  Level of Consciousness: awake, alert  and patient cooperative  Airway and Oxygen Therapy: Patient Spontanous Breathing and Patient connected to supplemental oxygen  Post-op Assessment: Post-op Vital signs reviewed, Patient's Cardiovascular Status Stable, Respiratory Function Stable, Patent Airway and No signs of Nausea or vomiting  Post-op Vital Signs: Reviewed and stable  Complications: No notable events documented.

## 2023-09-02 ENCOUNTER — Encounter: Payer: Self-pay | Admitting: Ophthalmology

## 2024-08-15 ENCOUNTER — Inpatient Hospital Stay

## 2024-08-15 ENCOUNTER — Inpatient Hospital Stay
Admission: EM | Admit: 2024-08-15 | Discharge: 2024-08-21 | DRG: 872 | Disposition: A | Attending: Internal Medicine | Admitting: Internal Medicine

## 2024-08-15 ENCOUNTER — Emergency Department

## 2024-08-15 ENCOUNTER — Other Ambulatory Visit: Payer: Self-pay

## 2024-08-15 DIAGNOSIS — I4891 Unspecified atrial fibrillation: Secondary | ICD-10-CM | POA: Diagnosis present

## 2024-08-15 DIAGNOSIS — K810 Acute cholecystitis: Secondary | ICD-10-CM | POA: Diagnosis present

## 2024-08-15 DIAGNOSIS — N390 Urinary tract infection, site not specified: Secondary | ICD-10-CM

## 2024-08-15 DIAGNOSIS — I10 Essential (primary) hypertension: Secondary | ICD-10-CM | POA: Diagnosis not present

## 2024-08-15 DIAGNOSIS — N179 Acute kidney failure, unspecified: Secondary | ICD-10-CM | POA: Diagnosis present

## 2024-08-15 DIAGNOSIS — R1084 Generalized abdominal pain: Principal | ICD-10-CM

## 2024-08-15 DIAGNOSIS — N1832 Chronic kidney disease, stage 3b: Secondary | ICD-10-CM | POA: Diagnosis present

## 2024-08-15 DIAGNOSIS — Z23 Encounter for immunization: Secondary | ICD-10-CM | POA: Diagnosis present

## 2024-08-15 DIAGNOSIS — J449 Chronic obstructive pulmonary disease, unspecified: Secondary | ICD-10-CM | POA: Diagnosis not present

## 2024-08-15 DIAGNOSIS — I5022 Chronic systolic (congestive) heart failure: Secondary | ICD-10-CM | POA: Diagnosis not present

## 2024-08-15 DIAGNOSIS — I509 Heart failure, unspecified: Secondary | ICD-10-CM

## 2024-08-15 LAB — MAGNESIUM
Magnesium: 2.2 mg/dL (ref 1.7–2.4)
Magnesium: 2.5 mg/dL — ABNORMAL HIGH (ref 1.7–2.4)

## 2024-08-15 LAB — URINALYSIS, W/ REFLEX TO CULTURE (INFECTION SUSPECTED)
Bilirubin Urine: NEGATIVE
Glucose, UA: >=500 mg/dL — AB
Ketones, ur: NEGATIVE mg/dL
Nitrite: NEGATIVE
Protein, ur: 30 mg/dL — AB
Specific Gravity, Urine: 1.018 (ref 1.005–1.030)
pH: 5 (ref 5.0–8.0)

## 2024-08-15 LAB — CBC WITH DIFFERENTIAL/PLATELET
Abs Immature Granulocytes: 0.31 K/uL — ABNORMAL HIGH (ref 0.00–0.07)
Basophils Absolute: 0.1 K/uL (ref 0.0–0.1)
Basophils Relative: 0 %
Eosinophils Absolute: 0.3 K/uL (ref 0.0–0.5)
Eosinophils Relative: 2 %
HCT: 42.4 % (ref 39.0–52.0)
Hemoglobin: 13.9 g/dL (ref 13.0–17.0)
Immature Granulocytes: 2 %
Lymphocytes Relative: 4 %
Lymphs Abs: 0.7 K/uL (ref 0.7–4.0)
MCH: 28.1 pg (ref 26.0–34.0)
MCHC: 32.8 g/dL (ref 30.0–36.0)
MCV: 85.7 fL (ref 80.0–100.0)
Monocytes Absolute: 1.1 K/uL — ABNORMAL HIGH (ref 0.1–1.0)
Monocytes Relative: 6 %
Neutro Abs: 16 K/uL — ABNORMAL HIGH (ref 1.7–7.7)
Neutrophils Relative %: 86 %
Platelets: 214 K/uL (ref 150–400)
RBC: 4.95 MIL/uL (ref 4.22–5.81)
RDW: 16.1 % — ABNORMAL HIGH (ref 11.5–15.5)
WBC: 18.5 K/uL — ABNORMAL HIGH (ref 4.0–10.5)
nRBC: 0 % (ref 0.0–0.2)

## 2024-08-15 LAB — COMPREHENSIVE METABOLIC PANEL WITH GFR
ALT: 9 U/L (ref 0–44)
AST: 16 U/L (ref 15–41)
Albumin: 3.4 g/dL — ABNORMAL LOW (ref 3.5–5.0)
Alkaline Phosphatase: 63 U/L (ref 38–126)
Anion gap: 15 (ref 5–15)
BUN: 34 mg/dL — ABNORMAL HIGH (ref 8–23)
CO2: 21 mmol/L — ABNORMAL LOW (ref 22–32)
Calcium: 8.3 mg/dL — ABNORMAL LOW (ref 8.9–10.3)
Chloride: 106 mmol/L (ref 98–111)
Creatinine, Ser: 3.18 mg/dL — ABNORMAL HIGH (ref 0.61–1.24)
GFR, Estimated: 18 mL/min — ABNORMAL LOW (ref >60)
Glucose, Bld: 217 mg/dL — ABNORMAL HIGH (ref 70–99)
Potassium: 4.1 mmol/L (ref 3.5–5.1)
Sodium: 142 mmol/L (ref 135–145)
Total Bilirubin: 0.8 mg/dL (ref 0.0–1.2)
Total Protein: 7.3 g/dL (ref 6.5–8.1)

## 2024-08-15 LAB — LACTIC ACID, PLASMA
Lactic Acid, Venous: 3.6 mmol/L (ref 0.5–1.9)
Lactic Acid, Venous: 2 mmol/L (ref 0.5–1.9)

## 2024-08-15 LAB — PROTIME-INR
INR: 1.5 — ABNORMAL HIGH (ref 0.8–1.2)
Prothrombin Time: 18.9 s — ABNORMAL HIGH (ref 11.4–15.2)

## 2024-08-15 LAB — PRO BRAIN NATRIURETIC PEPTIDE: Pro Brain Natriuretic Peptide: 4348 pg/mL — ABNORMAL HIGH (ref <300.0)

## 2024-08-15 LAB — LIPASE, BLOOD: Lipase: <10 U/L — ABNORMAL LOW (ref 11–51)

## 2024-08-15 LAB — TROPONIN T, HIGH SENSITIVITY
Troponin T High Sensitivity: 38 ng/L — ABNORMAL HIGH (ref 0–19)
Troponin T High Sensitivity: 39 ng/L — ABNORMAL HIGH (ref 0–19)

## 2024-08-15 LAB — HEPARIN LEVEL (UNFRACTIONATED): Heparin Unfractionated: >1.1 [IU]/mL — ABNORMAL HIGH (ref 0.30–0.70)

## 2024-08-15 LAB — TSH: TSH: 0.953 u[IU]/mL (ref 0.350–4.500)

## 2024-08-15 LAB — APTT: aPTT: 49 s — ABNORMAL HIGH (ref 24–36)

## 2024-08-15 MED ORDER — SODIUM CHLORIDE 0.9 % IV BOLUS
1000.0000 mL | Freq: Once | INTRAVENOUS | Status: AC
  Administered 2024-08-15: 1000 mL via INTRAVENOUS

## 2024-08-15 MED ORDER — APIXABAN 2.5 MG PO TABS
2.5000 mg | ORAL_TABLET | Freq: Two times a day (BID) | ORAL | Status: DC
  Filled 2024-08-15: qty 1

## 2024-08-15 MED ORDER — HEPARIN (PORCINE) 25000 UT/250ML-% IV SOLN
1100.0000 [IU]/h | INTRAVENOUS | Status: DC
  Administered 2024-08-15 – 2024-08-17 (×3): 1100 [IU]/h via INTRAVENOUS
  Filled 2024-08-15 (×3): qty 250

## 2024-08-15 MED ORDER — SODIUM CHLORIDE 0.9 % IV SOLN
2.0000 g | Freq: Once | INTRAVENOUS | Status: AC
  Administered 2024-08-15: 2 g via INTRAVENOUS
  Filled 2024-08-15: qty 20

## 2024-08-15 MED ORDER — AMIODARONE HCL IN DEXTROSE 360-4.14 MG/200ML-% IV SOLN
60.0000 mg/h | INTRAVENOUS | Status: AC
  Administered 2024-08-15 (×2): 60 mg/h via INTRAVENOUS
  Filled 2024-08-15: qty 200

## 2024-08-15 MED ORDER — DILTIAZEM HCL 25 MG/5ML IV SOLN
10.0000 mg | Freq: Once | INTRAVENOUS | Status: AC
  Administered 2024-08-15: 10 mg via INTRAVENOUS
  Filled 2024-08-15: qty 5

## 2024-08-15 MED ORDER — SENNOSIDES-DOCUSATE SODIUM 8.6-50 MG PO TABS
2.0000 | ORAL_TABLET | Freq: Two times a day (BID) | ORAL | Status: DC
  Administered 2024-08-15 – 2024-08-21 (×8): 2 via ORAL
  Filled 2024-08-15 (×11): qty 2

## 2024-08-15 MED ORDER — ACETAMINOPHEN 325 MG PO TABS: 650.0000 mg | ORAL_TABLET | Freq: Four times a day (QID) | ORAL | Status: DC | PRN

## 2024-08-15 MED ORDER — AMIODARONE HCL IN DEXTROSE 360-4.14 MG/200ML-% IV SOLN
30.0000 mg/h | INTRAVENOUS | Status: DC
  Administered 2024-08-16 – 2024-08-20 (×10): 30 mg/h via INTRAVENOUS
  Filled 2024-08-15 (×10): qty 200

## 2024-08-15 MED ORDER — METOPROLOL TARTRATE 5 MG/5ML IV SOLN
5.0000 mg | Freq: Once | INTRAVENOUS | Status: AC
  Administered 2024-08-15: 5 mg via INTRAVENOUS
  Filled 2024-08-15: qty 5

## 2024-08-15 MED ORDER — HEPARIN BOLUS VIA INFUSION
4000.0000 [IU] | Freq: Once | INTRAVENOUS | Status: AC
  Administered 2024-08-15: 4000 [IU] via INTRAVENOUS
  Filled 2024-08-15: qty 4000

## 2024-08-15 MED ORDER — SODIUM CHLORIDE 0.9% FLUSH
3.0000 mL | Freq: Two times a day (BID) | INTRAVENOUS | Status: DC
  Administered 2024-08-15 – 2024-08-17 (×5): 3 mL via INTRAVENOUS

## 2024-08-15 MED ORDER — LACTATED RINGERS IV SOLN
150.0000 mL/h | INTRAVENOUS | Status: DC
  Administered 2024-08-15: 150 mL/h via INTRAVENOUS

## 2024-08-15 MED ORDER — METRONIDAZOLE 500 MG/100ML IV SOLN
500.0000 mg | Freq: Once | INTRAVENOUS | Status: AC
  Administered 2024-08-15: 500 mg via INTRAVENOUS
  Filled 2024-08-15: qty 100

## 2024-08-15 MED ORDER — ACETAMINOPHEN 650 MG RE SUPP: 650.0000 mg | Freq: Four times a day (QID) | RECTAL | Status: DC | PRN

## 2024-08-15 MED ORDER — AMIODARONE LOAD VIA INFUSION
150.0000 mg | Freq: Once | INTRAVENOUS | Status: AC
  Administered 2024-08-15: 150 mg via INTRAVENOUS
  Filled 2024-08-15: qty 83.34

## 2024-08-15 MED ORDER — PIPERACILLIN-TAZOBACTAM IN DEX 2-0.25 GM/50ML IV SOLN
2.2500 g | Freq: Three times a day (TID) | INTRAVENOUS | Status: DC
  Administered 2024-08-15 – 2024-08-17 (×5): 2.25 g via INTRAVENOUS
  Filled 2024-08-15 (×8): qty 50

## 2024-08-15 MED ORDER — LACTATED RINGERS IV SOLN: INTRAVENOUS | Status: AC

## 2024-08-15 NOTE — ED Provider Notes (Signed)
 CT abdomen pelvis concerning for acute cholecystitis.  On my exam patient is most tender in the right upper quadrant.  Discussed with Dr. Marinda with surgery.  At this time recommended admission for IV antibiotics and reassessment in the morning.  Patient would have risk factors for surgical intervention and might benefit from percutaneous drain if antibiotics are not sufficient.  Will discuss with the hospitalist service for admission.   Floy Roberts, MD 08/15/24 [phone]

## 2024-08-15 NOTE — ED Triage Notes (Addendum)
 Pt arrives from [facility] via [facility] with c/o abd pain. Per EMS, pt had a regular bowel movement yesterday and has not eaten since Saturday. Per EMS, pt has a hx of A-fib, CHF, and HTN, unknown if pt is compliant with meds. Per EMS, pt had uncontrollable A-fib on the way to [facility] with HR between 150-190BPM. Pt states abd pain started on Thanksgiving and reports having no appetite. Pt rates abd pain 7/10 at this time. Pt's HR between 130-162 and BP 130/74 (83) at this time. Pt is A&Ox4.

## 2024-08-15 NOTE — ED Notes (Signed)
 Called [facility] to add pt to monitoring.

## 2024-08-15 NOTE — ED Notes (Signed)
 [facility] called and said they were sending this patient for weakness, abdominal pain and dehydration. Last ate on Saturday. Creatinine at baseline around 2.5. Lost 11lb in last month. History A-fib   Secure chat sent to EDP and primary nurse

## 2024-08-15 NOTE — ED Provider Notes (Signed)
 [facility] Provider Note    Event Date/Time   First MD Initiated Contact with Patient 08/15/24 1159     (approximate)   History   Abdominal Pain   HPI  [name] [name] is a 89 year old male with history of hypertension, CKD, CAD, A-fib, COPD presenting to the emergency department for evaluation of abdominal pain.  Patient reports that since Thanksgiving he has not had an appetite.  Facility reports he has not eaten since Saturday.  Reports generalized abdominal pain.  No vomiting or diarrhea.  Reviewed nephrology visit from October of this year.  Continue meds at that time.       Physical Exam   Triage Vital Signs: ED Triage Vitals  Encounter Vitals Group     BP 08/15/24 1206 130/74     Girls Systolic BP Percentile --      Girls Diastolic BP Percentile --      Boys Systolic BP Percentile --      Boys Diastolic BP Percentile --      Pulse Rate 08/15/24 1206 (!) 147     Resp 08/15/24 1206 (!) 23     Temp --      Temp Source 08/15/24 1206 Oral     SpO2 --      Weight 08/15/24 1208 166 lb 3.6 oz (75.4 kg)     Height 08/15/24 1208 5' 10 (1.778 m)     Head Circumference --      Peak Flow --      Pain Score 08/15/24 1208 7     Pain Loc --      Pain Education --      Exclude from Growth Chart --     Most recent vital signs: Vitals:   08/15/24 1430 08/15/24 1500  BP: 119/80 117/77  Pulse: (!) 115 98  Resp: (!) 29 (!) 29  Temp:    SpO2: 94% 94%     General: Awake, interactive  CV:  Good peripheral perfusion Resp:  Unlabored respirations, lungs clear to auscultation Abd:  Nondistended, soft, generalized tenderness palpation Neuro:  Symmetric facial movement, fluid speech   ED Results / Procedures / Treatments   Labs (all labs ordered are listed, but only abnormal results are displayed) Labs Reviewed  CBC WITH DIFFERENTIAL/PLATELET - Abnormal; Notable for the following components:      Result Value   WBC 18.5 (*)    RDW 16.1  (*)    Neutro Abs 16.0 (*)    Monocytes Absolute 1.1 (*)    Abs Immature Granulocytes 0.31 (*)    All other components within normal limits  URINALYSIS, W/ REFLEX TO CULTURE (INFECTION SUSPECTED) - Abnormal; Notable for the following components:   Color, Urine YELLOW (*)    APPearance HAZY (*)    Glucose, UA >=500 (*)    Hgb urine dipstick MODERATE (*)    Protein, ur 30 (*)    Leukocytes,Ua TRACE (*)    Bacteria, UA MANY (*)    All other components within normal limits  COMPREHENSIVE METABOLIC PANEL WITH GFR - Abnormal; Notable for the following components:   CO2 21 (*)    Glucose, Bld 217 (*)    BUN 34 (*)    Creatinine, Ser 3.18 (*)    Calcium  8.3 (*)    Albumin 3.4 (*)    GFR, Estimated 18 (*)    All other components within normal limits  LIPASE, BLOOD - Abnormal; Notable for the following components:  Lipase <10 (*)    All other components within normal limits  PRO BRAIN NATRIURETIC PEPTIDE - Abnormal; Notable for the following components:   Pro Brain Natriuretic Peptide 4,348.0 (*)    All other components within normal limits  TROPONIN T, HIGH SENSITIVITY - Abnormal; Notable for the following components:   Troponin T High Sensitivity 38 (*)    All other components within normal limits  URINE CULTURE  CULTURE, BLOOD (ROUTINE X 2)  CULTURE, BLOOD (ROUTINE X 2)  MAGNESIUM   LACTIC ACID, PLASMA  LACTIC ACID, PLASMA  TROPONIN T, HIGH SENSITIVITY     EKG EKG independently reviewed and interpreted by myself demonstrates:  EKG demonstrates A-fib at a rate of 150, QRS 80, QTc 466, no acute ST changes  RADIOLOGY Imaging independently reviewed and interpreted by myself demonstrates:  CT abdomen pelvis pending  Formal Radiology Read:  No results found.  PROCEDURES:  Critical Care performed: Yes, see critical care procedure note(s)  CRITICAL CARE Performed by: Nilsa Dade   Total critical care time: 31 minutes  Critical care time was exclusive of separately  billable procedures and treating other patients.  Critical care was necessary to treat or prevent imminent or life-threatening deterioration.  Critical care was time spent personally by me on the following activities: development of treatment plan with patient and/or surrogate as well as nursing, discussions with consultants, evaluation of patient's response to treatment, examination of patient, obtaining history from patient or surrogate, ordering and performing treatments and interventions, ordering and review of laboratory studies, ordering and review of radiographic studies, pulse oximetry and re-evaluation of patient's condition.   Procedures   MEDICATIONS ORDERED IN ED: Medications  cefTRIAXone (ROCEPHIN) 2 g in sodium chloride  0.9 % 100 mL IVPB (has no administration in time range)  metroNIDAZOLE  (FLAGYL ) IVPB 500 mg (has no administration in time range)  sodium chloride  0.9 % bolus 1,000 mL (0 mLs Intravenous Stopped 08/15/24 1343)  metoprolol  tartrate (LOPRESSOR ) injection 5 mg (5 mg Intravenous Given 08/15/24 1348)     IMPRESSION / MDM / ASSESSMENT AND PLAN / ED COURSE  I reviewed the triage vital signs and the nursing notes.  Differential diagnosis includes, but is not limited to, dehydration, bowel obstruction, colitis, other acute intra-abdominal process, anemia, electrolyte abnormality, arrhythmia  Patient's presentation is most consistent with acute presentation with potential threat to life or bodily function.  89 year old male presenting with several days of abdominal pain found to be in A-fib with RVR on presentation.  Question if some of this is compensatory so we will hold off on rate control for now and trial IV fluids.  If he has persistent or worsening tachycardia, may require rate control at that time.  Clinical Course as of 08/15/24 1538  Wed Aug 15, 2024  1335 Remains persistently tachycardic despite fluid resuscitation.  Will trial IV metoprolol .  Awaiting CT.  [NR]  1519 Heart rate improved to 110s after receiving metoprolol .  Urinalysis is concerning for infection.  Urine culture sent.  Does have AKI, postvoid residual ordered.  CT pending.  With leukocytosis and elevated heart rate, patient does meet SIRS criteria.  Will initiate empiric antibiotics while awaiting CT result.  Signed out to oncoming physician pending CT, likely admission. [NR]    Clinical Course User Index [NR] Dade Nilsa, MD     FINAL CLINICAL IMPRESSION(S) / ED DIAGNOSES   Final diagnoses:  Generalized abdominal pain  Urinary tract infection without hematuria, site unspecified     Rx / DC Orders  ED Discharge Orders     None        Note:  This document was prepared using Dragon voice recognition software and may include unintentional dictation errors.   Levander Slate, MD 08/15/24 [phone]

## 2024-08-15 NOTE — H&P (Addendum)
 History and Physical    [name] [name] FMW:[id number] DOB: 01-19-1935 DOA: 08/15/2024  DOS: the patient was seen and examined on 08/15/2024  PCP: [name] Kern, PA-C (Inactive)   [facility]: [facility]  I have personally briefly reviewed patient's old medical records in [facility] and CareEverywhere  HPI:   [name] [name] is a 89 y.o. year old male with medical history of hypertension, hyperlipidemia, atrial fibrillation on Xarelto, COPD presenting to the ED with abdominal pain.   Pt states he developed abdominal pain that has been present after Thanksgiving.  He states it is all over.  He does report some worsening with food intake.  Denies any nausea or vomiting.  States he has not had any fevers or chills.  States he primary care placed recommended he go to the ED for evaluation.  He reports good baseline activity level and is independent in ADLs and IADLs.   On arrival to the ED patient was noted to be HDS stable.  Lab work and imaging obtained.  CBC with significant leukocytosis at 18.5, baseline hemoglobin.  CMP with significant hyperglycemia, mild NAGMA and AKI on CKD 3A, lipase less than 10, magnesium  2.2, elevated proBNP at 4348, troponin slightly elevated but stable.  Lactic acid elevated at 3.6 with repeat pending.  UA with signs of infection and urine culture pending.  CT abdomen pelvis concerning for acute cholecystitis with some focal colitis along with ileus.  EDP discussed with general surgery who recommended conservative treatment with IV antibiotics and they will see tomorrow.  Given patient's presentation, [facility] consulted for admission.   Review of Systems: As mentioned in the history of present illness. All other systems reviewed and are negative.   Past Medical History:  Diagnosis Date   Arthritis    shoulder   Asthma    Atrial fibrillation (HCC)    Chronic combined systolic and diastolic CHF (congestive heart failure) (HCC)    Chronic kidney  disease, stage IV (severe) (HCC)    COPD (chronic obstructive pulmonary disease) (HCC)    Coronary artery disease    Diabetes mellitus without complication (HCC)    type 2   Dysrhythmia    [facility] HEART IRREGULARLY IRREGULAR   Full dentures    upper and lower   Gout    History of anemia due to chronic kidney disease    Hypercholesteremia    Hypertension    Myocardial infarction (HCC)    Shortness of breath dyspnea    Swelling    FEET AND LEGS    Past Surgical History:  Procedure Laterality Date   CARDIAC CATHETERIZATION     CATARACT EXTRACTION W/PHACO Right 01/28/2016   Procedure: CATARACT EXTRACTION PHACO AND INTRAOCULAR LENS PLACEMENT (IOC) right eye;  Surgeon: Dene Etienne, MD;  Location: [facility];  Service: Ophthalmology;  Laterality: Right;  DIABETIC-oral med MALYUGIN   CATARACT EXTRACTION W/PHACO Left 09/01/2023   Procedure: CATARACT EXTRACTION PHACO AND INTRAOCULAR LENS PLACEMENT (IOC) LEFT DIABETIC MALYUGIN VISION BLUE OMDIRIA 20.18 01:54.2;  Surgeon: Enola Feliciano Hugger, MD;  Location: [facility];  Service: Ophthalmology;  Laterality: Left;   RETINAL DETACHMENT SURGERY     RIGHT/LEFT HEART CATH AND CORONARY ANGIOGRAPHY N/A 09/03/2019   Procedure: RIGHT/LEFT HEART CATH AND CORONARY ANGIOGRAPHY;  Surgeon: Cherrie Toribio SAUNDERS, MD;  Location: [facility];  Service: Cardiovascular;  Laterality: N/A;     No Known Allergies  Family History  Problem Relation Age of Onset   Stroke Mother  Prior to Admission medications   Medication Sig Start Date End Date Taking? Authorizing Provider  amLODipine (NORVASC) 10 MG tablet Take 10 mg by mouth daily.    [provider]  cloNIDine (CATAPRES - DOSED IN MG/24 HR) 0.1 mg/24hr patch Place 0.1 mg onto the skin once a week.    [provider]  cyanocobalamin (VITAMIN B12) 1000 MCG tablet Take 1,000 mcg by mouth once a week. Fridays    [provider]   ferrous sulfate 325 (65 FE) MG tablet Take 325 mg by mouth 3 (three) times a week. M, W, F    [provider]  olmesartan (BENICAR) 20 MG tablet Take 20 mg by mouth daily.    [provider]  rivaroxaban (XARELTO) 10 MG TABS tablet Take 10 mg by mouth daily.    [provider]  vitamin D3 (CHOLECALCIFEROL) 25 MCG tablet Take 1,000 Units by mouth daily.    [provider]    Social History:  reports that he has quit smoking. His smoking use included cigarettes. He has a 12.5 pack-year smoking history. His smokeless tobacco use includes chew. He reports that he does not drink alcohol and does not use drugs. Lives by himself Tobacco-previous 30 pack year history EtOH- Denies use.  Illicit drug use- denies use.  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 Liver Function Tests: Recent Labs  Lab 08/15/24 1353  AST 16  ALT 9  ALKPHOS 63  BILITOT 0.8  PROT 7.3  ALBUMIN 3.4*   Recent Labs  Lab 08/15/24 1353  LIPASE <10*   No results for input(s): AMMONIA in the last 168 hours. Coagulation Profile: Recent Labs  Lab 08/15/24 1856  INR 1.5*   Cardiac Enzymes: No results for input(s): CKTOTAL, CKMB, CKMBINDEX, TROPONINI, TROPONINIHS in the last 168 hours. BNP (last 3 results) No results for input(s): BNP in the last 8760 hours. HbA1C: No results for input(s): HGBA1C in the last  72 hours. CBG: No results for input(s): GLUCAP in the last 168 hours. Lipid Profile: No results for input(s): CHOL, HDL, LDLCALC, TRIG, CHOLHDL, LDLDIRECT in the last 72 hours. Thyroid  Function Tests: Recent Labs    08/15/24 1605  TSH 0.953   Anemia Panel: No results for input(s): VITAMINB12, FOLATE, FERRITIN, TIBC, IRON, RETICCTPCT in the last 72 hours. Urine analysis:    Component Value Date/Time   COLORURINE YELLOW (A) 08/15/2024 1402   APPEARANCEUR HAZY (A) 08/15/2024 1402   LABSPEC 1.018 08/15/2024 1402   PHURINE 5.0 08/15/2024 1402   GLUCOSEU >=500 (A) 08/15/2024 1402   HGBUR MODERATE (A) 08/15/2024 1402   BILIRUBINUR NEGATIVE 08/15/2024 1402   KETONESUR NEGATIVE 08/15/2024 1402   PROTEINUR 30 (A) 08/15/2024 1402   NITRITE NEGATIVE 08/15/2024 1402   LEUKOCYTESUR TRACE (A) 08/15/2024 1402    Radiological Exams on Admission: I have personally reviewed images CT ABDOMEN PELVIS WO CONTRAST Result Date: 08/15/2024 CLINICAL DATA:  Acute abdominal pain EXAM: CT ABDOMEN AND PELVIS WITHOUT CONTRAST TECHNIQUE: Multidetector CT imaging of the abdomen and pelvis was performed following the standard protocol without IV contrast. RADIATION DOSE REDUCTION: This exam was performed according to the departmental dose-optimization program which  includes automated exposure control, adjustment of the mA and/or kV according to patient size and/or use of iterative reconstruction technique. COMPARISON:  None Available. FINDINGS: Lower chest: There is atelectasis in the right lung base. Hepatobiliary: The gallbladder is filled with gallstones. There is trace surrounding inflammatory stranding. There is no biliary ductal dilatation. The liver is within normal limits. Pancreas: Unremarkable. No pancreatic ductal dilatation or surrounding inflammatory changes. Spleen: Normal in size without focal abnormality. Adrenals/Urinary Tract: Adrenal glands are unremarkable. Kidneys are normal, without renal calculi, focal lesion, or hydronephrosis. Bladder is unremarkable. Stomach/Bowel: There is some mild wall thickening and inflammation of the hepatic flexure. Otherwise, the colon appears within normal limits. There some borderline dilated loops of small bowel throughout the central abdomen without definitive transition point. The appendix appears normal. No pneumatosis or free air. The stomach is within normal limits. Vascular/Lymphatic: Aortic atherosclerosis. No enlarged abdominal or pelvic lymph nodes. Reproductive: Prostate is unremarkable. Other: No abdominal wall hernia or abnormality. No abdominopelvic ascites. Musculoskeletal: No acute or significant osseous findings. IMPRESSION: 1. Cholelithiasis with trace surrounding inflammatory stranding. Findings are concerning for acute cholecystitis. 2. Mild wall thickening and inflammation of the hepatic flexure, likely reactive. 3. Borderline dilated loops of small bowel throughout the central abdomen without definitive transition point. Findings may represent ileus. 4. Aortic atherosclerosis. Aortic Atherosclerosis (ICD10-I70.0). Electronically Signed   By: Greig Pique M.D.   On: 08/15/2024 16:13    EKG: My personal interpretation of EKG shows: Atrial fibrillation with RVR  Assessment/Plan Principal Problem:    Acute cholecystitis Active Problems:   Atrial fibrillation with RVR (HCC)   Acute kidney injury superimposed on stage 3b chronic kidney disease (HCC)   Hypertension   COPD (chronic obstructive pulmonary disease) (HCC)   CHF (congestive heart failure) (HCC)   Patient with abdominal pain with imaging showing acute cholecystitis.  Patient meets sepsis criteria.  EDP discussed with general surgery who recommend conservative treatment with IV antibiotics and IVF.  If no improvement by tomorrow they will consider cholecystostomy for further interventions.  Appreciate their recommendations.  Will start patient on Zosyn.  Monitor blood cultures and leukocytosis.  Right upper quadrant ordered and pending.  Atrial fibrillation with RVR: Likely in the setting of sepsis from acute cholecystitis.  Patient has been on  amiodarone  and given he has previous history of reduced ejection fraction we will start him on IV amiodarone  as he is currently hemodynamic stable but can decompensate given active infection.  Start IV heparin  and hold Eliquis  given patient may need cholecystostomy versus cholecystectomy depending on neurosurgery recommendations.  Elevated lactic acid: Likely secondary to show problem.  Getting IVF.  Trending.  AKI on CKD: Getting IVF.  Order renal ultrasound as well.  Patient has UTI that has been treatment for.  Renally dose medications, avoid nephrotoxic agents.  UTI?  Patient without any symptoms but urinalysis suggestive of urinary tract infection.  Urine culture pending but patient is on Zosyn which should cover him.  CHF: Last echo in 2023 showed normal EF with diastolic dysfunction but he has previous echoes that show systolic dysfunction as well.  Patient is euvolemic on exam.  Will get a repeat echocardiogram.  Hypertension: Holding home meds given patient has an active infection  Hyperlipidemia: Not currently on therapy.  Will defer to outpatient to initiate this if needed but may  not be as beneficial given patient's advanced age  Type 2 diabetes mellitus: Will monitor CBG to start SSI if needed.  Patient currently NPO.  VTE prophylaxis:  Heparin  gtt.  Diet: N.p.o. Code Status:  Full Code Telemetry:  Admission status: Inpatient, Progressive Patient is from: Home Anticipated d/c is to: Home Anticipated d/c is in: 2-3 days   Family Communication: Updated at bedside  Consults called: EDP discussed with Dr. Marinda from general surgery   Severity of Illness: The appropriate patient status for this patient is INPATIENT. Inpatient status is judged to be reasonable and necessary in order to provide the required intensity of service to ensure the patient's safety. The patient's presenting symptoms, physical exam findings, and initial radiographic and laboratory data in the context of their chronic comorbidities is felt to place them at high risk for further clinical deterioration. Furthermore, it is not anticipated that the patient will be medically stable for discharge from the hospital within 2 midnights of admission.   * I certify that at the point of admission it is my clinical judgment that the patient will require inpatient hospital care spanning beyond 2 midnights from the point of admission due to high intensity of service, high risk for further deterioration and high frequency of surveillance required.[name] [name] Morene Bathe, MD [place] DEL. [facility]

## 2024-08-15 NOTE — Sepsis Progress Note (Signed)
 eLink is following this Code Sepsis.

## 2024-08-15 NOTE — Progress Notes (Signed)
 CODE SEPSIS - PHARMACY COMMUNICATION  **Broad Spectrum Antibiotics should be administered within 1 hour of Sepsis diagnosis**  Time Code Sepsis Called/Page Received: 15:38  Antibiotics Ordered: Ceftriaxone, flagyl   Time of 1st antibiotic administration: 16:31  Additional action taken by pharmacy: Messaged RN to see if abx were started yet and nurse let me know just gave the Ceftriaxone  If necessary, Name of Provider/Nurse Contacted: Amon Orchard, RN    Ransom Blanch PGY-1 Pharmacy Resident  [facility]  08/15/2024 4:00 PM

## 2024-08-15 NOTE — Progress Notes (Addendum)
 PHARMACY - ANTICOAGULATION CONSULT NOTE  Pharmacy Consult for Heparin   Indication: atrial fibrillation  No Known Allergies  Patient Measurements: Height: 5' 10 (177.8 cm) Weight: 75.4 kg (166 lb 3.6 oz) IBW/kg (Calculated) : 73 HEPARIN  DW (KG): 75.4  Vital Signs: Temp: 98.1 F (36.7 C) (12/03 2105) Temp Source: Oral (12/03 2105) BP: 146/89 (12/03 1800) Pulse Rate: 130 (12/03 1800)  Labs: Recent Labs    08/15/24 1220 08/15/24 1353 08/15/24 1856  HGB 13.9  --   --   HCT 42.4  --   --   PLT 214  --   --   APTT  --   --  49*  LABPROT  --   --  18.9*  INR  --   --  1.5*  CREATININE  --  3.18*  --     Estimated Creatinine Clearance: 16.9 mL/min (A) (by C-G formula based on SCr of 3.18 mg/dL (H)).   Medical History: Past Medical History:  Diagnosis Date   Arthritis    shoulder   Asthma    Atrial fibrillation (HCC)    Chronic combined systolic and diastolic CHF (congestive heart failure) (HCC)    Chronic kidney disease, stage IV (severe) (HCC)    COPD (chronic obstructive pulmonary disease) (HCC)    Coronary artery disease    Diabetes mellitus without complication (HCC)    type 2   Dysrhythmia    [facility] HEART IRREGULARLY IRREGULAR   Full dentures    upper and lower   Gout    History of anemia due to chronic kidney disease    Hypercholesteremia    Hypertension    Myocardial infarction (HCC)    Shortness of breath dyspnea    Swelling    FEET AND LEGS    Medications:  (Not in a hospital admission)   Assessment: Pharmacy consulted to dose heparin  in this 89 year old male admitted with Afib.  Pt was on Eliquis  2.5 mg PO BID PTA,  last dose previous week at unspecified time.  CrCl = 16.9 ml/min   Goal of Therapy:  Heparin  level 0.3-0.7 units/ml aPTT 66 - 102 seconds Monitor platelets by anticoagulation protocol: Yes   Plan:  Check baseline HL to r/o continued effect of PTA Eliquis  12/3:   HL @ 1856 = > 1.10 (baseline) -  PTA Eliquis   still having effect so will use aPTT to guide dosing until correlating with HL  Give 4000 units bolus x 1 Start heparin  infusion at 1100 units/hr Check anti-Xa level in 8 hours and daily while on heparin  Continue to monitor H&H and platelets - Check aPTT and HL 8 hrs after start of drip   Kellsey Sansone D 08/15/2024,10:19 PM

## 2024-08-16 ENCOUNTER — Inpatient Hospital Stay: Admit: 2024-08-16 | Discharge: 2024-08-16 | Disposition: A | Attending: Internal Medicine

## 2024-08-16 ENCOUNTER — Inpatient Hospital Stay

## 2024-08-16 DIAGNOSIS — I4891 Unspecified atrial fibrillation: Secondary | ICD-10-CM

## 2024-08-16 DIAGNOSIS — I5031 Acute diastolic (congestive) heart failure: Secondary | ICD-10-CM

## 2024-08-16 DIAGNOSIS — I10 Essential (primary) hypertension: Secondary | ICD-10-CM

## 2024-08-16 DIAGNOSIS — J449 Chronic obstructive pulmonary disease, unspecified: Secondary | ICD-10-CM

## 2024-08-16 DIAGNOSIS — I5022 Chronic systolic (congestive) heart failure: Secondary | ICD-10-CM

## 2024-08-16 DIAGNOSIS — K810 Acute cholecystitis: Secondary | ICD-10-CM | POA: Diagnosis not present

## 2024-08-16 DIAGNOSIS — N179 Acute kidney failure, unspecified: Secondary | ICD-10-CM

## 2024-08-16 DIAGNOSIS — N1832 Chronic kidney disease, stage 3b: Secondary | ICD-10-CM

## 2024-08-16 LAB — BASIC METABOLIC PANEL WITH GFR
Anion gap: 16 — ABNORMAL HIGH (ref 5–15)
BUN: 39 mg/dL — ABNORMAL HIGH (ref 8–23)
CO2: 21 mmol/L — ABNORMAL LOW (ref 22–32)
Calcium: 8.7 mg/dL — ABNORMAL LOW (ref 8.9–10.3)
Chloride: 104 mmol/L (ref 98–111)
Creatinine, Ser: 2.96 mg/dL — ABNORMAL HIGH (ref 0.61–1.24)
GFR, Estimated: 20 mL/min — ABNORMAL LOW (ref >60)
Glucose, Bld: 156 mg/dL — ABNORMAL HIGH (ref 70–99)
Potassium: 3.4 mmol/L — ABNORMAL LOW (ref 3.5–5.1)
Sodium: 140 mmol/L (ref 135–145)

## 2024-08-16 LAB — ECHOCARDIOGRAM COMPLETE
AR max vel: 2.04 cm2
AV Area VTI: 2.63 cm2
AV Area mean vel: 1.67 cm2
AV Mean grad: 3 mmHg
AV Peak grad: 5.9 mmHg
Ao pk vel: 1.21 m/s
Area-P 1/2: 4.12 cm2
Height: 70 in
MV VTI: 4.05 cm2
S' Lateral: 2.2 cm
Single Plane A4C EF: 53.9 %
Weight: 2659.63 [oz_av]

## 2024-08-16 LAB — CBC
HCT: 34.9 % — ABNORMAL LOW (ref 39.0–52.0)
Hemoglobin: 11.6 g/dL — ABNORMAL LOW (ref 13.0–17.0)
MCH: 28 pg (ref 26.0–34.0)
MCHC: 33.2 g/dL (ref 30.0–36.0)
MCV: 84.3 fL (ref 80.0–100.0)
Platelets: 186 K/uL (ref 150–400)
RBC: 4.14 MIL/uL — ABNORMAL LOW (ref 4.22–5.81)
RDW: 16 % — ABNORMAL HIGH (ref 11.5–15.5)
WBC: 15 K/uL — ABNORMAL HIGH (ref 4.0–10.5)
nRBC: 0 % (ref 0.0–0.2)

## 2024-08-16 LAB — CBG MONITORING, ED
Glucose-Capillary: 143 mg/dL — ABNORMAL HIGH (ref 70–99)
Glucose-Capillary: 132 mg/dL — ABNORMAL HIGH (ref 70–99)

## 2024-08-16 LAB — APTT
aPTT: 96 s — ABNORMAL HIGH (ref 24–36)
aPTT: 67 s — ABNORMAL HIGH (ref 24–36)

## 2024-08-16 LAB — HEPARIN LEVEL (UNFRACTIONATED): Heparin Unfractionated: >1.1 [IU]/mL — ABNORMAL HIGH (ref 0.30–0.70)

## 2024-08-16 MED ORDER — ONDANSETRON HCL 4 MG/2ML IJ SOLN: 4.0000 mg | Freq: Four times a day (QID) | INTRAMUSCULAR | Status: DC | PRN

## 2024-08-16 MED ORDER — INFLUENZA VAC SPLIT HIGH-DOSE 0.5 ML IM SUSY
0.5000 mL | PREFILLED_SYRINGE | INTRAMUSCULAR | Status: AC
  Administered 2024-08-18: 0.5 mL via INTRAMUSCULAR
  Filled 2024-08-16: qty 0.5

## 2024-08-16 MED ORDER — LACTATED RINGERS IV SOLN: INTRAVENOUS | Status: DC

## 2024-08-16 MED ORDER — POTASSIUM CHLORIDE 10 MEQ/100ML IV SOLN
10.0000 meq | INTRAVENOUS | Status: DC
  Administered 2024-08-16 (×2): 10 meq via INTRAVENOUS
  Filled 2024-08-16 (×3): qty 100

## 2024-08-16 MED ORDER — BOOST / RESOURCE BREEZE PO LIQD CUSTOM
1.0000 | Freq: Three times a day (TID) | ORAL | Status: DC
  Administered 2024-08-16 – 2024-08-21 (×10): 1 via ORAL

## 2024-08-16 MED ORDER — HYDROMORPHONE HCL 1 MG/ML IJ SOLN: 0.5000 mg | INTRAMUSCULAR | Status: DC | PRN

## 2024-08-16 MED ORDER — METOPROLOL TARTRATE 5 MG/5ML IV SOLN
5.0000 mg | Freq: Once | INTRAVENOUS | Status: AC
  Administered 2024-08-16: 5 mg via INTRAVENOUS
  Filled 2024-08-16: qty 5

## 2024-08-16 MED ORDER — POTASSIUM CHLORIDE 10 MEQ/100ML IV SOLN
10.0000 meq | INTRAVENOUS | Status: AC
  Administered 2024-08-16 (×2): 10 meq via INTRAVENOUS
  Filled 2024-08-16 (×3): qty 100

## 2024-08-16 MED ORDER — PNEUMOCOCCAL 20-VAL CONJ VACC 0.5 ML IM SUSY
0.5000 mL | PREFILLED_SYRINGE | INTRAMUSCULAR | Status: AC
  Administered 2024-08-18: 0.5 mL via INTRAMUSCULAR
  Filled 2024-08-16: qty 0.5

## 2024-08-16 MED ORDER — PANTOPRAZOLE SODIUM 40 MG PO TBEC
40.0000 mg | DELAYED_RELEASE_TABLET | Freq: Every day | ORAL | Status: DC
  Administered 2024-08-16 – 2024-08-21 (×6): 40 mg via ORAL
  Filled 2024-08-16 (×6): qty 1

## 2024-08-16 NOTE — Assessment & Plan Note (Signed)
 Patient has been placed on amiodarone  for rate control Continue anticoagulation with IV heparin .  Continue telemetry monitoring.

## 2024-08-16 NOTE — Hospital Course (Signed)
 Mr. Giraud was admitted to the hospital with the working diagnosis of acute cholecystitis.   89 yo male with the past medical history of hypertension, hyperlipidemia, COPD and atrial fibrillation who presented with abdominal pain.  Reported intermittent abdominal pain for the last week prior to admission, symptoms started after Thanksgiving. On the day of admission he had worsening abdominal pain 7/10, prompting [facility] to call EMS. He was found in pain and in atrial fibrillation with rapid ventricular response. He has limited mobility confined to his home, uses a cane.  On his initial physical examination his blood pressure was 132/94, HR 130, RR 29 and 02 saturation 93%, Lungs with no wheezing or rhonchi, heart with S1 and S2 present, tachycardic, irregularly irregular, abdomen with tenderness to deep palpation at the lower quadrants, no lower extremity edema. 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   Na 142, K 4.1 Cl 106 bicarbonate 21, glucose 217 bun 34 cr 3,18  Mg 2,2  AST 16 and ALT 9, lipase < 10  BNP 4,348  High sensitive troponin 38 and 39  Lactic acid 3,6  Wbc 18,5 hgb 13,9 plt 214   Urine analysis SG 1,018, protein 30, trace leukocytes and moderate hgb, glucose > 500, 21-50 wbc, and 0-5 rbc   EKG 150 bpm, left axis deviation, left anterior fascicular block, qtc 466, atrial fibrillation rhythm with PVC, no significant ST segment changes, negative T wave lead I and aVL.   CT abdomen and pelvis with cholelithiasis with trace surrounding inflammatory stranding, findings consistent with acute cholecystitis.  Mild wall thickening and inflammation of the hepatic flexure, likely reactive.  Borderline dilatated loops of small bowel throughout the central abdomen without definite transition point. Findings may represent ileus.   Patient was placed on IV antibiotic therapy  IV amiodarone  for rate control atrial fibrillation.  Surgery has recommended conservative non operative  management.

## 2024-08-16 NOTE — Progress Notes (Signed)
 Progress Note   Patient: [name] [name] FMW:[id number] DOB: 12/14/1934 DOA: 08/15/2024     1 DOS: the patient was seen and examined on 08/16/2024   Brief hospital course: Mr. [name] [name] was admitted to the hospital with the working diagnosis of acute cholecystitis.   89 yo male with the past medical history of hypertension, hyperlipidemia, COPD and atrial fibrillation who presented with abdominal pain.  Reported intermittent abdominal pain for the last week prior to admission, symptoms started after Thanksgiving. On the day of admission he had worsening abdominal pain 7/10, prompting [facility] to call EMS. He was found in pain and in atrial fibrillation with rapid ventricular response. He has limited mobility confined to his home, uses a cane.  On his initial physical examination his blood pressure was 132/94, HR 130, RR 29 and 02 saturation 93%, Lungs with no wheezing or rhonchi, heart with S1 and S2 present, tachycardic, irregularly irregular, abdomen with tenderness to deep palpation at the lower quadrants, no lower extremity edema. 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   Na 142, K 4.1 Cl 106 bicarbonate 21, glucose 217 bun 34 cr 3,18  Mg 2,2  AST 16 and ALT 9, lipase < 10  BNP 4,348  High sensitive troponin 38 and 39  Lactic acid 3,6  Wbc 18,5 hgb 13,9 plt 214   Urine analysis SG 1,018, protein 30, trace leukocytes and moderate hgb, glucose > 500, 21-50 wbc, and 0-5 rbc   EKG 150 bpm, left axis deviation, left anterior fascicular block, qtc 466, atrial fibrillation rhythm with PVC, no significant ST segment changes, negative T wave lead I and aVL.   CT abdomen and pelvis with cholelithiasis with trace surrounding inflammatory stranding, findings consistent with acute cholecystitis.  Mild wall thickening and inflammation of the hepatic flexure, likely reactive.  Borderline dilatated loops of small bowel throughout the central abdomen without definite transition point. Findings  may represent ileus.   Patient was placed on IV antibiotic therapy  IV amiodarone  for rate control atrial fibrillation.  Surgery has recommended conservative non operative management.  Assessment and Plan: * Acute cholecystitis Positive severe sepsis (lactic acidosis) present on admission.  Follow up wbc trending down to 15.0 and he has been afebrile.   Plan to continue antibiotic therapy with Zosyn  Continue pain control with as needed hydromorphone and as needed antiemetics with zofran ,  Add antiacid therapy with pantoprazole.  Advance diet to clears Continue close monitoring.  If worsening symptoms may need cholecystostomy tube (may repeat imaging prior due to contracted gallbladder)   Per surgery recommendations no planned surgery.   Atrial fibrillation with RVR (HCC) Patient has been placed on amiodarone  for rate control Continue anticoagulation with IV heparin .  Continue telemetry monitoring.   Hypertension Continue blood pressure monitoring  Hold on antihypertensive medications due to risk of hypotension  At  home patient on clonidine, olmesartan and amlodipine   Chronic systolic CHF (congestive heart failure) (HCC) 2020 echocardiogram with reduced LV systolic function 25 to 30%, positive LVH, with global hypokinesis, moderate to severe tricuspid valve regurgitation.   Clinically with no signs of volume overload or decompensation Plan to continue amiodarone  for rate control atrial fibrillation.  Limited IV fluids, to 50 ml per hr  Follow up new echocardiogram  Acute kidney injury superimposed on stage 3b chronic kidney disease (HCC) AKI, Hypokalemia   Follow up renal function with serum cr at 2,96 with K at 3,4 and serum bicarbonate at 21 Na 140 Cl 109  Plan to continue IV fluids with balanced electrolyte solutions Add Kcl IV 40 meq  Follow up renal function and electrolytes  Avoid hypotension and nephrotoxic medications   COPD (chronic obstructive pulmonary  disease) (HCC) No signs of acute exacerbation  Continue bronchodilator therapy       Subjective: Patient continue to have abdominal pain, lower quadrant, mainly when moving, improved with rest, no nausea or vomiting, he has been cleared by surgery to take po   Physical Exam: Vitals:   08/16/24 1000 08/16/24 1030 08/16/24 1100 08/16/24 1130  BP: 120/72 127/79 (!) 153/90 (!) 152/88  Pulse:  73 (!) 146 (!) 110  Resp: (!) 24 19 (!) 29 (!) 33  Temp:      TempSrc:      SpO2:  100% 94% 96%  Weight:      Height:       Neurology awake and alert, deconditioned ENT with mild pallor with no icterus Cardiovascular with S1 and S2 present, irregularly irregular with no gallops, rubs or murmurs Respiratory with no rales or wheezing, poor inspiratory effort on anterior auscultation  Abdomen tender at the lower quadrants with no rebound or guarding,  No lower extremity edema.  Data Reviewed:    Family Communication: no family at the bedside   Disposition: Status is: Inpatient Remains inpatient appropriate because: IV antibiotics, IV amiodarone    Planned Discharge Destination: Home    Author: Elidia Toribio Furnace, MD 08/16/2024 12:18 PM  For on call review [email or web address].

## 2024-08-16 NOTE — Plan of Care (Signed)
   Problem: Fluid Volume: Goal: Hemodynamic stability will improve Outcome: Progressing   Problem: Clinical Measurements: Goal: Diagnostic test results will improve Outcome: Progressing Goal: Signs and symptoms of infection will decrease Outcome: Progressing   Problem: Respiratory: Goal: Ability to maintain adequate ventilation will improve Outcome: Progressing

## 2024-08-16 NOTE — Consult Note (Signed)
 [facility] SURGICAL CONSULTATION NOTE (initial) - cpt: 00756   HISTORY OF PRESENT ILLNESS (HPI):  89 y.o. male presented to [facility] ED yesterday for evaluation of abdominal pain. Per chart review and hand out brought from facility, patient's family has felt the patient has not eaten since around Thanksgiving. Patient reporting decreased appetite and generalized abdominal discomfort. No reports of fever, chills, nausea, emesis, CP, SOB, bowel changes. He does have a fairly extensive PMHx including COPD, atrial fibrillation on Eliquis , BM, CAD, HTN. No previous abdominal surgeries. Work up in the ED revealed a leukocytosis to 18.5K (now 15.0K), Hgb to 13.9, AKI with sCr - 3.18 (now 2.96), bilirubin 0.8, lipase <10, BNP 4348, venous lactate 3.6 (most recent 2.0). He did have CT Abdomen/Pelvis which has concerning for changes of possible cholecystitis. RUQ US  also noted cholelithiasis with wall thickening. He was admitted to the medicine service. He is currently on Zosyn .   Surgery is consulted by emergency medicine physician Dr. Concha Eagles, MD in this context for evaluation and management of acute cholecystitis.  PAST MEDICAL HISTORY (PMH):  Past Medical History:  Diagnosis Date   Arthritis    shoulder   Asthma    Atrial fibrillation (HCC)    Chronic combined systolic and diastolic CHF (congestive heart failure) (HCC)    Chronic kidney disease, stage IV (severe) (HCC)    COPD (chronic obstructive pulmonary disease) (HCC)    Coronary artery disease    Diabetes mellitus without complication (HCC)    type 2   Dysrhythmia    [facility] HEART IRREGULARLY IRREGULAR   Full dentures    upper and lower   Gout    History of anemia due to chronic kidney disease    Hypercholesteremia    Hypertension    Myocardial infarction (HCC)    Shortness of breath dyspnea    Swelling    FEET AND LEGS     PAST SURGICAL HISTORY (PSH):  Past Surgical History:  Procedure  Laterality Date   CARDIAC CATHETERIZATION     CATARACT EXTRACTION W/PHACO Right 01/28/2016   Procedure: CATARACT EXTRACTION PHACO AND INTRAOCULAR LENS PLACEMENT (IOC) right eye;  Surgeon: Dene Etienne, MD;  Location: [facility];  Service: Ophthalmology;  Laterality: Right;  DIABETIC-oral med MALYUGIN   CATARACT EXTRACTION W/PHACO Left 09/01/2023   Procedure: CATARACT EXTRACTION PHACO AND INTRAOCULAR LENS PLACEMENT (IOC) LEFT DIABETIC MALYUGIN VISION BLUE OMDIRIA 20.18 01:54.2;  Surgeon: Enola Feliciano Hugger, MD;  Location: [facility];  Service: Ophthalmology;  Laterality: Left;   RETINAL DETACHMENT SURGERY     RIGHT/LEFT HEART CATH AND CORONARY ANGIOGRAPHY N/A 09/03/2019   Procedure: RIGHT/LEFT HEART CATH AND CORONARY ANGIOGRAPHY;  Surgeon: Cherrie Toribio SAUNDERS, MD;  Location: [facility];  Service: Cardiovascular;  Laterality: N/A;     MEDICATIONS:  Prior to Admission medications   Medication Sig Start Date End Date Taking? Authorizing Provider  amlodipine-olmesartan (AZOR) 10-20 MG tablet Take 1 tablet by mouth daily.   Yes [provider]  BEN GAY ULTRA STRENGTH 12-21-28 % CREA Apply 1 Application topically 3 (three) times daily as needed (for muscle discomfort). 03/09/24  Yes [provider]  cloNIDine (CATAPRES - DOSED IN MG/24 HR) 0.1 mg/24hr patch Place 0.1 mg onto the skin once a week.   Yes [provider]  cyanocobalamin (VITAMIN B12) 1000 MCG tablet Take 1,000 mcg by mouth once a week. Fridays   Yes [provider]  ELIQUIS  2.5 MG TABS tablet Take 2.5 mg  by mouth 2 (two) times daily.   Yes [provider]  ferrous sulfate 325 (65 FE) MG tablet Take 325 mg by mouth 3 (three) times a week. CHRISTELLA ORN, F   Yes [provider]  JARDIANCE 10 MG TABS tablet Take 10 mg by mouth daily.   Yes [provider]  sodium bicarbonate  650 MG tablet Take 1,300 mg by mouth 2 (two) times daily. 08/13/24  Yes [provider]  vitamin D3 (CHOLECALCIFEROL) 25 MCG tablet Take 1,000 Units by mouth daily.   Yes [provider]  amLODipine (NORVASC) 10 MG tablet Take 10 mg by mouth daily. Patient not taking: Reported on 08/15/2024    [provider]  olmesartan (BENICAR) 20 MG tablet Take 20 mg by mouth daily. Patient not taking: Reported on 08/15/2024    [provider]  rivaroxaban (XARELTO) 10 MG TABS tablet Take 10 mg by mouth daily. Patient not taking: Reported on 08/15/2024    [provider]  white petrolatum (VASELINE) GEL Apply 1 Application topically as needed for dry skin. Patient not taking: Reported on 08/16/2024 03/20/24   [provider]     ALLERGIES:  No Known Allergies   SOCIAL HISTORY:  Social History   Socioeconomic History   Marital status: Single    Spouse name: Not on file   Number of children: Not on file   Years of education: Not on file   Highest education level: Not on file  Occupational History   Not on file  Tobacco Use   Smoking status: Former    Current packs/day: 0.25    Average packs/day: 0.3 packs/day for 50.0 years (12.5 ttl pk-yrs)    Types: Cigarettes   Smokeless tobacco: Current    Types: Chew  Substance and Sexual Activity   Alcohol use: No   Drug use: No   Sexual activity: Not on file  Other Topics Concern   Not on file  Social History Narrative   Not on file   Social Drivers of Health   Financial Resource Strain: Low Risk (04/13/2022)   [facility]   Overall Financial Resource Strain (CARDIA)    Difficulty of Paying Living Expenses: Not hard at all  Food Insecurity: No Food Insecurity (04/13/2022)   Received from [facility]   Hunger Vital Sign    Within the past 12 months, you worried that your food would run out before you got the money to buy more.: Never true    Within the past 12 months, the food you bought just didn't last and you didn't have money to get more.: Never true   Transportation Needs: No Transportation Needs (04/13/2022)   Received from [facility]   PRAPARE - Transportation    Lack of Transportation (Medical): No    Lack of Transportation (Non-Medical): No  Physical Activity: Not on file  Stress: Not on file  Social Connections: Not on file  Intimate Partner Violence: Not on file     FAMILY HISTORY:  Family History  Problem Relation Age of Onset   Stroke Mother       REVIEW OF SYSTEMS:  Review of Systems  Constitutional:  Negative for chills and fever.       + Decreased Appetite  Respiratory:  Negative for cough and shortness of breath.   Cardiovascular:  Negative for chest pain and palpitations.  Gastrointestinal:  Positive for abdominal pain. Negative for constipation, diarrhea, nausea and vomiting.  Genitourinary:  Negative for dysuria and urgency.  All other systems reviewed and are negative.   VITAL SIGNS:  Temp:  [98.1 F (36.7 C)-98.5 F (36.9 C)] 98.5 F (36.9 C) (12/04 0630) Pulse Rate:  [43-147] 126 (12/04 0630) Resp:  [22-33] 22 (12/04 0630) BP: (113-156)/(67-110) 116/78 (12/04 0630) SpO2:  [82 %-98 %] 95 % (12/04 0630) Weight:  [75.4 kg] 75.4 kg (12/03 1208)     Height: 5' 10 (177.8 cm) Weight: 75.4 kg BMI (Calculated): 23.85   INTAKE/OUTPUT:  12/03 0701 - 12/04 0700 In: 418.2 [I.V.:418.2] Out: 600 [Urine:600]  PHYSICAL EXAM:  Physical Exam Vitals and nursing note reviewed. Exam conducted with a chaperone present.  Constitutional:      General: He is not in acute distress.    Appearance: He is well-developed. He is not ill-appearing.     Comments: Resting in bed; arouses appropriately, NAD  HENT:     Head: Normocephalic and atraumatic.  Eyes:     General: No scleral icterus.    Extraocular Movements: Extraocular movements intact.  Cardiovascular:     Rate and Rhythm: Tachycardia present. Rhythm irregularly irregular.     Comments: Tachycardic to 120-130 bpm, atrial fibrillation  Pulmonary:      Effort: Pulmonary effort is normal. No respiratory distress.  Abdominal:     General: Abdomen is flat. There is no distension.     Palpations: Abdomen is soft.     Tenderness: There is abdominal tenderness (mild soreness) in the right upper quadrant. There is no guarding or rebound. Negative signs include Murphy's sign.     Comments: Abdomen is soft, he has very minimal RUQ soreness, non-distended, no rebound/guarding   Genitourinary:    Comments: Deferred Skin:    General: Skin is warm and dry.     Coloration: Skin is not jaundiced.  Neurological:     General: No focal deficit present.     Mental Status: He is alert and oriented to person, place, and time.      Labs:     Latest Ref Rng & Units 08/16/2024    5:06 AM 08/15/2024   12:20 PM 11/14/2019    4:32 PM  CBC  WBC 4.0 - 10.5 K/uL 15.0  18.5  9.7   Hemoglobin 13.0 - 17.0 g/dL 88.3  86.0  89.9   Hematocrit 39.0 - 52.0 % 34.9  42.4  31.8   Platelets 150 - 400 K/uL 186  214  339       Latest Ref Rng & Units 08/16/2024    5:06 AM 08/15/2024    1:53 PM 10/27/2020    1:25 PM  CMP  Glucose 70 - 99 mg/dL 843  782  883   BUN 8 - 23 mg/dL 39  34  8   Creatinine 0.61 - 1.24 mg/dL 7.03  6.81  8.52   Sodium 135 - 145 mmol/L 140  142  140   Potassium 3.5 - 5.1 mmol/L 3.4  4.1  2.8   Chloride 98 - 111 mmol/L 104  106  106   CO2 22 - 32 mmol/L 21  21  25    Calcium  8.9 - 10.3 mg/dL 8.7  8.3  8.7   Total Protein 6.5 - 8.1 g/dL  7.3  7.2   Total Bilirubin 0.0 - 1.2 mg/dL  0.8  0.6   Alkaline Phos 38 - 126 U/L  63  89   AST 15 - 41 U/L  16  18   ALT 0 - 44 U/L  9  10      Imaging studies:   CT Abdomen/Pelvis (08/15/2024) personally reviewed with noted cholelithiasis and inflammatory changes in the RUQ concerning for cholecystitis, and radiologist report reviewed below:  IMPRESSION: 1. Cholelithiasis with trace surrounding inflammatory stranding. Findings are concerning for acute cholecystitis. 2. Mild wall thickening and  inflammation of the hepatic flexure, likely reactive. 3. Borderline dilated loops of small bowel throughout the central abdomen without definitive transition point. Findings may represent ileus. 4. Aortic atherosclerosis.   RUQ US  (08/16/2024) personally reviewed with noted cholelithiasis, gallbladder wall thickening, and contraction fo the gallbladder, and radiologist report reviewed below:  IMPRESSION: Cholelithiasis, involving a poorly distended gallbladder with subsequent gallbladder wall thickening, without additional findings to suggest the presence of acute cholecystitis. Further evaluation with a nuclear medicine hepatobiliary scan is recommended if acute cholecystitis remains of clinical concern.   Assessment/Plan:  89 y.o. male with acute cholecystitis, complicated by pertinent comorbidities including advanced age, physical deconditioning, atrial fibrillation on anticoagulation, CAD, COPD.   - Appreciate medicine admission - Given his advanced age and significant comorbidities including need for anticoagulation, he is certainly at increased perioperative risk at this time. As such, would recommend continued conservative management with IV Abx. He has lesser abdominal pain this AM and leukocytosis is improving. If he fails to progress, we can consider percutaneous cholecystostomy tube placement; however, his gallbladder is quite contracted so will likely need repeat imaging prior.  - If he fails to continue to improve, we can consider HIDA as well - Okay for diet as tolerated; would start with CLD - Continue IV Abx    - Monitor abdominal examination   - Monitor leukocytosis   - Further management per primary service; we will follow   All of the above findings and recommendations were discussed with the patient, and all of patient's questions were answered to his expressed satisfaction.  Thank you for the opportunity to participate in this patient's care.   -- Arthea Platt, PA-C [facility] 08/16/2024, 7:22 AM M-F: 7am - 4pm

## 2024-08-16 NOTE — Assessment & Plan Note (Addendum)
 AKI, Hypokalemia   Follow up renal function with serum cr at 2,96 with K at 3,4 and serum bicarbonate at 21 Na 140 Cl 109   Plan to continue IV fluids with balanced electrolyte solutions Add Kcl IV 40 meq  Follow up renal function and electrolytes  Avoid hypotension and nephrotoxic medications

## 2024-08-16 NOTE — Assessment & Plan Note (Signed)
No signs of acute exacerbation. Continue bronchodilator therapy.

## 2024-08-16 NOTE — Assessment & Plan Note (Addendum)
 Continue blood pressure monitoring  Hold on antihypertensive medications due to risk of hypotension  At  home patient on clonidine, olmesartan and amlodipine

## 2024-08-16 NOTE — Plan of Care (Signed)

## 2024-08-16 NOTE — ED Notes (Signed)
 Pt given ice chips

## 2024-08-16 NOTE — Assessment & Plan Note (Addendum)
 Positive severe sepsis (lactic acidosis) present on admission.  Follow up wbc trending down to 15.0 and he has been afebrile.   Plan to continue antibiotic therapy with Zosyn  Continue pain control with as needed hydromorphone and as needed antiemetics with zofran ,  Add antiacid therapy with pantoprazole.  Advance diet to clears Continue close monitoring.  If worsening symptoms may need cholecystostomy tube (may repeat imaging prior due to contracted gallbladder)   Per surgery recommendations no planned surgery.

## 2024-08-16 NOTE — Progress Notes (Signed)
 PHARMACY - ANTICOAGULATION CONSULT NOTE  Pharmacy Consult for Heparin   Indication: atrial fibrillation  No Known Allergies  Patient Measurements: Height: 5' 10 (177.8 cm) Weight: 75.4 kg (166 lb 3.6 oz) IBW/kg (Calculated) : 73 HEPARIN  DW (KG): 75.4  Vital Signs: Temp: 98.2 F (36.8 C) (12/04 1501) Temp Source: Oral (12/04 1501) BP: 153/101 (12/04 1501) Pulse Rate: 99 (12/04 1501)  Labs: Recent Labs    08/15/24 1220 08/15/24 1353 08/15/24 1856 08/16/24 0506 08/16/24 1542  HGB 13.9  --   --  11.6*  --   HCT 42.4  --   --  34.9*  --   PLT 214  --   --  186  --   APTT  --   --  49* 96* 67*  LABPROT  --   --  18.9*  --   --   INR  --   --  1.5*  --   --   HEPARINUNFRC  --   --  >1.10* >1.10*  --   CREATININE  --  3.18*  --  2.96*  --     Estimated Creatinine Clearance: 18.2 mL/min (A) (by C-G formula based on SCr of 2.96 mg/dL (H)).   Medical History: Past Medical History:  Diagnosis Date   Arthritis    shoulder   Asthma    Atrial fibrillation (HCC)    Chronic combined systolic and diastolic CHF (congestive heart failure) (HCC)    Chronic kidney disease, stage IV (severe) (HCC)    COPD (chronic obstructive pulmonary disease) (HCC)    Coronary artery disease    Diabetes mellitus without complication (HCC)    type 2   Dysrhythmia    [facility] HEART IRREGULARLY IRREGULAR   Full dentures    upper and lower   Gout    History of anemia due to chronic kidney disease    Hypercholesteremia    Hypertension    Myocardial infarction (HCC)    Shortness of breath dyspnea    Swelling    FEET AND LEGS    Medications:  Medications Prior to Admission  Medication Sig Dispense Refill Last Dose/Taking   amlodipine-olmesartan (AZOR) 10-20 MG tablet Take 1 tablet by mouth daily.   08/15/2024   BEN GAY ULTRA STRENGTH 12-21-28 % CREA Apply 1 Application topically 3 (three) times daily as needed (for muscle discomfort).   Past Week   cloNIDine (CATAPRES - DOSED  IN MG/24 HR) 0.1 mg/24hr patch Place 0.1 mg onto the skin once a week.   Past Week   cyanocobalamin (VITAMIN B12) 1000 MCG tablet Take 1,000 mcg by mouth once a week. Fridays   08/15/2024   ELIQUIS  2.5 MG TABS tablet Take 2.5 mg by mouth 2 (two) times daily.   08/15/2024   ferrous sulfate 325 (65 FE) MG tablet Take 325 mg by mouth 3 (three) times a week. M, W, F   08/15/2024   JARDIANCE 10 MG TABS tablet Take 10 mg by mouth daily.   08/15/2024   sodium bicarbonate  650 MG tablet Take 1,300 mg by mouth 2 (two) times daily.   08/15/2024   vitamin D3 (CHOLECALCIFEROL) 25 MCG tablet Take 1,000 Units by mouth daily.   08/15/2024   white petrolatum (VASELINE) GEL Apply 1 Application topically as needed for dry skin.   Past Week   amLODipine (NORVASC) 10 MG tablet Take 10 mg by mouth daily. (Patient not taking: Reported on 08/15/2024)   Not Taking   olmesartan (BENICAR) 20 MG tablet  Take 20 mg by mouth daily. (Patient not taking: Reported on 08/15/2024)   Not Taking   rivaroxaban (XARELTO) 10 MG TABS tablet Take 10 mg by mouth daily. (Patient not taking: Reported on 08/15/2024)   Not Taking    Assessment: Pharmacy consulted to dose heparin  in this 89 year old male admitted with Afib.  Pt was on Eliquis  2.5 mg PO BID PTA,  last dose previous week at unspecified time.  CrCl = 16.9 ml/min   Goal of Therapy:  Heparin  level 0.3-0.7 units/ml aPTT 66 - 102 seconds Monitor platelets by anticoagulation protocol: Yes   12/3:   HL @ 1856 = > 1.10 (baseline)  12/4 @ 0506:  aPTT = 96,  HL = > 1.10 12/4 1542: aPTT 67, therapeutic x 2  Plan:  - Will continue with current Heparin  gtt rate at 1100 units/hr - aPTT therapeutic X 2, not correlating with HL  - Will continue to use aPTT to guide dosing until correlating with HL  - Will check aPTT and HL with AM labs  - Will continue to monitor CBC daily    Ransom Blanch PGY-1 Pharmacy Resident  [place] [facility]  08/16/2024 5:04 PM

## 2024-08-16 NOTE — Progress Notes (Signed)
 Pt with multiple gtt's ordered and IV meds. IV team requested for 3rd site d/t compatibility issues with medications.

## 2024-08-16 NOTE — Progress Notes (Signed)
 Patient's family in hallway at nurses station asking about patient. Family appeared lost and this RN attempted to find out what they needed. While attempting to answer their questions, however, family were upset stating staff had an attitude problem. This RN attempted to find out what the problem was, and was finally able to understand that the family had a question about the patient's HR. Educated that pt was still on medication for his HR and his HR was not responding very quickly to the IV amiodarone . Showed family member the monitor board and pointed out his HR on the screen. One of the patient's family members walked away at this point, loudly complaining about staff. This RN asked remaining family member if there were any other questions and they stated no. Provided bedside report to oncoming RN.

## 2024-08-16 NOTE — ED Notes (Signed)
 NURSE ASHLEY RN INFORMED OF ASSIGNED  BED

## 2024-08-16 NOTE — Progress Notes (Signed)
 PHARMACY - ANTICOAGULATION CONSULT NOTE  Pharmacy Consult for Heparin   Indication: atrial fibrillation  No Known Allergies  Patient Measurements: Height: 5' 10 (177.8 cm) Weight: 75.4 kg (166 lb 3.6 oz) IBW/kg (Calculated) : 73 HEPARIN  DW (KG): 75.4  Vital Signs: Temp: 98.5 F (36.9 C) (12/04 0630) Temp Source: Oral (12/04 0630) BP: 116/78 (12/04 0630) Pulse Rate: 126 (12/04 0630)  Labs: Recent Labs    08/15/24 1220 08/15/24 1353 08/15/24 1856 08/16/24 0506  HGB 13.9  --   --  11.6*  HCT 42.4  --   --  34.9*  PLT 214  --   --  186  APTT  --   --  49* 96*  LABPROT  --   --  18.9*  --   INR  --   --  1.5*  --   HEPARINUNFRC  --   --  >1.10* >1.10*  CREATININE  --  3.18*  --  2.96*    Estimated Creatinine Clearance: 18.2 mL/min (A) (by C-G formula based on SCr of 2.96 mg/dL (H)).   Medical History: Past Medical History:  Diagnosis Date   Arthritis    shoulder   Asthma    Atrial fibrillation (HCC)    Chronic combined systolic and diastolic CHF (congestive heart failure) (HCC)    Chronic kidney disease, stage IV (severe) (HCC)    COPD (chronic obstructive pulmonary disease) (HCC)    Coronary artery disease    Diabetes mellitus without complication (HCC)    type 2   Dysrhythmia    [facility] HEART IRREGULARLY IRREGULAR   Full dentures    upper and lower   Gout    History of anemia due to chronic kidney disease    Hypercholesteremia    Hypertension    Myocardial infarction (HCC)    Shortness of breath dyspnea    Swelling    FEET AND LEGS    Medications:  (Not in a hospital admission)   Assessment: Pharmacy consulted to dose heparin  in this 89 year old male admitted with Afib.  Pt was on Eliquis  2.5 mg PO BID PTA,  last dose previous week at unspecified time.  CrCl = 16.9 ml/min   Goal of Therapy:  Heparin  level 0.3-0.7 units/ml aPTT 66 - 102 seconds Monitor platelets by anticoagulation protocol: Yes   Plan:  Check baseline HL to  r/o continued effect of PTA Eliquis  12/3:   HL @ 1856 = > 1.10 (baseline) -  PTA Eliquis  still having effect so will use aPTT to guide dosing until correlating with HL   12/4 @ 0506:  aPTT = 96,  HL = > 1.10 - aPTT therapeutic X 1, not correlating with HL  - continue pt on current rate and recheck aPTT in 8 hrs - recheck HL on 12/5 with AM labs - use aPTT to guide dosing until correlating with HL  - CBC daily   Tawan Degroote D 08/16/2024,6:39 AM

## 2024-08-16 NOTE — Assessment & Plan Note (Addendum)
 2020 echocardiogram with reduced LV systolic function 25 to 30%, positive LVH, with global hypokinesis, moderate to severe tricuspid valve regurgitation.   Clinically with no signs of volume overload or decompensation Plan to continue amiodarone  for rate control atrial fibrillation.  Limited IV fluids, to 50 ml per hr  Follow up new echocardiogram

## 2024-08-17 DIAGNOSIS — K810 Acute cholecystitis: Secondary | ICD-10-CM | POA: Diagnosis not present

## 2024-08-17 LAB — CBC WITH DIFFERENTIAL/PLATELET
Abs Immature Granulocytes: 0.1 K/uL — ABNORMAL HIGH (ref 0.00–0.07)
Basophils Absolute: 0.1 K/uL (ref 0.0–0.1)
Basophils Relative: 0 %
Eosinophils Absolute: 0.6 K/uL — ABNORMAL HIGH (ref 0.0–0.5)
Eosinophils Relative: 5 %
HCT: 37.8 % — ABNORMAL LOW (ref 39.0–52.0)
Hemoglobin: 12.4 g/dL — ABNORMAL LOW (ref 13.0–17.0)
Immature Granulocytes: 1 %
Lymphocytes Relative: 9 %
Lymphs Abs: 1.1 K/uL (ref 0.7–4.0)
MCH: 27.9 pg (ref 26.0–34.0)
MCHC: 32.8 g/dL (ref 30.0–36.0)
MCV: 85.1 fL (ref 80.0–100.0)
Monocytes Absolute: 1.1 K/uL — ABNORMAL HIGH (ref 0.1–1.0)
Monocytes Relative: 9 %
Neutro Abs: 9.3 K/uL — ABNORMAL HIGH (ref 1.7–7.7)
Neutrophils Relative %: 76 %
Platelets: 191 K/uL (ref 150–400)
RBC: 4.44 MIL/uL (ref 4.22–5.81)
RDW: 15.9 % — ABNORMAL HIGH (ref 11.5–15.5)
WBC: 12.3 K/uL — ABNORMAL HIGH (ref 4.0–10.5)
nRBC: 0 % (ref 0.0–0.2)

## 2024-08-17 LAB — URINE CULTURE: Culture: >=100000 — AB

## 2024-08-17 LAB — COMPREHENSIVE METABOLIC PANEL WITH GFR
ALT: 12 U/L (ref 0–44)
AST: 27 U/L (ref 15–41)
Albumin: 3 g/dL — ABNORMAL LOW (ref 3.5–5.0)
Alkaline Phosphatase: 68 U/L (ref 38–126)
Anion gap: 15 (ref 5–15)
BUN: 34 mg/dL — ABNORMAL HIGH (ref 8–23)
CO2: 21 mmol/L — ABNORMAL LOW (ref 22–32)
Calcium: 8.5 mg/dL — ABNORMAL LOW (ref 8.9–10.3)
Chloride: 104 mmol/L (ref 98–111)
Creatinine, Ser: 2.51 mg/dL — ABNORMAL HIGH (ref 0.61–1.24)
GFR, Estimated: 24 mL/min — ABNORMAL LOW (ref >60)
Glucose, Bld: 145 mg/dL — ABNORMAL HIGH (ref 70–99)
Potassium: 4.1 mmol/L (ref 3.5–5.1)
Sodium: 139 mmol/L (ref 135–145)
Total Bilirubin: 0.4 mg/dL (ref 0.0–1.2)
Total Protein: 6.8 g/dL (ref 6.5–8.1)

## 2024-08-17 LAB — MAGNESIUM: Magnesium: 2.3 mg/dL (ref 1.7–2.4)

## 2024-08-17 LAB — GLUCOSE, CAPILLARY
Glucose-Capillary: 157 mg/dL — ABNORMAL HIGH (ref 70–99)
Glucose-Capillary: 172 mg/dL — ABNORMAL HIGH (ref 70–99)

## 2024-08-17 LAB — APTT: aPTT: 72 s — ABNORMAL HIGH (ref 24–36)

## 2024-08-17 LAB — HEPARIN LEVEL (UNFRACTIONATED): Heparin Unfractionated: >1.1 [IU]/mL — ABNORMAL HIGH (ref 0.30–0.70)

## 2024-08-17 MED ORDER — PIPERACILLIN-TAZOBACTAM 3.375 G IVPB
3.3750 g | Freq: Three times a day (TID) | INTRAVENOUS | Status: DC
  Administered 2024-08-17 – 2024-08-20 (×9): 3.375 g via INTRAVENOUS
  Filled 2024-08-17 (×9): qty 50

## 2024-08-17 MED ORDER — METOPROLOL TARTRATE 25 MG PO TABS
12.5000 mg | ORAL_TABLET | Freq: Two times a day (BID) | ORAL | Status: DC
  Administered 2024-08-17 – 2024-08-18 (×3): 12.5 mg via ORAL
  Filled 2024-08-17 (×3): qty 1

## 2024-08-17 NOTE — Plan of Care (Signed)
   Problem: Fluid Volume: Goal: Hemodynamic stability will improve Outcome: Progressing   Problem: Clinical Measurements: Goal: Diagnostic test results will improve Outcome: Progressing Goal: Signs and symptoms of infection will decrease Outcome: Progressing   Problem: Respiratory: Goal: Ability to maintain adequate ventilation will improve Outcome: Progressing

## 2024-08-17 NOTE — TOC Initial Note (Signed)
 Transition of Care [facility]) - Initial/Assessment Note    Patient Details  Name: [name] [name] MRN: [id number] Date of Birth: 1934/12/08  Transition of [facility]) CM/SW Contact:    Alfonso Rummer, LCSW Phone Number: 08/17/2024, 3:00 PM  Clinical Narrative:                  [name] [name] Rummer completed TOC visit with [name] [name] at bedside in room 239a. Pt lives alone and engaged with [facility]. Pt case mgr with [name] [name] is [name] [name] [phone] ext [phone]. [facility] will arrange for pt receive pt, ot and home health services once medically discharge. Pt have adequate family support niece [name] [name] [phone] and [name] [name] [phone].        Patient Goals and CMS Choice            Expected Discharge Plan and Services                                              Prior Living Arrangements/Services                       Activities of Daily Living   ADL Screening (condition at time of admission) Independently performs ADLs?: Yes (appropriate for developmental age) Is the patient deaf or have difficulty hearing?: No Does the patient have difficulty seeing, even when wearing glasses/contacts?: No Does the patient have difficulty concentrating, remembering, or making decisions?: No  Permission Sought/Granted                  Emotional Assessment              Admission diagnosis:  Acute cholecystitis [K81.0] Generalized abdominal pain [R10.84] Urinary tract infection without hematuria, site unspecified [N39.0] Patient Active Problem List   Diagnosis Date Noted   Chronic systolic CHF (congestive heart failure) (HCC) 08/15/2024   Acute cholecystitis 08/15/2024   Atrial fibrillation with RVR (HCC) 08/15/2024   Hypoglycemia 10/05/2019   Unresponsiveness 10/05/2019   Atrial fibrillation, chronic (HCC) 10/05/2019   Chronic combined systolic (congestive) and diastolic (congestive) heart failure (HCC) 10/05/2019   Hypertension     Hypercholesteremia    COPD (chronic obstructive pulmonary disease) (HCC)    CKD (chronic kidney disease), stage IIIa    Elevated troponin    Malnutrition of moderate degree 08/30/2019   Cardiogenic shock (HCC) 08/29/2019   Acute systolic heart failure (HCC) 08/29/2019   Acute kidney injury superimposed on stage 3b chronic kidney disease (HCC) 08/29/2019   Atrial flutter (HCC) 08/29/2019   Acute respiratory failure with hypoxia (HCC)    Acute on chronic combined systolic and diastolic congestive heart failure (HCC)    PCP:  Joyice Kern, PA-C (Inactive) Pharmacy:   [facility] - [place], [place] - [address] [place] [address] Phone: [phone] Fax: [phone]  [facility] [address], [place] - [address] [place] [address] Phone: [phone] Fax: [phone]  [facility] [place] [employer], [place] - [address] [place] [address] Phone: [phone] Fax: [phone]     Social Drivers of Health (SDOH) Social History: SDOH Screenings   Food Insecurity: No Food Insecurity (08/16/2024)  Housing: Low Risk  (08/16/2024)  Transportation Needs: No Transportation Needs (08/16/2024)  Utilities: Not At Risk (08/16/2024)  Financial Resource Strain: Low Risk (04/13/2022)   Received from [facility]  Social Connections: Moderately Integrated (08/16/2024)  Tobacco Use: High Risk (08/15/2024)   SDOH Interventions:     Readmission Risk Interventions     No data to display

## 2024-08-17 NOTE — Progress Notes (Signed)
 PHARMACY - ANTICOAGULATION CONSULT NOTE  Pharmacy Consult for Heparin   Indication: atrial fibrillation  No Known Allergies  Patient Measurements: Height: 5' 10 (177.8 cm) Weight: 95.1 kg (209 lb 10.5 oz) IBW/kg (Calculated) : 73 HEPARIN  DW (KG): 75.4  Vital Signs: Temp: 99.6 F (37.6 C) (12/05 0402) BP: 122/85 (12/05 0402) Pulse Rate: 108 (12/05 0402)  Labs: Recent Labs    08/15/24 1220 08/15/24 1220 08/15/24 1353 08/15/24 1856 08/16/24 0506 08/16/24 1542 08/17/24 0435  HGB 13.9  --   --   --  11.6*  --  12.4*  HCT 42.4  --   --   --  34.9*  --  37.8*  PLT 214  --   --   --  186  --  191  APTT  --    < >  --  49* 96* 67* 72*  LABPROT  --   --   --  18.9*  --   --   --   INR  --   --   --  1.5*  --   --   --   HEPARINUNFRC  --   --   --  >1.10* >1.10*  --  >1.10*  CREATININE  --   --  3.18*  --  2.96*  --  2.51*   < > = values in this interval not displayed.    Estimated Creatinine Clearance: 24 mL/min (A) (by C-G formula based on SCr of 2.51 mg/dL (H)).   Medical History: Past Medical History:  Diagnosis Date   Arthritis    shoulder   Asthma    Atrial fibrillation (HCC)    Chronic combined systolic and diastolic CHF (congestive heart failure) (HCC)    Chronic kidney disease, stage IV (severe) (HCC)    COPD (chronic obstructive pulmonary disease) (HCC)    Coronary artery disease    Diabetes mellitus without complication (HCC)    type 2   Dysrhythmia    PER [facility]ICE HEART IRREGULARLY IRREGULAR   Full dentures    upper and lower   Gout    History of anemia due to chronic kidney disease    Hypercholesteremia    Hypertension    Myocardial infarction (HCC)    Shortness of breath dyspnea    Swelling    FEET AND LEGS    Medications:  Medications Prior to Admission  Medication Sig Dispense Refill Last Dose/Taking   amlodipine-olmesartan (AZOR) 10-20 MG tablet Take 1 tablet by mouth daily.   08/15/2024   BEN GAY ULTRA STRENGTH 12-21-28 % CREA  Apply 1 Application topically 3 (three) times daily as needed (for muscle discomfort).   Past Week   cloNIDine (CATAPRES - DOSED IN MG/24 HR) 0.1 mg/24hr patch Place 0.1 mg onto the skin once a week.   Past Week   cyanocobalamin (VITAMIN B12) 1000 MCG tablet Take 1,000 mcg by mouth once a week. Fridays   08/15/2024   ELIQUIS  2.5 MG TABS tablet Take 2.5 mg by mouth 2 (two) times daily.   08/15/2024   ferrous sulfate 325 (65 FE) MG tablet Take 325 mg by mouth 3 (three) times a week. M, W, F   08/15/2024   JARDIANCE 10 MG TABS tablet Take 10 mg by mouth daily.   08/15/2024   sodium bicarbonate  650 MG tablet Take 1,300 mg by mouth 2 (two) times daily.   08/15/2024   vitamin D3 (CHOLECALCIFEROL) 25 MCG tablet Take 1,000 Units by mouth daily.   08/15/2024  white petrolatum (VASELINE) GEL Apply 1 Application topically as needed for dry skin.   Past Week   amLODipine (NORVASC) 10 MG tablet Take 10 mg by mouth daily. (Patient not taking: Reported on 08/15/2024)   Not Taking   olmesartan (BENICAR) 20 MG tablet Take 20 mg by mouth daily. (Patient not taking: Reported on 08/15/2024)   Not Taking   rivaroxaban (XARELTO) 10 MG TABS tablet Take 10 mg by mouth daily. (Patient not taking: Reported on 08/15/2024)   Not Taking    Assessment: Pharmacy consulted to dose heparin  in this 89 year old male admitted with Afib.  Pt was on Eliquis  2.5 mg PO BID PTA,  last dose previous week at unspecified time.  CrCl = 16.9 ml/min   Goal of Therapy:  Heparin  level 0.3-0.7 units/ml aPTT 66 - 102 seconds Monitor platelets by anticoagulation protocol: Yes   12/3:   HL @ 1856 = > 1.10 (baseline)  12/4 @ 0506:  aPTT = 96,  HL = > 1.10 12/4 1542: aPTT 67, therapeutic x 2 12/5 0435: aPTT 72, HL = > 1.10   Plan:  - Will continue with current Heparin  gtt rate at 1100 units/hr - aPTT therapeutic X 3, not correlating with HL  - Will continue to use aPTT to guide dosing until correlating with HL  - Will check aPTT and HL on 12/6  with AM labs  - Will continue to monitor CBC daily   Silver Selinda JONETTA [facility]  08/17/2024 5:45 AM

## 2024-08-17 NOTE — Progress Notes (Signed)
  PROGRESS NOTE    [name] [name]  FMW:[id number] DOB: July 07, 1935 DOA: 08/15/2024 PCP: Joyice Kern, PA-C (Inactive)  [id number]/[id number]  LOS: 2 days   Brief hospital course:   Assessment & Plan: [name] [name] was admitted to the hospital with the working diagnosis of acute cholecystitis.    89 yo male with the past medical history of hypertension, hyperlipidemia, COPD and atrial fibrillation who presented with abdominal pain.  Reported intermittent abdominal pain for the last week prior to admission, symptoms started after Thanksgiving. On the day of admission he had worsening abdominal pain 7/10, prompting [facility] to call EMS. He was found in pain and in atrial fibrillation with rapid ventricular response. He has limited mobility confined to his home, uses a cane.    Severe sepsis 2/2 * Acute cholecystitis Per surgery no planned surgery.  --cont zosyn    Atrial fibrillation with RVR (HCC) --started on IV amio due to hx of reduced EF --cont IV amio gtt --add lopressor  --cont heparin  gtt for now   Hypertension --hold home clonidine, olmesartan and amlodipine   Chronic systolic CHF (congestive heart failure) (HCC) 2020 echocardiogram with reduced LV systolic function 25 to 30%, positive LVH, with global hypokinesis, moderate to severe tricuspid valve regurgitation.  --Clinically with no signs of volume overload or decompensation --add lopressor   Acute kidney injury superimposed on stage 3b chronic kidney disease (HCC) --improved with IVF --d/c MIVF today   Hypokalemia --monitor and supplement PRN  COPD (chronic obstructive pulmonary disease) (HCC) No signs of acute exacerbation     DVT prophylaxis: On:heparin  gtt Code Status: Full code  Family Communication:  Level of care: Progressive Dispo:   The patient is from: home Anticipated d/c is to: home Anticipated d/c date is: 2-3 days   Subjective and Interval History:  Pt reported soreness across his upper abdomen.  No  appetite.   Objective: Vitals:   08/17/24 0500 08/17/24 0804 08/17/24 1215 08/17/24 1522  BP:  136/76 129/86 (!) 145/91  Pulse:  (!) 102 (!) 113 97  Resp:  20 20 20   Temp:  98.7 F (37.1 C) 98.4 F (36.9 C) 98 F (36.7 C)  TempSrc:  Oral Oral   SpO2:  97% 96%   Weight: 95.1 kg     Height:        Intake/Output Summary (Last 24 hours) at 08/17/2024 1938 Last data filed at 08/17/2024 1905 Gross per 24 hour  Intake 2468.87 ml  Output 1450 ml  Net 1018.87 ml   Filed Weights   08/15/24 1208 08/17/24 0500  Weight: 75.4 kg 95.1 kg    Examination:   Constitutional: NAD, AAOx3 HEENT: conjunctivae and lids normal, EOMI CV: No cyanosis.   RESP: normal respiratory effort, on RA Neuro: II - XII grossly intact.   Psych: Normal mood and affect.  Appropriate judgement and reason   Data Reviewed: I have personally reviewed labs and imaging studies  Time spent: 50 minutes  Ellouise Haber, MD [facility] If 7PM-7AM, please contact night-coverage 08/17/2024, 7:38 PM

## 2024-08-17 NOTE — Progress Notes (Signed)
   08/17/24 1215  Assess: MEWS Score  Temp 98.4 F (36.9 C)  BP 129/86  MAP (mmHg) 98  Pulse Rate (!) 113  ECG Heart Rate (!) 113  Resp 20  SpO2 96 %  O2 Device Room Air  Assess: MEWS Score  MEWS Temp 0  MEWS Systolic 0  MEWS Pulse 2  MEWS RR 0  MEWS LOC 0  MEWS Score 2  MEWS Score Color Yellow  Assess: if the MEWS score is Yellow or Red  Were vital signs accurate and taken at a resting state? Yes  Does the patient meet 2 or more of the SIRS criteria? Yes  Does the patient have a confirmed or suspected source of infection? No  MEWS guidelines implemented  Yes, yellow  Treat  MEWS Interventions Considered administering scheduled or prn medications/treatments as ordered  Take Vital Signs  Increase Vital Sign Frequency  Yellow: Q2hr x1, continue Q4hrs until patient remains green for 12hrs  Escalate  MEWS: Escalate Yellow: Discuss with charge nurse and consider notifying provider and/or RRT  Notify: Charge Nurse/RN  Name of Charge Nurse/RN Notified Sarah,RN  Provider Notification  Provider Name/Title Ellouise Haber, MD  Date Provider Notified 08/17/24  Time Provider Notified 1218  Method of Notification Page  Notification Reason Other (Comment) (patient VS his pulse turned him YELLOW due to a. fib rhythm at 113)  Assess: SIRS CRITERIA  SIRS Temperature  0  SIRS Respirations  0  SIRS Pulse 1  SIRS WBC 1  SIRS Score Sum  2

## 2024-08-17 NOTE — Progress Notes (Signed)
 [facility] SURGICAL PROGRESS NOTE (cpt [id number])  Hospital Day(s): 2.   Interval History: Patient seen and examined, no acute events or new complaints overnight. Patient reports he is doing well. Sleeping comfortably and arouses appropriately. He denied any abdominal pain this morning. No fever, chills, nausea, emesis. His leukocytosis is improving; WBC 12.3K. Hgb to 12.4. AKI improving; sCr - 2.51; UO -1450 ccs. No electrolyte derangements. He is on Zosyn . CLD: no issues.   Review of Systems:  Constitutional: denies fever, chills  HEENT: denies cough or congestion  Respiratory: denies any shortness of breath  Cardiovascular: denies chest pain or palpitations  Gastrointestinal: denies abdominal pain, N/V Genitourinary: denies burning with urination or urinary frequency Musculoskeletal: denies pain, decreased motor or sensation  Vital signs in last 24 hours: [min-max] current  Temp:  [97.8 F (36.6 C)-99.9 F (37.7 C)] 98.7 F (37.1 C) (12/05 0804) Pulse Rate:  [67-146] 102 (12/05 0804) Resp:  [19-33] 20 (12/05 0804) BP: (116-162)/(72-121) 136/76 (12/05 0804) SpO2:  [94 %-100 %] 97 % (12/05 0804) Weight:  [95.1 kg] 95.1 kg (12/05 0500)     Height: 5' 10 (177.8 cm) Weight: 95.1 kg BMI (Calculated): 30.08   Intake/Output last 2 shifts:  12/04 0701 - 12/05 0700 In: 2514.7 [P.O.:520; I.V.:1502.8; IV Piggyback:491.9] Out: 1450 [Urine:1450]   Physical Exam:  Constitutional: alert, cooperative and no distress  HENT: normocephalic without obvious abnormality  Eyes: PERRL, EOM's grossly intact and symmetric  Respiratory: breathing non-labored at rest  Cardiovascular: regular rate and sinus rhythm  Gastrointestinal: soft, non-tender, and non-distended. Negative Murphy's Sign. No rebound/guarding   Labs:     Latest Ref Rng & Units 08/17/2024    4:35 AM 08/16/2024    5:06 AM 08/15/2024   12:20 PM  CBC  WBC 4.0 - 10.5 K/uL 12.3  15.0  18.5   Hemoglobin 13.0 - 17.0 g/dL  87.5  88.3  86.0   Hematocrit 39.0 - 52.0 % 37.8  34.9  42.4   Platelets 150 - 400 K/uL 191  186  214       Latest Ref Rng & Units 08/17/2024    4:35 AM 08/16/2024    5:06 AM 08/15/2024    1:53 PM  CMP  Glucose 70 - 99 mg/dL 854  843  782   BUN 8 - 23 mg/dL 34  39  34   Creatinine 0.61 - 1.24 mg/dL 7.48  7.03  6.81   Sodium 135 - 145 mmol/L 139  140  142   Potassium 3.5 - 5.1 mmol/L 4.1  3.4  4.1   Chloride 98 - 111 mmol/L 104  104  106   CO2 22 - 32 mmol/L 21  21  21    Calcium  8.9 - 10.3 mg/dL 8.5  8.7  8.3   Total Protein 6.5 - 8.1 g/dL 6.8   7.3   Total Bilirubin 0.0 - 1.2 mg/dL 0.4   0.8   Alkaline Phos 38 - 126 U/L 68   63   AST 15 - 41 U/L 27   16   ALT 0 - 44 U/L 12   9      Imaging studies: No new pertinent imaging studies   Assessment/Plan:  89 y.o. male with improving cute cholecystitis, complicated by pertinent comorbidities including advanced age, physical deconditioning, atrial fibrillation on anticoagulation, CAD, COPD.               - Okay to advance diet as tolerated; Recommend low fat diet  -  Again, given his advanced age and significant comorbidities including need for anticoagulation, he is certainly at increased perioperative risk at this time. As such, would recommend continued conservative management with IV Abx. His abdominal pain continues to improve and leukocytosis is improving. If he fails to progress, we can consider percutaneous cholecystostomy tube placement; however, his gallbladder is quite contracted so will likely need repeat imaging prior.  - If he fails to continue to improve, we can consider HIDA as well - Continue IV Abx (Zosyn ): Would recommend 10 days total (IV + PO); Okay to do Augmentin             - Monitor abdominal examination   - Monitor renal function; improving             - Monitor leukocytosis; improving             - Further management per primary service   - Discharge Planning: From a surgical perspective, he continues to  improve and leukocytosis is improving as well. I do think we can advance diet as tolerated and monitor progression. He would benefit from 10 days total of Abx at discharge (IV+PO).   All of the above findings and recommendations were discussed with the patient, and the medical team, and all of patient's questions were answered to his expressed satisfaction.  -- Arthea Platt, PA-C [facility] 08/17/2024, 8:10 AM M-F: 7am - 4pm

## 2024-08-18 DIAGNOSIS — K810 Acute cholecystitis: Secondary | ICD-10-CM | POA: Diagnosis not present

## 2024-08-18 LAB — CBC
HCT: 35.7 % — ABNORMAL LOW (ref 39.0–52.0)
Hemoglobin: 12.1 g/dL — ABNORMAL LOW (ref 13.0–17.0)
MCH: 28 pg (ref 26.0–34.0)
MCHC: 33.9 g/dL (ref 30.0–36.0)
MCV: 82.6 fL (ref 80.0–100.0)
Platelets: 185 K/uL (ref 150–400)
RBC: 4.32 MIL/uL (ref 4.22–5.81)
RDW: 16.2 % — ABNORMAL HIGH (ref 11.5–15.5)
WBC: 12.8 K/uL — ABNORMAL HIGH (ref 4.0–10.5)
nRBC: 0 % (ref 0.0–0.2)

## 2024-08-18 LAB — GLUCOSE, CAPILLARY
Glucose-Capillary: 135 mg/dL — ABNORMAL HIGH (ref 70–99)
Glucose-Capillary: 139 mg/dL — ABNORMAL HIGH (ref 70–99)
Glucose-Capillary: 143 mg/dL — ABNORMAL HIGH (ref 70–99)
Glucose-Capillary: 175 mg/dL — ABNORMAL HIGH (ref 70–99)

## 2024-08-18 LAB — BASIC METABOLIC PANEL WITH GFR
Anion gap: 14 (ref 5–15)
BUN: 27 mg/dL — ABNORMAL HIGH (ref 8–23)
CO2: 19 mmol/L — ABNORMAL LOW (ref 22–32)
Calcium: 8.4 mg/dL — ABNORMAL LOW (ref 8.9–10.3)
Chloride: 103 mmol/L (ref 98–111)
Creatinine, Ser: 2.15 mg/dL — ABNORMAL HIGH (ref 0.61–1.24)
GFR, Estimated: 29 mL/min — ABNORMAL LOW (ref >60)
Glucose, Bld: 135 mg/dL — ABNORMAL HIGH (ref 70–99)
Potassium: 3.8 mmol/L (ref 3.5–5.1)
Sodium: 136 mmol/L (ref 135–145)

## 2024-08-18 LAB — APTT: aPTT: 101 s — ABNORMAL HIGH (ref 24–36)

## 2024-08-18 LAB — HEPARIN LEVEL (UNFRACTIONATED): Heparin Unfractionated: >1.1 [IU]/mL — ABNORMAL HIGH (ref 0.30–0.70)

## 2024-08-18 MED ORDER — METOPROLOL TARTRATE 25 MG PO TABS: 25.0000 mg | ORAL_TABLET | Freq: Two times a day (BID) | ORAL | Status: DC

## 2024-08-18 MED ORDER — APIXABAN 2.5 MG PO TABS
2.5000 mg | ORAL_TABLET | Freq: Two times a day (BID) | ORAL | Status: DC
  Administered 2024-08-18 – 2024-08-21 (×7): 2.5 mg via ORAL
  Filled 2024-08-18 (×7): qty 1

## 2024-08-18 MED ORDER — METOPROLOL TARTRATE 25 MG PO TABS
25.0000 mg | ORAL_TABLET | Freq: Two times a day (BID) | ORAL | Status: DC
  Administered 2024-08-18 – 2024-08-19 (×3): 25 mg via ORAL
  Filled 2024-08-18 (×3): qty 1

## 2024-08-18 NOTE — Progress Notes (Signed)
 PHARMACY - ANTICOAGULATION CONSULT NOTE  Pharmacy Consult for Heparin   Indication: atrial fibrillation  No Known Allergies  Patient Measurements: Height: 5' 10 (177.8 cm) Weight: 74 kg (163 lb 2.3 oz) IBW/kg (Calculated) : 73 HEPARIN  DW (KG): 75.4  Vital Signs: Temp: 98.3 F (36.8 C) (12/06 0509) BP: 148/84 (12/06 0509) Pulse Rate: 106 (12/06 0509)  Labs: Recent Labs    08/15/24 1856 08/16/24 0506 08/16/24 1542 08/17/24 0435 08/18/24 0450 08/18/24 0731  HGB  --  11.6*  --  12.4* 12.1*  --   HCT  --  34.9*  --  37.8* 35.7*  --   PLT  --  186  --  191 185  --   APTT 49* 96* 67* 72*  --  101*  LABPROT 18.9*  --   --   --   --   --   INR 1.5*  --   --   --   --   --   HEPARINUNFRC >1.10* >1.10*  --  >1.10*  --  >1.10*  CREATININE  --  2.96*  --  2.51* 2.15*  --     Estimated Creatinine Clearance: 25 mL/min (A) (by C-G formula based on SCr of 2.15 mg/dL (H)).   Medical History: Past Medical History:  Diagnosis Date   Arthritis    shoulder   Asthma    Atrial fibrillation (HCC)    Chronic combined systolic and diastolic CHF (congestive heart failure) (HCC)    Chronic kidney disease, stage IV (severe) (HCC)    COPD (chronic obstructive pulmonary disease) (HCC)    Coronary artery disease    Diabetes mellitus without complication (HCC)    type 2   Dysrhythmia    PER [facility] HEART IRREGULARLY IRREGULAR   Full dentures    upper and lower   Gout    History of anemia due to chronic kidney disease    Hypercholesteremia    Hypertension    Myocardial infarction (HCC)    Shortness of breath dyspnea    Swelling    FEET AND LEGS    Medications:  Medications Prior to Admission  Medication Sig Dispense Refill Last Dose/Taking   amlodipine-olmesartan (AZOR) 10-20 MG tablet Take 1 tablet by mouth daily.   08/15/2024   BEN GAY ULTRA STRENGTH 12-21-28 % CREA Apply 1 Application topically 3 (three) times daily as needed (for muscle discomfort).   Past Week    cloNIDine (CATAPRES - DOSED IN MG/24 HR) 0.1 mg/24hr patch Place 0.1 mg onto the skin once a week.   Past Week   cyanocobalamin (VITAMIN B12) 1000 MCG tablet Take 1,000 mcg by mouth once a week. Fridays   08/15/2024   ELIQUIS  2.5 MG TABS tablet Take 2.5 mg by mouth 2 (two) times daily.   08/15/2024   ferrous sulfate 325 (65 FE) MG tablet Take 325 mg by mouth 3 (three) times a week. M, W, F   08/15/2024   JARDIANCE 10 MG TABS tablet Take 10 mg by mouth daily.   08/15/2024   sodium bicarbonate  650 MG tablet Take 1,300 mg by mouth 2 (two) times daily.   08/15/2024   vitamin D3 (CHOLECALCIFEROL) 25 MCG tablet Take 1,000 Units by mouth daily.   08/15/2024   white petrolatum (VASELINE) GEL Apply 1 Application topically as needed for dry skin.   Past Week   amLODipine (NORVASC) 10 MG tablet Take 10 mg by mouth daily. (Patient not taking: Reported on 08/15/2024)   Not Taking  Assessment: Pharmacy consulted to dose heparin  in this 89 year old male admitted with Afib.  Pt was on Eliquis  2.5 mg PO BID PTA,  last dose previous week at unspecified time.  CrCl = 16.9 ml/min   Goal of Therapy:  Heparin  level 0.3-0.7 units/ml aPTT 66 - 102 seconds Monitor platelets by anticoagulation protocol: Yes   12/3:   HL @ 1856 = > 1.10 (baseline)  12/4 @ 0506:  aPTT = 96,  HL = > 1.10 12/4 1542: aPTT 67, therapeutic x 2- 1100 units/hr 12/5 0435: aPTT 72, HL = > 1.10  12/6 0731: aPTT 101, HL = >1.10  Plan:  - aPTT high end of normal this morning. Will continue heparin  infusion rate of 1100 units/hr -Will recheck aPTT in 8 hours due to aPTT increase from 72 to 101  - aPTT and HL not correlating   - Will continue to use aPTT to guide dosing until correlating with HL  - Will check aPTT and HL on 12/6 with AM labs  - Will continue to monitor CBC daily   Estill CHRISTELLA Lutes, PharmD, BCPS Clinical Pharmacist 08/18/2024 8:12 AM

## 2024-08-18 NOTE — Evaluation (Signed)
 Physical Therapy Evaluation Patient Details Name: [name] [name] MRN: [id number] DOB: 06-12-1935 Today's Date: 08/18/2024  History of Present Illness  Mr. Mells was admitted to the hospital with the working diagnosis of acute cholecystitis.      89 yo male with the past medical history of hypertension, hyperlipidemia, COPD and atrial fibrillation who presented with abdominal pain.   Reported intermittent abdominal pain for the last week prior to admission, symptoms started after Thanksgiving. On the day of admission he had worsening abdominal pain 7/10, prompting [facility] to call EMS. He was found in pain and in atrial fibrillation with rapid ventricular response. He has limited mobility confined to his home, uses a cane.  Clinical Impression  Patient noted to be in suping position at PT arrival in room, for an initial PT evaluation due to a decline in functional status, with baseline mobility reported as independent, and currently requiring CGA/minA for ambulation. The patient is A&O x 4, presenting with good willingness to work with PT and goals of going home. The patient resides in a mobile home and lives alone with family/friend support. There are no steps and inside the residence. Gait was assessed with SPC ambulated 220', limited by occasional bowel incontinence. Recommended skilled PT will address safety, mobility, and discharge planning. PT recommendation to d/c patient to HHPT upon medical clearance.         If plan is discharge home, recommend the following: A little help with walking and/or transfers;A little help with bathing/dressing/bathroom;Help with stairs or ramp for entrance;Assist for transportation   Can travel by private vehicle        Equipment Recommendations Rolling walker (2 wheels)  Recommendations for Other Services       Functional Status Assessment Patient has had a recent decline in their functional status and demonstrates the ability to make significant  improvements in function in a reasonable and predictable amount of time.     Precautions / Restrictions Restrictions Weight Bearing Restrictions Per Provider Order: No      Mobility  Bed Mobility Overal bed mobility: Modified Independent                  Transfers Overall transfer level: Needs assistance Equipment used: Rolling walker (2 wheels) Transfers: Sit to/from Stand Sit to Stand: Supervision, Contact guard assist                Ambulation/Gait Ambulation/Gait assistance: Supervision, Contact guard assist Gait Distance (Feet): 220 Feet Assistive device: Straight cane Gait Pattern/deviations: Step-to pattern, Drifts right/left Gait velocity: decreased     General Gait Details: mild unsteadiness; tends to stagger R  Stairs            Wheelchair Mobility     Tilt Bed    Modified Rankin (Stroke Patients Only)       Balance Overall balance assessment: Needs assistance   Sitting balance-Leahy Scale: Good   Postural control: Right lateral lean Standing balance support: During functional activity, Reliant on assistive device for balance Standing balance-Leahy Scale: Fair                               Pertinent Vitals/Pain Pain Assessment Pain Assessment: No/denies pain    Home Living                          Prior Function  Extremity/Trunk Assessment   Upper Extremity Assessment Upper Extremity Assessment: Generalized weakness    Lower Extremity Assessment Lower Extremity Assessment: Generalized weakness    Cervical / Trunk Assessment Cervical / Trunk Assessment: Normal  Communication   Communication Communication: No apparent difficulties    Cognition Arousal: Alert Behavior During Therapy: WFL for tasks assessed/performed   PT - Cognitive impairments: No apparent impairments                         Following commands: Intact       Cueing Cueing  Techniques: Verbal cues     General Comments      Exercises     Assessment/Plan    PT Assessment Patient needs continued PT services  PT Problem List Decreased strength;Decreased activity tolerance;Decreased mobility       PT Treatment Interventions Gait training;Stair training;Functional mobility training;Therapeutic activities;Therapeutic exercise;Balance training;Neuromuscular re-education;Patient/family education    PT Goals (Current goals can be found in the Care Plan section)  Acute Rehab PT Goals Patient Stated Goal: Pt wants to go home PT Goal Formulation: With patient Time For Goal Achievement: 09/08/24 Potential to Achieve Goals: Good    Frequency Min 2X/week     Co-evaluation               AM-PAC PT 6 Clicks Mobility  Outcome Measure Help needed turning from your back to your side while in a flat bed without using bedrails?: None Help needed moving from lying on your back to sitting on the side of a flat bed without using bedrails?: None Help needed moving to and from a bed to a chair (including a wheelchair)?: A Little Help needed standing up from a chair using your arms (e.g., wheelchair or bedside chair)?: A Little Help needed to walk in hospital room?: A Little Help needed climbing 3-5 steps with a railing? : A Little 6 Click Score: 20    End of Session Equipment Utilized During Treatment: Gait belt Activity Tolerance: Patient tolerated treatment well Patient left: in chair;with nursing/sitter in room;with call bell/phone within reach Nurse Communication: Mobility status PT Visit Diagnosis: Other abnormalities of gait and mobility (R26.89);Difficulty in walking, not elsewhere classified (R26.2)    Time: 1000-1022 PT Time Calculation (min) (ACUTE ONLY): 22 min   Charges:   PT Evaluation $PT Eval Low Complexity: 1 Low PT Treatments $Therapeutic Activity: 8-22 mins PT General Charges $$ ACUTE PT VISIT: 1 Visit        Sherlean Lesches  DPT, PT   Sherlean A Shenaya Lebo 08/18/2024, 1:37 PM

## 2024-08-18 NOTE — Plan of Care (Signed)
  Problem: Activity: Goal: Risk for activity intolerance will decrease Outcome: Progressing   Problem: Elimination: Goal: Will not experience complications related to bowel motility Outcome: Progressing Goal: Will not experience complications related to urinary retention Outcome: Progressing   Problem: Pain Managment: Goal: General experience of comfort will improve and/or be controlled Outcome: Progressing   Problem: Safety: Goal: Ability to remain free from injury will improve Outcome: Progressing   Problem: Skin Integrity: Goal: Risk for impaired skin integrity will decrease Outcome: Progressing

## 2024-08-18 NOTE — Progress Notes (Signed)
 Mobility Specialist Progress Note:    08/18/24 1410  Mobility  Activity Ambulated with assistance  Level of Assistance Minimal assist, patient does 75% or more  Assistive Device [employer] Ambulated (ft) 24 ft  Range of Motion/Exercises Active;All extremities  Activity Response Tolerated well  Mobility visit 1 Mobility  Mobility Specialist Start Time (ACUTE ONLY) 1340  Mobility Specialist Stop Time (ACUTE ONLY) 1408  Mobility Specialist Time Calculation (min) (ACUTE ONLY) 28 min   Pt received in bed, agreeable to mobility. Upon sitting EOB, linens and pt gown soiled. NT in room to assist with peri care. Pt required CGA/MinA to stand and ambulate with cane. Tolerated well, unsteady during ambulation. Pt c/o fatigue after ambulating to bathroom and refuses further ambulation at this time. Returned pt semi fowlers, alarm on and belongings in reach. All needs met.  Sherrilee Ditty Mobility Specialist Please contact via Special Educational Needs Teacher or  Rehab office at [phone]

## 2024-08-18 NOTE — Progress Notes (Signed)
  PROGRESS NOTE    ESTUS KRAKOWSKI  FMW:[id number] DOB: 1934-09-26 DOA: 08/15/2024 PCP: Joyice Kern, PA-C (Inactive)  239A/239A-AA  LOS: 3 days   Brief hospital course:   Assessment & Plan: Mr. Virani was admitted to the hospital with the working diagnosis of acute cholecystitis.    89 yo male with the past medical history of hypertension, hyperlipidemia, COPD and atrial fibrillation who presented with abdominal pain.  Reported intermittent abdominal pain for the last week prior to admission, symptoms started after Thanksgiving. On the day of admission he had worsening abdominal pain 7/10, prompting [facility] to call EMS. He was found in pain and in atrial fibrillation with rapid ventricular response. He has limited mobility confined to his home, uses a cane.    Severe sepsis 2/2 * Acute cholecystitis Per surgery no planned surgery.  --cont zosyn  --advance diet to low-fat   Atrial fibrillation with RVR (HCC) --started on IV amio due to hx of reduced EF --cont IV amio gtt --increase lopressor  to 25 BID --transition from heparin  gtt to Eliquis    Hypertension --hold home clonidine, olmesartan and amlodipine   Chronic systolic CHF (congestive heart failure) (HCC) 2020 echocardiogram with reduced LV systolic function 25 to 30%, positive LVH, with global hypokinesis, moderate to severe tricuspid valve regurgitation.  --Clinically with no signs of volume overload or decompensation --increase lopressor  to 25 BID  Acute kidney injury superimposed on stage 3b chronic kidney disease (HCC) --improved with IVF --oral hydration now   Hypokalemia --monitor and supplement PRN  COPD (chronic obstructive pulmonary disease) (HCC) No signs of acute exacerbation     DVT prophylaxis: On:Eliquis  Code Status: Full code  Family Communication:  Level of care: Progressive Dispo:   The patient is from: home Anticipated d/c is to: home Anticipated d/c date is: 1-2 days   Subjective and  Interval History:  RN and PT reported watery diarrhea.   Objective: Vitals:   08/18/24 0509 08/18/24 0829 08/18/24 1139 08/18/24 1622  BP: (!) 148/84 (!) 145/100 135/71 (!) 139/92  Pulse: (!) 106 90 (!) 103 95  Resp: 20  18 18   Temp: 98.3 F (36.8 C) 98.3 F (36.8 C) 98.3 F (36.8 C) 98.2 F (36.8 C)  TempSrc:      SpO2: 97% 98% 95% 96%  Weight:      Height:        Intake/Output Summary (Last 24 hours) at 08/18/2024 1908 Last data filed at 08/18/2024 9171 Gross per 24 hour  Intake 758.39 ml  Output 1100 ml  Net -341.61 ml   Filed Weights   08/15/24 1208 08/17/24 0500 08/18/24 0500  Weight: 75.4 kg 95.1 kg 74 kg    Examination:   Constitutional: NAD, alert, oriented HEENT: conjunctivae and lids normal, EOMI CV: No cyanosis.   RESP: normal respiratory effort, on RA   Data Reviewed: I have personally reviewed labs and imaging studies  Time spent: 35 minutes  Ellouise Haber, MD [facility] If 7PM-7AM, please contact night-coverage 08/18/2024, 7:08 PM

## 2024-08-19 DIAGNOSIS — K810 Acute cholecystitis: Secondary | ICD-10-CM | POA: Diagnosis not present

## 2024-08-19 LAB — GLUCOSE, CAPILLARY
Glucose-Capillary: 126 mg/dL — ABNORMAL HIGH (ref 70–99)
Glucose-Capillary: 125 mg/dL — ABNORMAL HIGH (ref 70–99)

## 2024-08-19 MED ORDER — METOPROLOL TARTRATE 50 MG PO TABS
50.0000 mg | ORAL_TABLET | Freq: Two times a day (BID) | ORAL | Status: DC
  Administered 2024-08-19: 50 mg via ORAL
  Filled 2024-08-19: qty 1

## 2024-08-19 MED ORDER — METOPROLOL TARTRATE 50 MG PO TABS
100.0000 mg | ORAL_TABLET | Freq: Two times a day (BID) | ORAL | Status: DC
  Administered 2024-08-19 – 2024-08-21 (×4): 100 mg via ORAL
  Filled 2024-08-19 (×4): qty 2

## 2024-08-19 NOTE — Plan of Care (Signed)
  Problem: Clinical Measurements: Goal: Diagnostic test results will improve Outcome: Progressing   Problem: Elimination: Goal: Will not experience complications related to bowel motility Outcome: Progressing   Problem: Nutrition: Goal: Adequate nutrition will be maintained Outcome: Progressing

## 2024-08-19 NOTE — Plan of Care (Signed)

## 2024-08-19 NOTE — Progress Notes (Signed)
  PROGRESS NOTE    [name] [name]  FMW:[id number] DOB: [birth year] DOA: 08/15/2024 PCP: Joyice Kern, PA-C (Inactive)  239A/239A-AA  LOS: 4 days   Brief hospital course:   Assessment & Plan: [name] [name] was admitted to the hospital with the working diagnosis of acute cholecystitis.    89 yo male with the past medical history of hypertension, hyperlipidemia, COPD and atrial fibrillation who presented with abdominal pain.  Reported intermittent abdominal pain for the last week prior to admission, symptoms started after Thanksgiving. On the day of admission he had worsening abdominal pain 7/10, prompting [facility] to call EMS. He was found in pain and in atrial fibrillation with rapid ventricular response. He has limited mobility confined to his home, uses a cane.    Severe sepsis 2/2 * Acute cholecystitis Per surgery no planned surgery.  --cont zosyn  --cont low-fat diet   Atrial fibrillation with RVR (HCC) --started on IV amio due to hx of reduced EF --cont IV amio gtt --increase lopressor  to 100 mg BID --cont Eliquis    Hypertension --hold home clonidine, olmesartan and amlodipine --cont Lopressor  for rate control   Chronic systolic CHF (congestive heart failure) (HCC) 2020 echocardiogram with reduced LV systolic function 25 to 30%, positive LVH, with global hypokinesis, moderate to severe tricuspid valve regurgitation.  --Clinically with no signs of volume overload or decompensation --increase lopressor  to 100 mg BID  Acute kidney injury superimposed on stage 3b chronic kidney disease (HCC) --improved with IVF --oral hydration now   Hypokalemia --monitor and supplement PRN  COPD (chronic obstructive pulmonary disease) (HCC) No signs of acute exacerbation     DVT prophylaxis: On:Eliquis  Code Status: Full code  Family Communication:  Level of care: Progressive Dispo:   The patient is from: home Anticipated d/c is to: home Anticipated d/c date is: 1-2  days   Subjective and Interval History:  No more diarrhea today, per RN.   Objective: Vitals:   08/19/24 0821 08/19/24 0832 08/19/24 1154 08/19/24 1659  BP:  (!) 142/86 128/75 128/83  Pulse:  (!) 102 (!) 107 93  Resp: 19 18 18 20   Temp:  98.3 F (36.8 C) 97.8 F (36.6 C) 99.5 F (37.5 C)  TempSrc:      SpO2:  98% 96% 97%  Weight:      Height:        Intake/Output Summary (Last 24 hours) at 08/19/2024 2015 Last data filed at 08/19/2024 1700 Gross per 24 hour  Intake 1888.02 ml  Output 1000 ml  Net 888.02 ml   Filed Weights   08/17/24 0500 08/18/24 0500 08/19/24 0456  Weight: 95.1 kg 74 kg 77 kg    Examination:   Constitutional: NAD CV: No cyanosis.   RESP: normal respiratory effort, on RA   Data Reviewed: I have personally reviewed labs and imaging studies  Time spent: 35 minutes  Ellouise Haber, MD [facility] If 7PM-7AM, please contact night-coverage 08/19/2024, 8:15 PM

## 2024-08-20 LAB — CULTURE, BLOOD (ROUTINE X 2)
Culture: NO GROWTH
Culture: NO GROWTH

## 2024-08-20 LAB — GLUCOSE, CAPILLARY
Glucose-Capillary: 155 mg/dL — ABNORMAL HIGH (ref 70–99)
Glucose-Capillary: 145 mg/dL — ABNORMAL HIGH (ref 70–99)
Glucose-Capillary: 141 mg/dL — ABNORMAL HIGH (ref 70–99)

## 2024-08-20 MED ORDER — AMOXICILLIN-POT CLAVULANATE 500-125 MG PO TABS
1.0000 | ORAL_TABLET | Freq: Two times a day (BID) | ORAL | Status: DC
  Administered 2024-08-20 – 2024-08-21 (×3): 1 via ORAL
  Filled 2024-08-20 (×3): qty 1

## 2024-08-20 NOTE — TOC Progression Note (Signed)
 Transition of Care [facility]) - Progression Note    Patient Details  Name: [name] [name] MRN: [id number] Date of Birth: 30-Nov-1934  [facility]) CM/SW Contact  Alfonso Rummer, LCSW Phone Number: 08/20/2024, 4:32 PM  Clinical Narrative:     [name] Rummer met with pt at bedside in room 239. [facility] is [facility]. [facility] will arrange hh pt and ot once pt is medically discharged.                    Expected Discharge Plan and Services                                               Social Drivers of Health (SDOH) Interventions SDOH Screenings   Food Insecurity: No Food Insecurity (08/16/2024)  Housing: Low Risk  (08/16/2024)  Transportation Needs: No Transportation Needs (08/16/2024)  Utilities: Not At Risk (08/16/2024)  Financial Resource Strain: Low Risk (04/13/2022)   [facility]  Social Connections: Moderately Integrated (08/16/2024)  Tobacco Use: High Risk (08/15/2024)    Readmission Risk Interventions     No data to display

## 2024-08-20 NOTE — Progress Notes (Signed)
  PROGRESS NOTE    [name] [name]  FMW:[id number] DOB: October 22, 1934 DOA: 08/15/2024 PCP: Joyice Kern, PA-C (Inactive)  239A/239A-AA  LOS: 5 days   Brief hospital course:   Assessment & Plan: [name] [name] was admitted to the hospital with the working diagnosis of acute cholecystitis.    89 yo male with the past medical history of hypertension, hyperlipidemia, COPD and atrial fibrillation who presented with abdominal pain.  Reported intermittent abdominal pain for the last week prior to admission, symptoms started after Thanksgiving. On the day of admission he had worsening abdominal pain 7/10, prompting [facility] to call EMS. He was found in pain and in atrial fibrillation with rapid ventricular response. He has limited mobility confined to his home, uses a cane.    Severe sepsis 2/2 * Acute cholecystitis Per surgery no planned surgery.  --transition from zosyn  to Augmentin  today --cont low-fat diet --outpatient f/u with [facility]   Atrial fibrillation with RVR (HCC) --started on IV amio due to hx of reduced EF --cont lopressor  100 mg BID --d/c amio gtt --cont Eliquis    Hypertension --hold home clonidine, olmesartan and amlodipine --cont lopressor  100 mg BID   Chronic systolic CHF (congestive heart failure) (HCC) 2020 echocardiogram with reduced LV systolic function 25 to 30%, positive LVH, with global hypokinesis, moderate to severe tricuspid valve regurgitation.  --Clinically with no signs of volume overload or decompensation --cont lopressor  100 mg BID  Acute kidney injury superimposed on stage 3b chronic kidney disease (HCC) --improved with IVF --oral hydration now   Hypokalemia --monitor and supplement PRN  COPD (chronic obstructive pulmonary disease) (HCC) No signs of acute exacerbation     DVT prophylaxis: On:Eliquis  Code Status: Full code  Family Communication: updated [facility] Dr. Eleanore  Level of care: Progressive Dispo:   The patient is from: home Anticipated  d/c is to: home Anticipated d/c date is: tomorrow   Subjective and Interval History:  Pt reported feeling much better today.  Denied more diarrhea, however when walking with mobility specialist, pt had incontinence of stools.   Objective: Vitals:   08/20/24 0431 08/20/24 0858 08/20/24 1227 08/20/24 1653  BP:  129/77 113/71 (!) 144/78  Pulse:  95 70 78  Resp:  17 16 18   Temp:  98.2 F (36.8 C) 97.8 F (36.6 C) 97.9 F (36.6 C)  TempSrc:  Oral    SpO2:  95% 99% 99%  Weight: 77.2 kg     Height:        Intake/Output Summary (Last 24 hours) at 08/20/2024 1910 Last data filed at 08/20/2024 1600 Gross per 24 hour  Intake 2065.07 ml  Output 825 ml  Net 1240.07 ml   Filed Weights   08/18/24 0500 08/19/24 0456 08/20/24 0431  Weight: 74 kg 77 kg 77.2 kg    Examination:   Constitutional: NAD, AAOx3 HEENT: conjunctivae and lids normal, EOMI CV: No cyanosis.   RESP: normal respiratory effort Neuro: II - XII grossly intact.   Psych: Normal mood and affect.  Appropriate judgement and reason   Data Reviewed: I have personally reviewed labs and imaging studies  Time spent: 35 minutes  Ellouise Haber, MD [facility] If 7PM-7AM, please contact night-coverage 08/20/2024, 7:10 PM

## 2024-08-20 NOTE — Plan of Care (Signed)
°  Problem: Clinical Measurements: °Goal: Diagnostic test results will improve °Outcome: Progressing °  °Problem: Activity: °Goal: Risk for activity intolerance will decrease °Outcome: Progressing °  °Problem: Nutrition: °Goal: Adequate nutrition will be maintained °Outcome: Progressing °  °Problem: Elimination: °Goal: Will not experience complications related to bowel motility °Outcome: Progressing °  °

## 2024-08-20 NOTE — Progress Notes (Signed)
 Mobility Specialist Progress Note:    08/20/24 1648  Mobility  Activity Ambulated with assistance  Level of Assistance Standby assist, set-up cues, supervision of patient - no hands on  Assistive Device Cane  Distance Ambulated (ft) 160 ft  Range of Motion/Exercises Active;All extremities  Activity Response Tolerated well  Mobility visit 1 Mobility  Mobility Specialist Start Time (ACUTE ONLY) 1622  Mobility Specialist Stop Time (ACUTE ONLY) 1643  Mobility Specialist Time Calculation (min) (ACUTE ONLY) 21 min   Pt received in bed, agreeable to mobility. Required SBA to stand and ambulate with cane. Tolerated well, pt also uses handrails and furniture surfs with hand not utilizing cane. Left pt supine, nurse at bedside. All needs met.  Sherrilee Ditty Mobility Specialist Please contact via Special Educational Needs Teacher or  Rehab office at [phone]

## 2024-08-21 LAB — GLUCOSE, CAPILLARY: Glucose-Capillary: 114 mg/dL — ABNORMAL HIGH (ref 70–99)

## 2024-08-21 MED ORDER — AMOXICILLIN-POT CLAVULANATE 500-125 MG PO TABS: 1.0000 | ORAL_TABLET | Freq: Two times a day (BID) | ORAL | Status: AC

## 2024-08-21 MED ORDER — METOPROLOL SUCCINATE ER 100 MG PO TB24: 150.0000 mg | ORAL_TABLET | Freq: Every day | ORAL | Status: AC

## 2024-08-21 NOTE — Discharge Summary (Addendum)
 Physician Discharge Summary   [name] [name]  male DOB: 03/20/1935  FMW:[id number]  PCP: Joyice Kern, PA-C (Inactive)  Admit date: 08/15/2024 Discharge date: 08/21/2024  Admitted From: home Disposition:  home with [facility] Home Health: Provided by [facility] CODE STATUS: Full code   Hospital Course:  For full details, please see H&P, progress notes, consult notes and ancillary notes.  Briefly,  [name] [name] is a 89 yo male with the past medical history of hypertension, COPD and atrial fibrillation who presented with abdominal pain.   Reported intermittent abdominal pain for the last week prior to admission.  On the day of admission he had worsening abdominal pain 7/10, prompting [facility] to call EMS. He was found in pain and in atrial fibrillation with rapid ventricular response. He has limited mobility confined to his home, uses a cane.    Severe sepsis 2/2 * Acute cholecystitis Per surgery no planned surgery.  --received 5 days of zosyn  and transitioned to Augmentin .  Pt is discharged to finish 4 more days of Augmentin . --outpatient follow up with GenSurg Dr. Marinda in 2-3 weeks   Atrial fibrillation with RVR (HCC) --initially started on IV amio due to hx of reduced EF, however, LVEF has now recovered to 50-55%.  Pt was started on metop instead and gradually titrated up for HR control.  IV amio was stopped prior to discharge.  Pt is discharged on Toprol  150 mg daily. --cont Eliquis    Hypertension --d/c'ed home clonidine, olmesartan and amlodipine --started on Toprol  150 mg daily   Chronic systolic CHF (congestive heart failure) (HCC) 2020 echocardiogram with reduced LV systolic function 25 to 30%.  Current Echo with recovered LVEF to  50-55%. --Clinically with no signs of volume overload or decompensation --started on Toprol  150 mg daily --resume Jardiance after discharge.   Acute kidney injury superimposed on stage 3b chronic kidney disease (HCC) --Cr 3/18  on presentation.  improved with IVF.  Cr 2.15 prior to discharge.   Hypokalemia --monitored and supplemented PRN   COPD (chronic obstructive pulmonary disease) (HCC) No signs of acute exacerbation    Discharge Diagnoses:  Principal Problem:   Acute cholecystitis Active Problems:   Atrial fibrillation with RVR (HCC)   Hypertension   Chronic systolic CHF (congestive heart failure) (HCC)   Acute kidney injury superimposed on stage 3b chronic kidney disease (HCC)   COPD (chronic obstructive pulmonary disease) (HCC)   30 Day Unplanned Readmission Risk Score    Flowsheet Row ED to Hosp-Admission (Current) from 08/15/2024 in [facility]  30 Day Unplanned Readmission Risk Score (%) 10.94 Filed at 08/21/2024 0801    This score is the patient's risk of an unplanned readmission within 30 days of being discharged (0 -100%). The score is based on dignosis, age, lab data, medications, orders, and past utilization.   Low:  0-14.9   Medium: 15-21.9   High: 22-29.9   Extreme: 30 and above         Discharge Instructions:  Allergies as of 08/21/2024   No Known Allergies      Medication List     STOP taking these medications    amLODipine 10 MG tablet Commonly known as: NORVASC   amlodipine-olmesartan 10-20 MG tablet Commonly known as: AZOR   cloNIDine 0.1 mg/24hr patch Commonly known as: CATAPRES - Dosed in mg/24 hr       TAKE these medications    amoxicillin -clavulanate 500-125 MG tablet Commonly known as: AUGMENTIN  Take 1 tablet  by mouth 2 (two) times daily for 4 days.   Ben Gay Ultra Strength 12-21-28 % Crea Generic drug: Camphor-Menthol-Methyl Sal Apply 1 Application topically 3 (three) times daily as needed (for muscle discomfort).   cyanocobalamin 1000 MCG tablet Commonly known as: VITAMIN B12 Take 1,000 mcg by mouth once a week. Fridays   Eliquis  2.5 MG Tabs tablet Generic drug: apixaban  Take 2.5 mg by mouth 2 (two) times daily.    ferrous sulfate 325 (65 FE) MG tablet Take 325 mg by mouth 3 (three) times a week. M, W, F   Jardiance 10 MG Tabs tablet Generic drug: empagliflozin Take 10 mg by mouth daily.   metoprolol  succinate 100 MG 24 hr tablet Commonly known as: TOPROL -XL Take 1.5 tablets (150 mg total) by mouth daily. Take with or immediately following a meal.   sodium bicarbonate  650 MG tablet Take 1,300 mg by mouth 2 (two) times daily.   Vaseline Gel Apply 1 Application topically as needed for dry skin.   vitamin D3 25 MCG tablet Commonly known as: CHOLECALCIFEROL Take 1,000 Units by mouth daily.         Follow-up Information     Joyice Kern, PA-C Follow up in 1 week(s).   Specialty: Physician Assistant Contact information: [address] [place] [address] [phone]         Marinda Jayson KIDD, MD Follow up in 3 week(s).   Specialty: General Surgery Contact information: [address] [place] [address] [phone]                 No Known Allergies   The results of significant diagnostics from this hospitalization (including imaging, microbiology, ancillary and laboratory) are listed below for reference.   Consultations:   Procedures/Studies: ECHOCARDIOGRAM COMPLETE Result Date: 08/16/2024    ECHOCARDIOGRAM REPORT   Patient Name:   [name] [name] Date of Exam: 08/16/2024 Medical Rec #:  [id number]         Height:       70.0 in Accession #:    [id number]        Weight:       166.2 lb Date of Birth:  [birth year]          BSA:          1.930 m Patient Age:    89 years          BP:           116/78 mmHg Patient Gender: M                 HR:           126 bpm. Exam Location:  [facility] Procedure: 2D Echo, Cardiac Doppler and Color Doppler (Both Spectral and Color            Flow Doppler were utilized during procedure). Indications:     CHF-Acute Diastolic I50.31  History:         Patient has prior history of Echocardiogram examinations, most                   recent 08/29/2019. CHF; Arrythmias:Atrial Fibrillation.  Sonographer:     Ashley McNeely-Sloane Referring Phys:  [id number] MORENE BATHE Diagnosing Phys: Evalene Lunger MD IMPRESSIONS  1. Left ventricular ejection fraction, by estimation, is 50 to 55%. The left ventricle has low normal function. The left ventricle has no regional wall motion abnormalities. Left ventricular diastolic parameters are indeterminate.  2. Right ventricular systolic function is normal.  The right ventricular size is normal. Tricuspid regurgitation signal is inadequate for assessing PA pressure.  3. The mitral valve is normal in structure. No evidence of mitral valve regurgitation. No evidence of mitral stenosis.  4. The aortic valve is normal in structure. Aortic valve regurgitation is mild. Aortic valve sclerosis is present, with no evidence of aortic valve stenosis.  5. The inferior vena cava is normal in size with greater than 50% respiratory variability, suggesting right atrial pressure of 3 mmHg.  6. Rhythm is atrial fibrillation, rate 106 to 140 bpm FINDINGS  Left Ventricle: Left ventricular ejection fraction, by estimation, is 50 to 55%. The left ventricle has low normal function. The left ventricle has no regional wall motion abnormalities. Strain was performed and the global longitudinal strain is indeterminate. The left ventricular internal cavity size was normal in size. There is no left ventricular hypertrophy. Left ventricular diastolic parameters are indeterminate. Right Ventricle: The right ventricular size is normal. No increase in right ventricular wall thickness. Right ventricular systolic function is normal. Tricuspid regurgitation signal is inadequate for assessing PA pressure. Left Atrium: Left atrial size was normal in size. Right Atrium: Right atrial size was normal in size. Pericardium: There is no evidence of pericardial effusion. Mitral Valve: The mitral valve is normal in structure. No evidence of mitral valve  regurgitation. No evidence of mitral valve stenosis. MV peak gradient, 3.9 mmHg. The mean mitral valve gradient is 1.0 mmHg. Tricuspid Valve: The tricuspid valve is normal in structure. Tricuspid valve regurgitation is not demonstrated. No evidence of tricuspid stenosis. Aortic Valve: The aortic valve is normal in structure. Aortic valve regurgitation is mild. Aortic valve sclerosis is present, with no evidence of aortic valve stenosis. Aortic valve mean gradient measures 3.0 mmHg. Aortic valve peak gradient measures 5.9  mmHg. Aortic valve area, by VTI measures 2.63 cm. Pulmonic Valve: The pulmonic valve was normal in structure. Pulmonic valve regurgitation is not visualized. No evidence of pulmonic stenosis. Aorta: The aortic root is normal in size and structure. Venous: The inferior vena cava is normal in size with greater than 50% respiratory variability, suggesting right atrial pressure of 3 mmHg. IAS/Shunts: No atrial level shunt detected by color flow Doppler. Additional Comments: 3D was performed not requiring image post processing on an independent workstation and was indeterminate.  LEFT VENTRICLE PLAX 2D LVIDd:         2.60 cm     Diastology LVIDs:         2.20 cm     LV e' medial:    5.11 cm/s LV PW:         1.90 cm     LV E/e' medial:  16.7 LV IVS:        1.70 cm     LV e' lateral:   8.05 cm/s LVOT diam:     2.10 cm     LV E/e' lateral: 10.6 LV SV:         45 LV SV Index:   23 LVOT Area:     3.46 cm LV IVRT:       32 msec  LV Volumes (MOD) LV vol d, MOD A4C: 46.4 ml LV vol s, MOD A4C: 21.4 ml LV SV MOD A4C:     46.4 ml RIGHT VENTRICLE             IVC RV Basal diam:  3.00 cm     IVC diam: 1.60 cm RV Mid diam:    2.00 cm RV  S prime:     12.80 cm/s TAPSE (M-mode): 1.3 cm LEFT ATRIUM             Index        RIGHT ATRIUM          Index LA diam:        3.30 cm 1.71 cm/m   RA Area:     9.89 cm LA Vol (A2C):   43.7 ml 22.65 ml/m  RA Volume:   14.90 ml 7.72 ml/m LA Vol (A4C):   32.4 ml 16.79 ml/m LA  Biplane Vol: 38.5 ml 19.95 ml/m  AORTIC VALVE                    PULMONIC VALVE AV Area (Vmax):    2.04 cm     PV Vmax:        0.96 m/s AV Area (Vmean):   1.67 cm     PV Vmean:       73.000 cm/s AV Area (VTI):     2.63 cm     PV VTI:         0.156 m AV Vmax:           121.00 cm/s  PV Peak grad:   3.7 mmHg AV Vmean:          87.600 cm/s  PV Mean grad:   2.0 mmHg AV VTI:            0.171 m      RVOT Peak grad: 1 mmHg AV Peak Grad:      5.9 mmHg AV Mean Grad:      3.0 mmHg LVOT Vmax:         71.10 cm/s LVOT Vmean:        42.300 cm/s LVOT VTI:          0.130 m LVOT/AV VTI ratio: 0.76  AORTA Ao Root diam: 3.40 cm MITRAL VALVE MV Area (PHT): 4.12 cm    SHUNTS MV Area VTI:   4.05 cm    Systemic VTI:  0.13 m MV Peak grad:  3.9 mmHg    Systemic Diam: 2.10 cm MV Mean grad:  1.0 mmHg    Pulmonic VTI:  0.080 m MV Vmax:       0.98 m/s MV Vmean:      51.0 cm/s MV Decel Time: 184 msec MV E velocity: 85.40 cm/s Evalene Lunger MD Electronically signed by Evalene Lunger MD Signature Date/Time: 08/16/2024/1:40:48 PM    Final    US  RENAL Result Date: 08/16/2024 CLINICAL DATA:  Acute kidney injury. EXAM: RENAL / URINARY TRACT ULTRASOUND COMPLETE COMPARISON:  August 15, 2024 FINDINGS: Right Kidney: Renal measurements: 9.4 cm x 5.0 cm x 4.2 cm = volume: 104.6 mL. There is diffusely increased echogenicity of the renal parenchyma. No mass or hydronephrosis visualized. Left Kidney: Renal measurements: 8.8 cm x 5.1 cm x 4.2 cm = volume: 98.0 mL. There is diffusely increased echogenicity of the renal parenchyma. 1.3 cm x 1.1 cm x 1.3 cm and 1.2 cm x 1.0 cm x 1.1 cm renal cysts are seen. No hydronephrosis is visualized. Bladder: Appears normal for degree of bladder distention. Other: A small amount of free fluid is seen along the lower pole of the left kidney. IMPRESSION: 1. Bilateral echogenic kidneys which may represent sequelae associated with medical renal disease. 2. Small left renal cyst. 3. Small amount of free fluid along the  lower pole of the left kidney. Electronically Signed  By: Suzen Dials M.D.   On: 08/16/2024 01:27   US  Abdomen Limited RUQ (LIVER/GB) Result Date: 08/16/2024 CLINICAL DATA:  Acute cholecystitis. EXAM: ULTRASOUND ABDOMEN LIMITED RIGHT UPPER QUADRANT COMPARISON:  None Available. FINDINGS: Gallbladder: Echogenic gallstones are seen within the gallbladder lumen (the largest measures a proximally 8 mm). The gallbladder is poorly distended with a gallbladder wall thickness of 7.3 mm. No sonographic Murphy sign noted by sonographer. Common bile duct: Diameter: 2.7 mm Liver: No focal lesion identified. Within normal limits in parenchymal echogenicity. Portal vein is patent on color Doppler imaging with normal direction of blood flow towards the liver. Other: None. IMPRESSION: Cholelithiasis, involving a poorly distended gallbladder with subsequent gallbladder wall thickening, without additional findings to suggest the presence of acute cholecystitis. Further evaluation with a nuclear medicine hepatobiliary scan is recommended if acute cholecystitis remains of clinical concern. Electronically Signed   By: Suzen Dials M.D.   On: 08/16/2024 01:14   CT ABDOMEN PELVIS WO CONTRAST Result Date: 08/15/2024 CLINICAL DATA:  Acute abdominal pain EXAM: CT ABDOMEN AND PELVIS WITHOUT CONTRAST TECHNIQUE: Multidetector CT imaging of the abdomen and pelvis was performed following the standard protocol without IV contrast. RADIATION DOSE REDUCTION: This exam was performed according to the departmental dose-optimization program which includes automated exposure control, adjustment of the mA and/or kV according to patient size and/or use of iterative reconstruction technique. COMPARISON:  None Available. FINDINGS: Lower chest: There is atelectasis in the right lung base. Hepatobiliary: The gallbladder is filled with gallstones. There is trace surrounding inflammatory stranding. There is no biliary ductal dilatation. The  liver is within normal limits. Pancreas: Unremarkable. No pancreatic ductal dilatation or surrounding inflammatory changes. Spleen: Normal in size without focal abnormality. Adrenals/Urinary Tract: Adrenal glands are unremarkable. Kidneys are normal, without renal calculi, focal lesion, or hydronephrosis. Bladder is unremarkable. Stomach/Bowel: There is some mild wall thickening and inflammation of the hepatic flexure. Otherwise, the colon appears within normal limits. There some borderline dilated loops of small bowel throughout the central abdomen without definitive transition point. The appendix appears normal. No pneumatosis or free air. The stomach is within normal limits. Vascular/Lymphatic: Aortic atherosclerosis. No enlarged abdominal or pelvic lymph nodes. Reproductive: Prostate is unremarkable. Other: No abdominal wall hernia or abnormality. No abdominopelvic ascites. Musculoskeletal: No acute or significant osseous findings. IMPRESSION: 1. Cholelithiasis with trace surrounding inflammatory stranding. Findings are concerning for acute cholecystitis. 2. Mild wall thickening and inflammation of the hepatic flexure, likely reactive. 3. Borderline dilated loops of small bowel throughout the central abdomen without definitive transition point. Findings may represent ileus. 4. Aortic atherosclerosis. Aortic Atherosclerosis (ICD10-I70.0). Electronically Signed   By: Greig Pique M.D.   On: 08/15/2024 16:13      Labs: BNP (last 3 results) No results for input(s): BNP in the last 8760 hours. Basic Metabolic Panel: Recent Labs  Lab 08/15/24 1353 08/15/24 1605 08/16/24 0506 08/17/24 0435 08/18/24 0450  NA 142  --  140 139 136  K 4.1  --  3.4* 4.1 3.8  CL 106  --  104 104 103  CO2 21*  --  21* 21* 19*  GLUCOSE 217*  --  156* 145* 135*  BUN 34*  --  39* 34* 27*  CREATININE 3.18*  --  2.96* 2.51* 2.15*  CALCIUM  8.3*  --  8.7* 8.5* 8.4*  MG 2.2 2.5*  --  2.3  --    Liver Function  Tests: Recent Labs  Lab 08/15/24 1353 08/17/24 0435  AST 16 27  ALT 9 12  ALKPHOS 63 68  BILITOT 0.8 0.4  PROT 7.3 6.8  ALBUMIN 3.4* 3.0*   Recent Labs  Lab 08/15/24 1353  LIPASE <10*   No results for input(s): AMMONIA in the last 168 hours. CBC: Recent Labs  Lab 08/15/24 1220 08/16/24 0506 08/17/24 0435 08/18/24 0450  WBC 18.5* 15.0* 12.3* 12.8*  NEUTROABS 16.0*  --  9.3*  --   HGB 13.9 11.6* 12.4* 12.1*  HCT 42.4 34.9* 37.8* 35.7*  MCV 85.7 84.3 85.1 82.6  PLT 214 186 191 185   Cardiac Enzymes: No results for input(s): CKTOTAL, CKMB, CKMBINDEX, TROPONINI in the last 168 hours. BNP: Invalid input(s): POCBNP CBG: Recent Labs  Lab 08/19/24 1700 08/20/24 0901 08/20/24 1228 08/20/24 1655 08/21/24 0821  GLUCAP 125* 155* 145* 141* 114*   D-Dimer No results for input(s): DDIMER in the last 72 hours. Hgb A1c No results for input(s): HGBA1C in the last 72 hours. Lipid Profile No results for input(s): CHOL, HDL, LDLCALC, TRIG, CHOLHDL, LDLDIRECT in the last 72 hours. Thyroid  function studies No results for input(s): TSH, T4TOTAL, T3FREE, THYROIDAB in the last 72 hours.  Invalid input(s): FREET3 Anemia work up No results for input(s): VITAMINB12, FOLATE, FERRITIN, TIBC, IRON, RETICCTPCT in the last 72 hours. Urinalysis    Component Value Date/Time   COLORURINE YELLOW (A) 08/15/2024 1402   APPEARANCEUR HAZY (A) 08/15/2024 1402   LABSPEC 1.018 08/15/2024 1402   PHURINE 5.0 08/15/2024 1402   GLUCOSEU >=500 (A) 08/15/2024 1402   HGBUR MODERATE (A) 08/15/2024 1402   BILIRUBINUR NEGATIVE 08/15/2024 1402   KETONESUR NEGATIVE 08/15/2024 1402   PROTEINUR 30 (A) 08/15/2024 1402   NITRITE NEGATIVE 08/15/2024 1402   LEUKOCYTESUR TRACE (A) 08/15/2024 1402   Sepsis Labs Recent Labs  Lab 08/15/24 1220 08/16/24 0506 08/17/24 0435 08/18/24 0450  WBC 18.5* 15.0* 12.3* 12.8*   Microbiology Recent Results (from the  past 240 hours)  Urine Culture     Status: Abnormal   Collection Time: 08/15/24  2:02 PM   Specimen: Urine, Random  Result Value Ref Range Status   Specimen Description   Final    URINE, RANDOM Performed at [facility], [address] [place], [place] [address]    Special Requests   Final    NONE Reflexed from [id number] Performed at [facility], [address] [place], [place] [address]    Culture >=100,000 COLONIES/mL ESCHERICHIA COLI (A)  Final   Report Status 08/17/2024 FINAL  Final   Organism ID, Bacteria ESCHERICHIA COLI (A)  Final      Susceptibility   Escherichia coli - MIC*    AMPICILLIN 4 SENSITIVE Sensitive     CEFAZOLIN (URINE) Value in next row Sensitive      <=1 SENSITIVEThis is a modified FDA-approved test that has been validated and its performance characteristics determined by the reporting laboratory.  This laboratory is certified under the Clinical Laboratory Improvement Amendments CLIA as qualified to perform high complexity clinical laboratory testing.    CEFEPIME  Value in next row Sensitive      <=1 SENSITIVEThis is a modified FDA-approved test that has been validated and its performance characteristics determined by the reporting laboratory.  This laboratory is certified under the Clinical Laboratory Improvement Amendments CLIA as qualified to perform high complexity clinical laboratory testing.    ERTAPENEM Value in next row Sensitive      <=1 SENSITIVEThis is a modified FDA-approved test that has been validated and its performance characteristics determined by  the reporting laboratory.  This laboratory is certified under the Clinical Laboratory Improvement Amendments CLIA as qualified to perform high complexity clinical laboratory testing.    CEFTRIAXONE  Value in next row Sensitive      <=1 SENSITIVEThis is a modified FDA-approved test that has been validated and its performance characteristics determined by the reporting laboratory.  This  laboratory is certified under the Clinical Laboratory Improvement Amendments CLIA as qualified to perform high complexity clinical laboratory testing.    CIPROFLOXACIN Value in next row Sensitive      <=1 SENSITIVEThis is a modified FDA-approved test that has been validated and its performance characteristics determined by the reporting laboratory.  This laboratory is certified under the Clinical Laboratory Improvement Amendments CLIA as qualified to perform high complexity clinical laboratory testing.    GENTAMICIN Value in next row Sensitive      <=1 SENSITIVEThis is a modified FDA-approved test that has been validated and its performance characteristics determined by the reporting laboratory.  This laboratory is certified under the Clinical Laboratory Improvement Amendments CLIA as qualified to perform high complexity clinical laboratory testing.    NITROFURANTOIN Value in next row Sensitive      <=1 SENSITIVEThis is a modified FDA-approved test that has been validated and its performance characteristics determined by the reporting laboratory.  This laboratory is certified under the Clinical Laboratory Improvement Amendments CLIA as qualified to perform high complexity clinical laboratory testing.    TRIMETH/SULFA Value in next row Sensitive      <=1 SENSITIVEThis is a modified FDA-approved test that has been validated and its performance characteristics determined by the reporting laboratory.  This laboratory is certified under the Clinical Laboratory Improvement Amendments CLIA as qualified to perform high complexity clinical laboratory testing.    AMPICILLIN/SULBACTAM Value in next row Sensitive      <=1 SENSITIVEThis is a modified FDA-approved test that has been validated and its performance characteristics determined by the reporting laboratory.  This laboratory is certified under the Clinical Laboratory Improvement Amendments CLIA as qualified to perform high complexity clinical laboratory  testing.    PIP/TAZO Value in next row Sensitive      <=4 SENSITIVEThis is a modified FDA-approved test that has been validated and its performance characteristics determined by the reporting laboratory.  This laboratory is certified under the Clinical Laboratory Improvement Amendments CLIA as qualified to perform high complexity clinical laboratory testing.    MEROPENEM Value in next row Sensitive      <=4 SENSITIVEThis is a modified FDA-approved test that has been validated and its performance characteristics determined by the reporting laboratory.  This laboratory is certified under the Clinical Laboratory Improvement Amendments CLIA as qualified to perform high complexity clinical laboratory testing.    * >=100,000 COLONIES/mL ESCHERICHIA COLI  Blood Culture (routine x 2)     Status: None   Collection Time: 08/15/24  4:05 PM   Specimen: BLOOD  Result Value Ref Range Status   Specimen Description BLOOD BLOOD RIGHT ARM  Final   Special Requests   Final    BOTTLES DRAWN AEROBIC AND ANAEROBIC Blood Culture results may not be optimal due to an inadequate volume of blood received in culture bottles   Culture   Final    NO GROWTH 5 DAYS Performed at [facility], [address] [place], [place] [address]    Report Status 08/20/2024 FINAL  Final  Blood Culture (routine x 2)     Status: None   Collection Time: 08/15/24  4:05  PM   Specimen: BLOOD  Result Value Ref Range Status   Specimen Description BLOOD BLOOD LEFT ARM  Final   Special Requests   Final    BOTTLES DRAWN AEROBIC AND ANAEROBIC Blood Culture results may not be optimal due to an inadequate volume of blood received in culture bottles   Culture   Final    NO GROWTH 5 DAYS Performed at [facility], [address] [place], [place] [address]    Report Status 08/20/2024 FINAL  Final     Total time spend on discharging this patient, including the last patient exam, discussing the hospital stay, instructions for  ongoing care as it relates to all pertinent caregivers, as well as preparing the medical discharge records, prescriptions, and/or referrals as applicable, is 35 minutes.    Ellouise Haber, MD  [facility] 08/21/2024, 12:09 PM

## 2024-08-21 NOTE — Progress Notes (Signed)
 Discharges instructions printed for [facility], and copy for pt also. Reviewed discharge instructions with pt. Pt verbalizes understanding. IV removed. Tele removed. Pt has belongings. Pt transported via wheelchair to lobby where [facility] personnel picked up

## 2024-08-21 NOTE — Plan of Care (Signed)

## 2024-08-21 NOTE — Plan of Care (Signed)
  Problem: Fluid Volume: Goal: Hemodynamic stability will improve 08/21/2024 1118 by Norah Fick K, RN Outcome: Adequate for Discharge 08/21/2024 1043 by Freeda Leon POUR, RN Outcome: Progressing   Problem: Clinical Measurements: Goal: Diagnostic test results will improve Outcome: Adequate for Discharge Goal: Signs and symptoms of infection will decrease 08/21/2024 1118 by Freeda Leon POUR, RN Outcome: Adequate for Discharge 08/21/2024 1043 by Freeda Leon POUR, RN Outcome: Progressing   Problem: Respiratory: Goal: Ability to maintain adequate ventilation will improve 08/21/2024 1118 by Freeda Leon POUR, RN Outcome: Adequate for Discharge 08/21/2024 1043 by Freeda Leon POUR, RN Outcome: Progressing   Problem: Education: Goal: Knowledge of General Education information will improve Description: Including pain rating scale, medication(s)/side effects and non-pharmacologic comfort measures Outcome: Adequate for Discharge   Problem: Health Behavior/Discharge Planning: Goal: Ability to manage health-related needs will improve Outcome: Adequate for Discharge   Problem: Clinical Measurements: Goal: Ability to maintain clinical measurements within normal limits will improve Outcome: Adequate for Discharge Goal: Will remain free from infection Outcome: Adequate for Discharge Goal: Diagnostic test results will improve Outcome: Adequate for Discharge Goal: Respiratory complications will improve Outcome: Adequate for Discharge Goal: Cardiovascular complication will be avoided Outcome: Adequate for Discharge   Problem: Activity: Goal: Risk for activity intolerance will decrease Outcome: Adequate for Discharge   Problem: Nutrition: Goal: Adequate nutrition will be maintained Outcome: Adequate for Discharge   Problem: Coping: Goal: Level of anxiety will decrease Outcome: Adequate for Discharge   Problem: Elimination: Goal: Will not experience complications related to bowel  motility Outcome: Adequate for Discharge Goal: Will not experience complications related to urinary retention Outcome: Adequate for Discharge   Problem: Pain Managment: Goal: General experience of comfort will improve and/or be controlled Outcome: Adequate for Discharge   Problem: Safety: Goal: Ability to remain free from injury will improve Outcome: Adequate for Discharge   Problem: Skin Integrity: Goal: Risk for impaired skin integrity will decrease Outcome: Adequate for Discharge

## 2024-08-21 NOTE — TOC Transition Note (Addendum)
 Transition of Care [facility]) - Discharge Note   Patient Details  Name: [name] [name] MRN: [id number] Date of Birth: Aug 02, 1935  [facility]) CM/SW Contact:  Lauraine [name], LCSW Phone Number: 08/21/2024, 11:52 AM   Clinical Narrative: Patient has orders to discharge home today. Joen Cypress, MD at [facility] is aware. She will arrange transportation for between 1:00-2:00 to give time for RN to get him ready and MD to complete discharge paperwork. Patient is aware. Left voicemail for niece. No further concerns. CSW signing off.    12:09 pm: Received call back from niece. Provided update.  Final next level of care: Home/Self Care Barriers to Discharge: Barriers Resolved   Patient Goals and CMS Choice            Discharge Placement                Patient to be transferred to facility by: [facility] Name of family member notified: Left voicemail for [facility] Patient and family notified of of transfer: 08/21/24  Discharge Plan and Services Additional resources added to the After Visit Summary for                                       Social Drivers of Health (SDOH) Interventions SDOH Screenings   Food Insecurity: No Food Insecurity (08/16/2024)  Housing: Low Risk  (08/16/2024)  Transportation Needs: No Transportation Needs (08/16/2024)  Utilities: Not At Risk (08/16/2024)  Financial Resource Strain: Low Risk (04/13/2022)   Received from [facility]  Social Connections: Moderately Integrated (08/16/2024)  Tobacco Use: High Risk (08/15/2024)     Readmission Risk Interventions     No data to display

## 2024-08-21 NOTE — Plan of Care (Signed)
  Problem: Fluid Volume: Goal: Hemodynamic stability will improve Outcome: Progressing   Problem: Clinical Measurements: Goal: Signs and symptoms of infection will decrease Outcome: Progressing   Problem: Respiratory: Goal: Ability to maintain adequate ventilation will improve Outcome: Progressing
